# Patient Record
Sex: Male | Born: 1945
Health system: Southern US, Community
[De-identification: ages and names within clinical notes are randomized; demographics above are authoritative.]

## PROBLEM LIST (undated history)

## (undated) DIAGNOSIS — N400 Enlarged prostate without lower urinary tract symptoms: Secondary | ICD-10-CM

## (undated) DIAGNOSIS — M1990 Unspecified osteoarthritis, unspecified site: Secondary | ICD-10-CM

## (undated) DIAGNOSIS — T7840XA Allergy, unspecified, initial encounter: Secondary | ICD-10-CM

## (undated) DIAGNOSIS — E079 Disorder of thyroid, unspecified: Secondary | ICD-10-CM

## (undated) DIAGNOSIS — K2270 Barrett's esophagus without dysplasia: Secondary | ICD-10-CM

## (undated) DIAGNOSIS — N529 Male erectile dysfunction, unspecified: Secondary | ICD-10-CM

## (undated) DIAGNOSIS — H9319 Tinnitus, unspecified ear: Secondary | ICD-10-CM

## (undated) DIAGNOSIS — G709 Myoneural disorder, unspecified: Secondary | ICD-10-CM

## (undated) DIAGNOSIS — M545 Low back pain, unspecified: Secondary | ICD-10-CM

## (undated) DIAGNOSIS — K219 Gastro-esophageal reflux disease without esophagitis: Secondary | ICD-10-CM

## (undated) DIAGNOSIS — I10 Essential (primary) hypertension: Secondary | ICD-10-CM

## (undated) DIAGNOSIS — G8929 Other chronic pain: Secondary | ICD-10-CM

## (undated) DIAGNOSIS — J310 Chronic rhinitis: Secondary | ICD-10-CM

## (undated) HISTORY — DX: Low back pain, unspecified: M54.50

## (undated) HISTORY — DX: Benign prostatic hyperplasia without lower urinary tract symptoms: N40.0

## (undated) HISTORY — DX: Chronic rhinitis: J31.0

## (undated) HISTORY — DX: Allergy, unspecified, initial encounter: T78.40XA

## (undated) HISTORY — DX: Essential (primary) hypertension: I10

## (undated) HISTORY — PX: ESOPHAGECTOMY: SUR457

## (undated) HISTORY — DX: Male erectile dysfunction, unspecified: N52.9

## (undated) HISTORY — DX: Myoneural disorder, unspecified: G70.9

## (undated) HISTORY — DX: Barrett's esophagus without dysplasia: K22.70

## (undated) HISTORY — DX: Disorder of thyroid, unspecified: E07.9

## (undated) HISTORY — DX: Other chronic pain: G89.29

## (undated) HISTORY — PX: EYE SURGERY: SHX253

## (undated) HISTORY — PX: HERNIA REPAIR: SHX51

## (undated) HISTORY — PX: SPINE SURGERY: SHX786

---

## 2009-06-18 DIAGNOSIS — T7840XA Allergy, unspecified, initial encounter: Secondary | ICD-10-CM | POA: Insufficient documentation

## 2011-06-28 DIAGNOSIS — Z23 Encounter for immunization: Secondary | ICD-10-CM | POA: Diagnosis not present

## 2011-10-25 DIAGNOSIS — R21 Rash and other nonspecific skin eruption: Secondary | ICD-10-CM | POA: Diagnosis not present

## 2011-10-25 DIAGNOSIS — D485 Neoplasm of uncertain behavior of skin: Secondary | ICD-10-CM | POA: Diagnosis not present

## 2011-12-31 DIAGNOSIS — R21 Rash and other nonspecific skin eruption: Secondary | ICD-10-CM | POA: Diagnosis not present

## 2011-12-31 DIAGNOSIS — M5137 Other intervertebral disc degeneration, lumbosacral region: Secondary | ICD-10-CM | POA: Diagnosis not present

## 2011-12-31 DIAGNOSIS — M545 Low back pain, unspecified: Secondary | ICD-10-CM | POA: Diagnosis not present

## 2011-12-31 DIAGNOSIS — R7301 Impaired fasting glucose: Secondary | ICD-10-CM | POA: Diagnosis not present

## 2011-12-31 DIAGNOSIS — K219 Gastro-esophageal reflux disease without esophagitis: Secondary | ICD-10-CM | POA: Diagnosis not present

## 2011-12-31 DIAGNOSIS — H9190 Unspecified hearing loss, unspecified ear: Secondary | ICD-10-CM | POA: Diagnosis not present

## 2011-12-31 DIAGNOSIS — E785 Hyperlipidemia, unspecified: Secondary | ICD-10-CM | POA: Diagnosis not present

## 2012-01-08 DIAGNOSIS — H905 Unspecified sensorineural hearing loss: Secondary | ICD-10-CM | POA: Diagnosis not present

## 2012-01-08 DIAGNOSIS — H9319 Tinnitus, unspecified ear: Secondary | ICD-10-CM | POA: Diagnosis not present

## 2012-02-26 DIAGNOSIS — M545 Low back pain, unspecified: Secondary | ICD-10-CM | POA: Diagnosis not present

## 2012-03-03 DIAGNOSIS — M999 Biomechanical lesion, unspecified: Secondary | ICD-10-CM | POA: Diagnosis not present

## 2012-03-03 DIAGNOSIS — M62838 Other muscle spasm: Secondary | ICD-10-CM | POA: Diagnosis not present

## 2012-03-03 DIAGNOSIS — M5137 Other intervertebral disc degeneration, lumbosacral region: Secondary | ICD-10-CM | POA: Diagnosis not present

## 2012-03-10 DIAGNOSIS — M545 Low back pain, unspecified: Secondary | ICD-10-CM | POA: Diagnosis not present

## 2012-03-12 ENCOUNTER — Other Ambulatory Visit: Payer: Self-pay | Admitting: Sports Medicine

## 2012-03-12 DIAGNOSIS — IMO0002 Reserved for concepts with insufficient information to code with codable children: Secondary | ICD-10-CM | POA: Diagnosis not present

## 2012-03-12 DIAGNOSIS — M545 Low back pain, unspecified: Secondary | ICD-10-CM

## 2012-03-12 DIAGNOSIS — M79604 Pain in right leg: Secondary | ICD-10-CM

## 2012-03-14 ENCOUNTER — Ambulatory Visit
Admission: RE | Admit: 2012-03-14 | Discharge: 2012-03-14 | Disposition: A | Discharge status: Home / Self Care | Payer: Medicare Other | Source: Ambulatory Visit | Attending: Sports Medicine | Admitting: Sports Medicine

## 2012-03-14 DIAGNOSIS — M545 Low back pain, unspecified: Secondary | ICD-10-CM | POA: Diagnosis not present

## 2012-03-14 DIAGNOSIS — M79604 Pain in right leg: Secondary | ICD-10-CM

## 2012-03-14 DIAGNOSIS — IMO0002 Reserved for concepts with insufficient information to code with codable children: Secondary | ICD-10-CM | POA: Diagnosis not present

## 2012-03-19 DIAGNOSIS — M5106 Intervertebral disc disorders with myelopathy, lumbar region: Secondary | ICD-10-CM | POA: Diagnosis not present

## 2012-03-21 ENCOUNTER — Inpatient Hospital Stay (HOSPITAL_COMMUNITY): Payer: Medicare Other

## 2012-03-21 ENCOUNTER — Inpatient Hospital Stay (HOSPITAL_COMMUNITY)
Admission: AD | Admit: 2012-03-21 | Discharge: 2012-03-22 | DRG: 491 | Disposition: A | Discharge status: Home / Self Care | Payer: Medicare Other | Source: Ambulatory Visit | Attending: Neurological Surgery | Admitting: Neurological Surgery

## 2012-03-21 ENCOUNTER — Encounter (HOSPITAL_COMMUNITY): Payer: Self-pay | Admitting: Anesthesiology

## 2012-03-21 ENCOUNTER — Encounter (HOSPITAL_COMMUNITY): Payer: Self-pay

## 2012-03-21 ENCOUNTER — Other Ambulatory Visit: Payer: Self-pay | Admitting: Neurological Surgery

## 2012-03-21 DIAGNOSIS — Z01811 Encounter for preprocedural respiratory examination: Secondary | ICD-10-CM | POA: Diagnosis not present

## 2012-03-21 DIAGNOSIS — M5126 Other intervertebral disc displacement, lumbar region: Secondary | ICD-10-CM | POA: Diagnosis not present

## 2012-03-21 DIAGNOSIS — IMO0002 Reserved for concepts with insufficient information to code with codable children: Secondary | ICD-10-CM | POA: Diagnosis not present

## 2012-03-21 DIAGNOSIS — K219 Gastro-esophageal reflux disease without esophagitis: Secondary | ICD-10-CM | POA: Diagnosis not present

## 2012-03-21 HISTORY — DX: Gastro-esophageal reflux disease without esophagitis: K21.9

## 2012-03-21 HISTORY — DX: Tinnitus, unspecified ear: H93.19

## 2012-03-21 HISTORY — DX: Unspecified osteoarthritis, unspecified site: M19.90

## 2012-03-21 LAB — CBC
HCT: 45 % (ref 39.0–52.0)
HCT: 48.2 % (ref 39.0–52.0)
Hemoglobin: 16.4 g/dL (ref 13.0–17.0)
Hemoglobin: 17.4 g/dL — ABNORMAL HIGH (ref 13.0–17.0)
MCH: 32.7 pg (ref 26.0–34.0)
MCH: 32.3 pg (ref 26.0–34.0)
MCHC: 36.4 g/dL — ABNORMAL HIGH (ref 30.0–36.0)
MCHC: 36.1 g/dL — ABNORMAL HIGH (ref 30.0–36.0)
MCV: 89.8 fL (ref 78.0–100.0)
MCV: 89.6 fL (ref 78.0–100.0)
Platelets: 220 10*3/uL (ref 150–400)
Platelets: 240 10*3/uL (ref 150–400)
RBC: 5.01 MIL/uL (ref 4.22–5.81)
RBC: 5.38 MIL/uL (ref 4.22–5.81)
RDW: 12.3 % (ref 11.5–15.5)
RDW: 12.2 % (ref 11.5–15.5)
WBC: 17.7 10*3/uL — ABNORMAL HIGH (ref 4.0–10.5)
WBC: 18.2 10*3/uL — ABNORMAL HIGH (ref 4.0–10.5)

## 2012-03-21 LAB — BASIC METABOLIC PANEL
BUN: 31 mg/dL — ABNORMAL HIGH (ref 6–23)
BUN: 27 mg/dL — ABNORMAL HIGH (ref 6–23)
CO2: 30 mEq/L (ref 19–32)
CO2: 31 mEq/L (ref 19–32)
Calcium: 9.8 mg/dL (ref 8.4–10.5)
Calcium: 9.7 mg/dL (ref 8.4–10.5)
Chloride: 99 mEq/L (ref 96–112)
Chloride: 100 mEq/L (ref 96–112)
Creatinine, Ser: 0.86 mg/dL (ref 0.50–1.35)
Creatinine, Ser: 0.89 mg/dL (ref 0.50–1.35)
GFR calc Af Amer: >90 mL/min (ref >90)
GFR calc Af Amer: >90 mL/min (ref >90)
GFR calc non Af Amer: 89 mL/min — ABNORMAL LOW (ref >90)
GFR calc non Af Amer: 88 mL/min — ABNORMAL LOW (ref >90)
Glucose, Bld: 99 mg/dL (ref 70–99)
Glucose, Bld: 114 mg/dL — ABNORMAL HIGH (ref 70–99)
Potassium: 4.5 mEq/L (ref 3.5–5.1)
Potassium: 4.5 mEq/L (ref 3.5–5.1)
Sodium: 136 mEq/L (ref 135–145)
Sodium: 138 mEq/L (ref 135–145)

## 2012-03-21 LAB — SURGICAL PCR SCREEN
MRSA, PCR: NEGATIVE
Staphylococcus aureus: NEGATIVE

## 2012-03-21 LAB — PROTIME-INR
INR: 1.08 (ref 0.00–1.49)
Prothrombin Time: 13.9 seconds (ref 11.6–15.2)

## 2012-03-21 MED ORDER — DEXAMETHASONE SODIUM PHOSPHATE 10 MG/ML IJ SOLN
INTRAMUSCULAR | Status: AC
  Filled 2012-03-21: qty 1

## 2012-03-21 MED ORDER — SODIUM CHLORIDE 0.9 % IV SOLN
INTRAVENOUS | Status: DC
  Administered 2012-03-21: 22:00:00 via INTRAVENOUS

## 2012-03-21 MED ORDER — MORPHINE SULFATE 2 MG/ML IJ SOLN
2.0000 mg | INTRAMUSCULAR | Status: DC | PRN
  Administered 2012-03-21 – 2012-03-22 (×3): 2 mg via INTRAVENOUS
  Filled 2012-03-21 (×3): qty 1

## 2012-03-21 MED ORDER — OXYCODONE HCL 5 MG PO TABS
5.0000 mg | ORAL_TABLET | ORAL | Status: DC | PRN
  Administered 2012-03-21: 5 mg via ORAL
  Filled 2012-03-21: qty 1

## 2012-03-21 MED ORDER — ONDANSETRON HCL 4 MG/2ML IJ SOLN: 4.0000 mg | Freq: Four times a day (QID) | INTRAMUSCULAR | Status: DC | PRN

## 2012-03-21 MED ORDER — DEXAMETHASONE SODIUM PHOSPHATE 4 MG/ML IJ SOLN
4.0000 mg | Freq: Four times a day (QID) | INTRAMUSCULAR | Status: DC
  Administered 2012-03-21 – 2012-03-22 (×2): 4 mg via INTRAVENOUS
  Filled 2012-03-21 (×3): qty 1

## 2012-03-21 MED ORDER — ACETAMINOPHEN 10 MG/ML IV SOLN
1000.0000 mg | Freq: Four times a day (QID) | INTRAVENOUS | Status: AC
  Administered 2012-03-21 – 2012-03-22 (×4): 1000 mg via INTRAVENOUS
  Filled 2012-03-21 (×4): qty 100

## 2012-03-21 MED ORDER — MUPIROCIN 2 % EX OINT
TOPICAL_OINTMENT | Freq: Once | CUTANEOUS | Status: AC
  Administered 2012-03-21: 1 via NASAL

## 2012-03-21 MED ORDER — CEFAZOLIN SODIUM-DEXTROSE 2-3 GM-% IV SOLR
INTRAVENOUS | Status: AC
  Administered 2012-03-22: 2 g via INTRAVENOUS
  Filled 2012-03-21: qty 50

## 2012-03-21 MED ORDER — CEFAZOLIN SODIUM-DEXTROSE 2-3 GM-% IV SOLR
2.0000 g | INTRAVENOUS | Status: DC
  Filled 2012-03-21: qty 50

## 2012-03-21 MED ORDER — MUPIROCIN 2 % EX OINT
TOPICAL_OINTMENT | CUTANEOUS | Status: AC
  Filled 2012-03-21: qty 22

## 2012-03-21 NOTE — Anesthesia Preprocedure Evaluation (Addendum)
Anesthesia Evaluation  Patient identified by MRN, date of birth, ID band Patient awake    Reviewed: Allergy & Precautions, H&P , NPO status , Patient's Chart, lab work & pertinent test results  Airway Mallampati: I TM Distance: >3 FB Neck ROM: Full    Dental  (+) Teeth Intact and Dental Advisory Given   Pulmonary neg pulmonary ROS,  breath sounds clear to auscultation  Pulmonary exam normal       Cardiovascular negative cardio ROS  Rhythm:Regular Rate:Normal     Neuro/Psych  Neuromuscular disease negative neurological ROS  negative psych ROS   GI/Hepatic Neg liver ROS, GERD-  Medicated and Poorly Controlled,H/o esophagectomy for Barrett's esophagus   Endo/Other  negative endocrine ROS  Renal/GU negative Renal ROS     Musculoskeletal  (+) Arthritis -, Osteoarthritis,    Abdominal   Peds  Hematology negative hematology ROS (+)   Anesthesia Other Findings   Reproductive/Obstetrics                         Anesthesia Physical Anesthesia Plan  ASA: II  Anesthesia Plan: General   Post-op Pain Management:    Induction: Intravenous  Airway Management Planned: Oral ETT  Additional Equipment:   Intra-op Plan:   Post-operative Plan: Extubation in OR  Informed Consent: I have reviewed the patients History and Physical, chart, labs and discussed the procedure including the risks, benefits and alternatives for the proposed anesthesia with the patient or authorized representative who has indicated his/her understanding and acceptance.   Dental advisory given  Plan Discussed with: CRNA and Surgeon  Anesthesia Plan Comments: (Plan routine monitors, GETA)        Anesthesia Quick Evaluation

## 2012-03-21 NOTE — Progress Notes (Signed)
Pt. Reports last ekg was probably done 10+ yrs. Ago, in [place].  Pt. Denies ever having seen a cardiologist.

## 2012-03-21 NOTE — Progress Notes (Signed)
Call to [facility] at Dr. Yetta Barre office, requested that Dr. Caralyn Guile his orders.

## 2012-03-21 NOTE — Preoperative (Addendum)
Beta Blockers   Reason not to administer Beta Blockers:Not Applicable 

## 2012-03-21 NOTE — H&P (Signed)
Subjective: Patient is a 66 y.o. male admitted for R L4-5 extraforaminal microdiskectomy. Onset of symptoms was 2 weeks ago, rapidly worsening since that time.  The pain is rated intense, unremitting, and is located at the left sacroiliac area and radiates to L leg. The pain is described as sharp and throbbing and occurs all day. The symptoms have been progressive. Symptoms are exacerbated by exercise. MRI or CT showed HNP.   Past Medical History  Diagnosis Date  . Arthritis     back, shoulder- both   . Tinnitus     hearing loss in both ears   . GERD (gastroesophageal reflux disease)     Barrett's Esophagus, treated /w esophagectomy - 1998, continues toi have reflux & uses Zantac on a regular basis     Past Surgical History  Procedure Date  . Esophagectomy     1998- at [facility], /w epidural & sedation & pain management /w continued  epidural     Prior to Admission medications   Medication Sig Start Date End Date Taking? Authorizing Provider  acetaminophen (TYLENOL) 500 MG tablet Take 500-1,000 mg by mouth every 6 (six) hours as needed. For pain   Yes Historical Provider, MD  HYDROcodone-acetaminophen (NORCO/VICODIN) 5-325 MG per tablet Take 2 tablets by mouth daily.   Yes Historical Provider, MD  naproxen sodium (ANAPROX) 220 MG tablet Take 220-440 mg by mouth 2 (two) times daily with a meal. Starts with 2 tablets in the morning and takes 1 tablet in the evening   Yes Historical Provider, MD  OVER THE COUNTER MEDICATION Take 2 capsules by mouth daily. URINOZINC (prostate health)   Yes Historical Provider, MD  ranitidine (ZANTAC) 150 MG tablet Take 150 mg by mouth daily.   Yes Historical Provider, MD   Allergies  Allergen Reactions  . Adhesive (Tape) Rash    History  Substance Use Topics  . Smoking status: Never Smoker   . Smokeless tobacco: Not on file  . Alcohol Use: Yes     once a week- 2 beers    History reviewed. No pertinent family history.   Review of Systems  Positive  ROS: neg  All other systems have been reviewed and were otherwise negative with the exception of those mentioned in the HPI and as above.  Objective: Vital signs in last 24 hours: Temp:  [98.3 F (36.8 C)] 98.3 F (36.8 C) (10/04 1221) Pulse Rate:  [91] 91  (10/04 1221) Resp:  [20] 20  (10/04 1221) BP: (162)/(74) 162/74 mmHg (10/04 1221) SpO2:  [96 %] 96 % (10/04 1221) Weight:  [74.844 kg (165 lb)] 74.844 kg (165 lb) (10/04 1258)  General Appearance: Alert, cooperative, no distress, appears stated age Head: Normocephalic, without obvious abnormality, atraumatic Eyes: PERRL, conjunctiva/corneas clear, EOM's intact, fundi benign, both eyes      Ears: Normal TM's and external ear canals, both ears Throat: Lips, mucosa, and tongue normal; teeth and gums normal Neck: Supple, symmetrical, trachea midline, no adenopathy; thyroid: No enlargement/tenderness/nodules; no carotid bruit or JVD Back: Symmetric, no curvature, ROM normal, no CVA tenderness Lungs: Clear to auscultation bilaterally, respirations unlabored Heart: Regular rate and rhythm, S1 and S2 normal, no murmur, rub or gallop Abdomen: Soft, non-tender, bowel sounds active all four quadrants, no masses, no organomegaly Extremities: Extremities normal, atraumatic, no cyanosis or edema Pulses: 2+ and symmetric all extremities Skin: Skin color, texture, turgor normal, no rashes or lesions  NEUROLOGIC:   Mental status: Alert and oriented x4,  no aphasia, good attention  span, fund of knowledge, and memory Motor Exam - grossly normal except L quad Sensory Exam - grossly normal Reflexes: 1+ Coordination - grossly normal Gait - grossly normal Balance - grossly normal Cranial Nerves: I: smell Not tested  II: visual acuity  OS: nl    OD: nl  II: visual fields Full to confrontation  II: pupils Equal, round, reactive to light  III,VII: ptosis None  III,IV,VI: extraocular muscles  Full ROM  V: mastication Normal  V: facial light  touch sensation  Normal  V,VII: corneal reflex  Present  VII: facial muscle function - upper  Normal  VII: facial muscle function - lower Normal  VIII: hearing Not tested  IX: soft palate elevation  Normal  IX,X: gag reflex Present  XI: trapezius strength  5/5  XI: sternocleidomastoid strength 5/5  XI: neck flexion strength  5/5  XII: tongue strength  Normal    Data Review Lab Results  Component Value Date   WBC 17.7* 03/21/2012   HGB 16.4 03/21/2012   HCT 45.0 03/21/2012   MCV 89.8 03/21/2012   PLT 220 03/21/2012   Lab Results  Component Value Date   NA 136 03/21/2012   K 4.5 03/21/2012   CL 99 03/21/2012   CO2 30 03/21/2012   BUN 31* 03/21/2012   CREATININE 0.86 03/21/2012   GLUCOSE 99 03/21/2012   Lab Results  Component Value Date   INR 1.08 03/21/2012    Assessment/Plan: Patient admitted for L 4-5 extraforaminal microdisk. Patient has failed conservative therapy.  I explained the condition and procedure to the patient and answered any questions.  Patient wishes to proceed with procedure as planned. Understands risks/ benefits and typical outcomes of procedure.   Chanique Duca S 03/21/2012 3:44 PM

## 2012-03-22 ENCOUNTER — Inpatient Hospital Stay (HOSPITAL_COMMUNITY): Payer: Medicare Other | Admitting: Anesthesiology

## 2012-03-22 ENCOUNTER — Encounter (HOSPITAL_COMMUNITY)
Admission: AD | Disposition: A | Discharge status: Home / Self Care | Payer: Self-pay | Source: Ambulatory Visit | Attending: Neurological Surgery

## 2012-03-22 ENCOUNTER — Encounter (HOSPITAL_COMMUNITY): Payer: Self-pay | Admitting: Anesthesiology

## 2012-03-22 ENCOUNTER — Inpatient Hospital Stay (HOSPITAL_COMMUNITY): Payer: Medicare Other

## 2012-03-22 DIAGNOSIS — IMO0002 Reserved for concepts with insufficient information to code with codable children: Secondary | ICD-10-CM | POA: Diagnosis not present

## 2012-03-22 DIAGNOSIS — M5126 Other intervertebral disc displacement, lumbar region: Secondary | ICD-10-CM | POA: Diagnosis not present

## 2012-03-22 DIAGNOSIS — M545 Low back pain, unspecified: Secondary | ICD-10-CM | POA: Diagnosis not present

## 2012-03-22 DIAGNOSIS — K219 Gastro-esophageal reflux disease without esophagitis: Secondary | ICD-10-CM | POA: Diagnosis not present

## 2012-03-22 DIAGNOSIS — M519 Unspecified thoracic, thoracolumbar and lumbosacral intervertebral disc disorder: Secondary | ICD-10-CM | POA: Diagnosis not present

## 2012-03-22 HISTORY — PX: LUMBAR LAMINECTOMY/DECOMPRESSION MICRODISCECTOMY: SHX5026

## 2012-03-22 SURGERY — LUMBAR LAMINECTOMY/DECOMPRESSION MICRODISCECTOMY 1 LEVEL
Anesthesia: General | Site: Back | Laterality: Right | Wound class: Clean

## 2012-03-22 MED ORDER — PROMETHAZINE HCL 25 MG/ML IJ SOLN
6.2500 mg | INTRAMUSCULAR | Status: DC | PRN
  Filled 2012-03-22: qty 1

## 2012-03-22 MED ORDER — ACETAMINOPHEN 650 MG RE SUPP: 650.0000 mg | RECTAL | Status: DC | PRN

## 2012-03-22 MED ORDER — MIDAZOLAM HCL 5 MG/5ML IJ SOLN
INTRAMUSCULAR | Status: DC | PRN
  Administered 2012-03-22: 2 mg via INTRAVENOUS

## 2012-03-22 MED ORDER — BUPIVACAINE HCL (PF) 0.25 % IJ SOLN
INTRAMUSCULAR | Status: DC | PRN
  Administered 2012-03-22: 8 mL
  Administered 2012-03-22: 2 mL

## 2012-03-22 MED ORDER — OXYCODONE-ACETAMINOPHEN 5-325 MG PO TABS: 1.0000 | ORAL_TABLET | ORAL | Status: DC | PRN

## 2012-03-22 MED ORDER — FENTANYL CITRATE 0.05 MG/ML IJ SOLN
INTRAMUSCULAR | Status: DC | PRN
  Administered 2012-03-22: 100 ug via INTRAVENOUS

## 2012-03-22 MED ORDER — SODIUM CHLORIDE 0.9 % IJ SOLN: 3.0000 mL | Freq: Two times a day (BID) | INTRAMUSCULAR | Status: DC

## 2012-03-22 MED ORDER — MEPERIDINE HCL 25 MG/ML IJ SOLN: 6.2500 mg | INTRAMUSCULAR | Status: DC | PRN

## 2012-03-22 MED ORDER — 0.9 % SODIUM CHLORIDE (POUR BTL) OPTIME
TOPICAL | Status: DC | PRN
  Administered 2012-03-22: 1000 mL

## 2012-03-22 MED ORDER — THROMBIN 20000 UNITS EX SOLR
CUTANEOUS | Status: DC | PRN
  Administered 2012-03-22: 20000 [IU] via TOPICAL

## 2012-03-22 MED ORDER — HYDROMORPHONE HCL PF 1 MG/ML IJ SOLN
INTRAMUSCULAR | Status: AC
  Filled 2012-03-22: qty 1

## 2012-03-22 MED ORDER — SODIUM CHLORIDE 0.9 % IV SOLN: 250.0000 mL | INTRAVENOUS | Status: DC

## 2012-03-22 MED ORDER — LIDOCAINE HCL 1 % IJ SOLN: INTRAMUSCULAR | Status: DC | PRN

## 2012-03-22 MED ORDER — LIDOCAINE HCL (CARDIAC) 20 MG/ML IV SOLN
INTRAVENOUS | Status: DC | PRN
  Administered 2012-03-22: 30 mg via INTRAVENOUS

## 2012-03-22 MED ORDER — DEXAMETHASONE SODIUM PHOSPHATE 4 MG/ML IJ SOLN
INTRAMUSCULAR | Status: DC | PRN
  Administered 2012-03-22: 10 mg via INTRAVENOUS

## 2012-03-22 MED ORDER — DEXAMETHASONE 4 MG PO TABS
4.0000 mg | ORAL_TABLET | Freq: Four times a day (QID) | ORAL | Status: DC
  Filled 2012-03-22 (×4): qty 1

## 2012-03-22 MED ORDER — SUCCINYLCHOLINE CHLORIDE 20 MG/ML IJ SOLN
INTRAMUSCULAR | Status: DC | PRN
  Administered 2012-03-22: 100 mg via INTRAVENOUS

## 2012-03-22 MED ORDER — DEXAMETHASONE SODIUM PHOSPHATE 4 MG/ML IJ SOLN
4.0000 mg | Freq: Four times a day (QID) | INTRAMUSCULAR | Status: DC
  Administered 2012-03-22: 4 mg via INTRAVENOUS

## 2012-03-22 MED ORDER — MIDAZOLAM HCL 2 MG/2ML IJ SOLN: 0.5000 mg | Freq: Once | INTRAMUSCULAR | Status: DC | PRN

## 2012-03-22 MED ORDER — ONDANSETRON HCL 4 MG/2ML IJ SOLN
INTRAMUSCULAR | Status: DC | PRN
  Administered 2012-03-22: 4 mg via INTRAVENOUS

## 2012-03-22 MED ORDER — CYCLOBENZAPRINE HCL 10 MG PO TABS: 10.0000 mg | ORAL_TABLET | Freq: Three times a day (TID) | ORAL | Status: DC | PRN

## 2012-03-22 MED ORDER — SODIUM CHLORIDE 0.9 % IJ SOLN: 3.0000 mL | INTRAMUSCULAR | Status: DC | PRN

## 2012-03-22 MED ORDER — PHENYLEPHRINE HCL 10 MG/ML IJ SOLN
INTRAMUSCULAR | Status: DC | PRN
  Administered 2012-03-22 (×2): 120 ug via INTRAVENOUS
  Administered 2012-03-22: 80 ug via INTRAVENOUS

## 2012-03-22 MED ORDER — POTASSIUM CHLORIDE IN NACL 20-0.9 MEQ/L-% IV SOLN
INTRAVENOUS | Status: DC
  Administered 2012-03-22: 15:00:00 via INTRAVENOUS
  Filled 2012-03-22 (×2): qty 1000

## 2012-03-22 MED ORDER — PHENOL 1.4 % MT LIQD: 1.0000 | OROMUCOSAL | Status: DC | PRN

## 2012-03-22 MED ORDER — ROCURONIUM BROMIDE 100 MG/10ML IV SOLN
INTRAVENOUS | Status: DC | PRN
  Administered 2012-03-22: 50 mg via INTRAVENOUS

## 2012-03-22 MED ORDER — ONDANSETRON HCL 4 MG/2ML IJ SOLN: 4.0000 mg | INTRAMUSCULAR | Status: DC | PRN

## 2012-03-22 MED ORDER — MENTHOL 3 MG MT LOZG
1.0000 | LOZENGE | OROMUCOSAL | Status: DC | PRN
  Filled 2012-03-22: qty 9

## 2012-03-22 MED ORDER — HYDROMORPHONE HCL PF 1 MG/ML IJ SOLN
0.2500 mg | INTRAMUSCULAR | Status: DC | PRN
  Administered 2012-03-22 (×2): 0.5 mg via INTRAVENOUS

## 2012-03-22 MED ORDER — MORPHINE SULFATE 2 MG/ML IJ SOLN: 1.0000 mg | INTRAMUSCULAR | Status: DC | PRN

## 2012-03-22 MED ORDER — GLYCOPYRROLATE 0.2 MG/ML IJ SOLN
INTRAMUSCULAR | Status: DC | PRN
  Administered 2012-03-22: .8 mg via INTRAVENOUS

## 2012-03-22 MED ORDER — ACETAMINOPHEN 325 MG PO TABS: 650.0000 mg | ORAL_TABLET | ORAL | Status: DC | PRN

## 2012-03-22 MED ORDER — FENTANYL CITRATE 0.05 MG/ML IJ SOLN
INTRAMUSCULAR | Status: DC | PRN
  Administered 2012-03-22: 250 ug via INTRAVENOUS

## 2012-03-22 MED ORDER — NEOSTIGMINE METHYLSULFATE 1 MG/ML IJ SOLN
INTRAMUSCULAR | Status: DC | PRN
  Administered 2012-03-22: 4 mg via INTRAVENOUS

## 2012-03-22 MED ORDER — METHYLPREDNISOLONE ACETATE 80 MG/ML IJ SUSP
INTRAMUSCULAR | Status: DC | PRN
  Administered 2012-03-22: 80 mg

## 2012-03-22 MED ORDER — LACTATED RINGERS IV SOLN
INTRAVENOUS | Status: DC | PRN
  Administered 2012-03-22 (×2): via INTRAVENOUS

## 2012-03-22 MED ORDER — CEFAZOLIN SODIUM 1-5 GM-% IV SOLN
1.0000 g | Freq: Three times a day (TID) | INTRAVENOUS | Status: DC
  Administered 2012-03-22: 1 g via INTRAVENOUS
  Filled 2012-03-22 (×2): qty 50

## 2012-03-22 MED ORDER — HEMOSTATIC AGENTS (NO CHARGE) OPTIME
TOPICAL | Status: DC | PRN
  Administered 2012-03-22 (×2): 1 via TOPICAL

## 2012-03-22 MED ORDER — PROPOFOL 10 MG/ML IV BOLUS
INTRAVENOUS | Status: DC | PRN
  Administered 2012-03-22: 200 mg via INTRAVENOUS

## 2012-03-22 MED ORDER — SODIUM CHLORIDE 0.9 % IR SOLN
Status: DC | PRN
  Administered 2012-03-22: 09:00:00

## 2012-03-22 SURGICAL SUPPLY — 46 items
BAG DECANTER FOR FLEXI CONT (MISCELLANEOUS) ×2 IMPLANT
BENZOIN TINCTURE PRP APPL 2/3 (GAUZE/BANDAGES/DRESSINGS) IMPLANT
BUR MATCHSTICK NEURO 3.0 LAGG (BURR) ×2 IMPLANT
CANISTER SUCTION 2500CC (MISCELLANEOUS) ×2 IMPLANT
CLOTH BEACON ORANGE TIMEOUT ST (SAFETY) ×2 IMPLANT
CONT SPEC 4OZ CLIKSEAL STRL BL (MISCELLANEOUS) ×2 IMPLANT
DERMABOND ADVANCED (GAUZE/BANDAGES/DRESSINGS) ×1
DERMABOND ADVANCED .7 DNX12 (GAUZE/BANDAGES/DRESSINGS) ×1 IMPLANT
DRAPE LAPAROTOMY 100X72X124 (DRAPES) ×2 IMPLANT
DRAPE MICROSCOPE LEICA (MISCELLANEOUS) ×2 IMPLANT
DRAPE MICROSCOPE ZEISS OPMI (DRAPES) IMPLANT
DRAPE POUCH INSTRU U-SHP 10X18 (DRAPES) ×2 IMPLANT
DRAPE SURG 17X23 STRL (DRAPES) ×2 IMPLANT
DRESSING TELFA 8X3 (GAUZE/BANDAGES/DRESSINGS) IMPLANT
DRSG OPSITE 4X5.5 SM (GAUZE/BANDAGES/DRESSINGS) IMPLANT
DURAPREP 26ML APPLICATOR (WOUND CARE) ×2 IMPLANT
ELECT REM PT RETURN 9FT ADLT (ELECTROSURGICAL) ×2
ELECTRODE REM PT RTRN 9FT ADLT (ELECTROSURGICAL) ×1 IMPLANT
GAUZE SPONGE 4X4 16PLY XRAY LF (GAUZE/BANDAGES/DRESSINGS) IMPLANT
GLOVE BIO SURGEON STRL SZ7 (GLOVE) ×2 IMPLANT
GLOVE BIO SURGEON STRL SZ8 (GLOVE) ×2 IMPLANT
GLOVE ECLIPSE 8.0 STRL XLNG CF (GLOVE) ×2 IMPLANT
GLOVE INDICATOR 7.5 STRL GRN (GLOVE) ×2 IMPLANT
GLOVE INDICATOR 8.5 STRL (GLOVE) ×2 IMPLANT
GOWN BRE IMP SLV AUR LG STRL (GOWN DISPOSABLE) ×2 IMPLANT
GOWN BRE IMP SLV AUR XL STRL (GOWN DISPOSABLE) ×4 IMPLANT
GOWN STRL REIN 2XL LVL4 (GOWN DISPOSABLE) IMPLANT
HEMOSTAT POWDER KIT SURGIFOAM (HEMOSTASIS) IMPLANT
KIT BASIN OR (CUSTOM PROCEDURE TRAY) ×2 IMPLANT
KIT ROOM TURNOVER OR (KITS) ×2 IMPLANT
NEEDLE HYPO 25X1 1.5 SAFETY (NEEDLE) ×2 IMPLANT
NEEDLE SPNL 20GX3.5 QUINCKE YW (NEEDLE) ×2 IMPLANT
NS IRRIG 1000ML POUR BTL (IV SOLUTION) ×2 IMPLANT
PACK LAMINECTOMY NEURO (CUSTOM PROCEDURE TRAY) ×2 IMPLANT
PAD ARMBOARD 7.5X6 YLW CONV (MISCELLANEOUS) ×6 IMPLANT
RUBBERBAND STERILE (MISCELLANEOUS) ×4 IMPLANT
SPONGE SURGIFOAM ABS GEL SZ50 (HEMOSTASIS) ×4 IMPLANT
STRIP CLOSURE SKIN 1/2X4 (GAUZE/BANDAGES/DRESSINGS) ×2 IMPLANT
SUT VIC AB 0 CT1 18XCR BRD8 (SUTURE) ×1 IMPLANT
SUT VIC AB 0 CT1 8-18 (SUTURE) ×1
SUT VIC AB 2-0 CP2 18 (SUTURE) ×2 IMPLANT
SUT VIC AB 3-0 SH 8-18 (SUTURE) ×4 IMPLANT
SYR 20ML ECCENTRIC (SYRINGE) ×2 IMPLANT
TOWEL OR 17X24 6PK STRL BLUE (TOWEL DISPOSABLE) ×2 IMPLANT
TOWEL OR 17X26 10 PK STRL BLUE (TOWEL DISPOSABLE) ×2 IMPLANT
WATER STERILE IRR 1000ML POUR (IV SOLUTION) ×2 IMPLANT

## 2012-03-22 NOTE — Anesthesia Procedure Notes (Signed)
Procedure Name: Intubation Date/Time: 03/22/2012 8:15 AM Performed by: Sharlene Dory E Pre-anesthesia Checklist: Patient identified, Emergency Drugs available, Suction available, Patient being monitored and Timeout performed Patient Re-evaluated:Patient Re-evaluated prior to inductionOxygen Delivery Method: Circle system utilized Preoxygenation: Pre-oxygenation with 100% oxygen Intubation Type: Rapid sequence Laryngoscope Size: Mac and 3 Grade View: Grade I Tube type: Oral Tube size: 7.5 mm Number of attempts: 1 Airway Equipment and Method: Stylet Placement Confirmation: ETT inserted through vocal cords under direct vision,  positive ETCO2 and breath sounds checked- equal and bilateral Secured at: 22 cm Tube secured with: Tape Dental Injury: Teeth and Oropharynx as per pre-operative assessment

## 2012-03-22 NOTE — Transfer of Care (Signed)
Immediate Anesthesia Transfer of Care Note  Patient: Jordan [name]  Procedure(s) Performed: Procedure(s) (LRB) with comments: LUMBAR LAMINECTOMY/DECOMPRESSION MICRODISCECTOMY 1 LEVEL (Right) - right lumbar four-five extra-foraminal microdiscectomy  Patient Location: PACU  Anesthesia Type: General  Level of Consciousness: awake, alert  and oriented  Airway & Oxygen Therapy: Patient Spontanous Breathing and Patient connected to nasal cannula oxygen  Post-op Assessment: Report given to PACU RN, Post -op Vital signs reviewed and stable and Patient moving all extremities X 4  Post vital signs: Reviewed and stable  Complications: No apparent anesthesia complications

## 2012-03-22 NOTE — Progress Notes (Signed)
Patient's wife says they have a RW at home  Minor, [name]

## 2012-03-22 NOTE — Progress Notes (Signed)
Ambulated down hallway with RN and rolling walker, tolerated well, stated no pain. Denied dizziness. Patient back to chair with call-bell in reach  Minor, [name]

## 2012-03-22 NOTE — Op Note (Signed)
03/21/2012 - 03/22/2012  9:19 AM  PATIENT:  Jordan [name]  66 y.o. male  PRE-OPERATIVE DIAGNOSIS:  Extraforaminal disc herniation L4-5 on the right with right L4 radiculopathy  POST-OPERATIVE DIAGNOSIS:  Same  PROCEDURE:  Right L4-5 extraforaminal microdiscectomy utilizing microscopic dissection  SURGEON:  Marikay Alar, MD  ASSISTANTS: Dr. Wynetta Emery  ANESTHESIA:   General  EBL: Less than 25 ml  Total I/O In: 1000 [I.V.:1000] Out: -   BLOOD ADMINISTERED:none  DRAINS: None   SPECIMEN:  No Specimen  INDICATION FOR PROCEDURE: This patient presented to my office yesterday with a severe right L4 radiculopathy. MRI showed a herniated disc in the extraforaminal space at L4-5 on the right. Recommended a microdiscectomy. Patient understood the risks, benefits, and alternatives and potential outcomes and wished to proceed.  PROCEDURE DETAILS: The patient was taken to the operating room and after induction of adequate generalized endotracheal anesthesia, the patient was rolled into the prone position on the Wilson frame and all pressure points were padded. The lumbar region was cleaned and then prepped with DuraPrep and draped in the usual sterile fashion. 5 cc of local anesthesia was injected and then a dorsal midline incision was made and carried down to the lumbo sacral fascia. The fascia was opened and the paraspinous musculature was taken down in a subperiosteal fashion to expose L4-5 on the right. Intraoperative x-ray confirmed my level, and then I used a combination of the high-speed drill and Kerrison punch to perform an extraforaminal exposure at L4-5 on the right. I drilled away a small amount of superior part of the L4-5 facet and the lateral part of the pars. The underlying yellow ligament was opened and removed in a piecemeal fashion to expose the underlying exiting L4 nerve root. There were obvious pieces of free fragment in the axilla of the nerve root. He is were removed with I nerve hook  and pituitary rongeur. I then palpated with a coronary dilator along the nerve root and into the foramen to assure adequate decompression. I felt no more compression of the nerve root. I irrigated with saline solution containing bacitracin. Achieved hemostasis with bipolar cautery, lined the dura with Gelfoam, and then closed the fascia with 0 Vicryl. I closed the subcutaneous tissues with 2-0 Vicryl and the subcuticular tissues with 3-0 Vicryl. The skin was then closed with benzoin and Steri-Strips. The drapes were removed, a sterile dressing was applied. The patient was awakened from general anesthesia and transferred to the recovery room in stable condition. At the end of the procedure all sponge, needle and instrument counts were correct.   PLAN OF CARE: Admit to inpatient   PATIENT DISPOSITION:  PACU - hemodynamically stable.   Delay start of Pharmacological VTE agent (>24hrs) due to surgical blood loss or risk of bleeding:  yes

## 2012-03-22 NOTE — Anesthesia Postprocedure Evaluation (Signed)
  Anesthesia Post-op Note  Patient: Jordan [name]  Procedure(s) Performed: Procedure(s) (LRB) with comments: LUMBAR LAMINECTOMY/DECOMPRESSION MICRODISCECTOMY 1 LEVEL (Right) - right lumbar four-five extra-foraminal microdiscectomy  Patient Location: PACU  Anesthesia Type: General  Level of Consciousness: awake and patient cooperative  Airway and Oxygen Therapy: Patient Spontanous Breathing  Post-op Pain: none  Post-op Assessment: Post-op Vital signs reviewed, Patient's Cardiovascular Status Stable, Respiratory Function Stable, Patent Airway, No signs of Nausea or vomiting and Pain level controlled  Post-op Vital Signs: Reviewed and stable  Complications: No apparent anesthesia complications

## 2012-03-22 NOTE — Progress Notes (Signed)
Per nurse tech, patient ambulated again around the unit, tolerated very well. Will cont to monitor  Jordan [name], [name]

## 2012-03-22 NOTE — Progress Notes (Signed)
AVS unavailable to print, physician states he is not coming to the hospital to fill it out. Instructions given to patient to call MD Jones on Monday and set up a follow-up appt. Call MD for fever, draining wound, unrelenting pain, nausea and vomiting. No bending arching twisting of the back.    No equiptment needs identified by RN, and wife says they have a RW at home.   Minor, [name]

## 2012-03-22 NOTE — Progress Notes (Signed)
Patient has voided post surgery  Jordan [name], Jordan [name]

## 2012-03-23 NOTE — Discharge Summary (Signed)
Physician Discharge Summary  Patient ID: Jordan [name] MRN: [id number] DOB/AGE: 12-17-1945 66 y.o.  Admit date: 03/21/2012 Discharge date: 03/23/2012  Admission Diagnoses:Extraforaminal disc herniation L4-5 on the right with right L4 radiculopathy   Discharge Diagnoses: Extraforaminal disc herniation L4-5 on the right with right L4 radiculopathy  Active Problems:  * No active hospital problems. *    Discharged Condition: good  Hospital Course: pt admitted and underwent surgery below  - pt doing well, voiding, ambulating, eating  Consults: None  Significant Diagnostic Studies: none  Treatments: surgery: Right L4-5 extraforaminal microdiscectomy utilizing microscopic dissection   Discharge Exam: Blood pressure 117/69, pulse 79, temperature 97.4 F (36.3 C), temperature source Oral, resp. rate 18, height 5\' 7"  (1.702 m), weight 74.844 kg (165 lb), SpO2 100.00%. Wound:c/d/i  Disposition: home     Medication List     As of 03/23/2012  9:48 AM    ASK your doctor about these medications         acetaminophen 500 MG tablet   Commonly known as: TYLENOL   Take 500-1,000 mg by mouth every 6 (six) hours as needed. For pain      HYDROcodone-acetaminophen 5-325 MG per tablet   Commonly known as: NORCO/VICODIN   Take 2 tablets by mouth daily.      naproxen sodium 220 MG tablet   Commonly known as: ANAPROX   Take 220-440 mg by mouth 2 (two) times daily with a meal. Starts with 2 tablets in the morning and takes 1 tablet in the evening      OVER THE COUNTER MEDICATION   Take 2 capsules by mouth daily. URINOZINC (prostate health)      ranitidine 150 MG tablet   Commonly known as: ZANTAC   Take 150 mg by mouth daily.           Follow-up Information    Follow up with JONES,DAVID S, MD. Schedule an appointment as soon as possible for a visit in 2 weeks.   Contact information:   [address] [place] [address] [phone]           Signed: Clydene Fake, MD 03/23/2012, 9:48 AM

## 2012-03-24 ENCOUNTER — Encounter (HOSPITAL_COMMUNITY): Payer: Self-pay | Admitting: Neurological Surgery

## 2012-12-29 DIAGNOSIS — E663 Overweight: Secondary | ICD-10-CM | POA: Diagnosis not present

## 2012-12-29 DIAGNOSIS — M545 Low back pain, unspecified: Secondary | ICD-10-CM | POA: Diagnosis not present

## 2012-12-29 DIAGNOSIS — Z23 Encounter for immunization: Secondary | ICD-10-CM | POA: Diagnosis not present

## 2012-12-29 DIAGNOSIS — Z0000 Encounter for general adult medical examination without abnormal findings: Secondary | ICD-10-CM | POA: Diagnosis not present

## 2012-12-29 DIAGNOSIS — K2270 Barrett's esophagus without dysplasia: Secondary | ICD-10-CM | POA: Diagnosis not present

## 2012-12-29 DIAGNOSIS — E785 Hyperlipidemia, unspecified: Secondary | ICD-10-CM | POA: Diagnosis not present

## 2013-03-11 DIAGNOSIS — R7301 Impaired fasting glucose: Secondary | ICD-10-CM | POA: Diagnosis not present

## 2013-03-11 DIAGNOSIS — M545 Low back pain, unspecified: Secondary | ICD-10-CM | POA: Diagnosis not present

## 2013-03-11 DIAGNOSIS — M1990 Unspecified osteoarthritis, unspecified site: Secondary | ICD-10-CM | POA: Diagnosis not present

## 2013-03-11 DIAGNOSIS — K219 Gastro-esophageal reflux disease without esophagitis: Secondary | ICD-10-CM | POA: Diagnosis not present

## 2013-03-11 DIAGNOSIS — G609 Hereditary and idiopathic neuropathy, unspecified: Secondary | ICD-10-CM | POA: Diagnosis not present

## 2013-03-11 DIAGNOSIS — Z125 Encounter for screening for malignant neoplasm of prostate: Secondary | ICD-10-CM | POA: Diagnosis not present

## 2014-03-08 DIAGNOSIS — Z1331 Encounter for screening for depression: Secondary | ICD-10-CM | POA: Diagnosis not present

## 2014-03-08 DIAGNOSIS — E663 Overweight: Secondary | ICD-10-CM | POA: Diagnosis not present

## 2014-03-08 DIAGNOSIS — Z0000 Encounter for general adult medical examination without abnormal findings: Secondary | ICD-10-CM | POA: Diagnosis not present

## 2014-03-08 DIAGNOSIS — Z125 Encounter for screening for malignant neoplasm of prostate: Secondary | ICD-10-CM | POA: Diagnosis not present

## 2014-03-08 DIAGNOSIS — E785 Hyperlipidemia, unspecified: Secondary | ICD-10-CM | POA: Diagnosis not present

## 2014-03-08 DIAGNOSIS — R7301 Impaired fasting glucose: Secondary | ICD-10-CM | POA: Diagnosis not present

## 2014-03-08 DIAGNOSIS — G609 Hereditary and idiopathic neuropathy, unspecified: Secondary | ICD-10-CM | POA: Diagnosis not present

## 2014-03-08 DIAGNOSIS — K219 Gastro-esophageal reflux disease without esophagitis: Secondary | ICD-10-CM | POA: Diagnosis not present

## 2014-03-15 DIAGNOSIS — M545 Low back pain, unspecified: Secondary | ICD-10-CM | POA: Diagnosis not present

## 2014-03-15 DIAGNOSIS — K219 Gastro-esophageal reflux disease without esophagitis: Secondary | ICD-10-CM | POA: Diagnosis not present

## 2014-03-15 DIAGNOSIS — R7301 Impaired fasting glucose: Secondary | ICD-10-CM | POA: Diagnosis not present

## 2014-03-15 DIAGNOSIS — Z23 Encounter for immunization: Secondary | ICD-10-CM | POA: Diagnosis not present

## 2014-03-15 DIAGNOSIS — G609 Hereditary and idiopathic neuropathy, unspecified: Secondary | ICD-10-CM | POA: Diagnosis not present

## 2014-04-05 DIAGNOSIS — J069 Acute upper respiratory infection, unspecified: Secondary | ICD-10-CM | POA: Diagnosis not present

## 2014-05-31 DIAGNOSIS — K2270 Barrett's esophagus without dysplasia: Secondary | ICD-10-CM | POA: Diagnosis not present

## 2014-05-31 DIAGNOSIS — K5900 Constipation, unspecified: Secondary | ICD-10-CM | POA: Diagnosis not present

## 2014-05-31 DIAGNOSIS — Z1211 Encounter for screening for malignant neoplasm of colon: Secondary | ICD-10-CM | POA: Diagnosis not present

## 2014-07-01 DIAGNOSIS — K210 Gastro-esophageal reflux disease with esophagitis: Secondary | ICD-10-CM | POA: Diagnosis not present

## 2014-07-01 DIAGNOSIS — K209 Esophagitis, unspecified: Secondary | ICD-10-CM | POA: Diagnosis not present

## 2014-07-01 DIAGNOSIS — K2270 Barrett's esophagus without dysplasia: Secondary | ICD-10-CM | POA: Diagnosis not present

## 2014-07-07 DIAGNOSIS — G609 Hereditary and idiopathic neuropathy, unspecified: Secondary | ICD-10-CM | POA: Diagnosis not present

## 2014-09-30 DIAGNOSIS — R300 Dysuria: Secondary | ICD-10-CM | POA: Diagnosis not present

## 2014-09-30 DIAGNOSIS — N402 Nodular prostate without lower urinary tract symptoms: Secondary | ICD-10-CM | POA: Diagnosis not present

## 2014-10-27 DIAGNOSIS — N402 Nodular prostate without lower urinary tract symptoms: Secondary | ICD-10-CM | POA: Diagnosis not present

## 2014-10-27 DIAGNOSIS — K4090 Unilateral inguinal hernia, without obstruction or gangrene, not specified as recurrent: Secondary | ICD-10-CM | POA: Diagnosis not present

## 2014-11-25 DIAGNOSIS — K4090 Unilateral inguinal hernia, without obstruction or gangrene, not specified as recurrent: Secondary | ICD-10-CM | POA: Diagnosis not present

## 2015-02-01 DIAGNOSIS — K4090 Unilateral inguinal hernia, without obstruction or gangrene, not specified as recurrent: Secondary | ICD-10-CM | POA: Diagnosis not present

## 2015-02-03 ENCOUNTER — Encounter (HOSPITAL_COMMUNITY): Payer: Self-pay | Admitting: Cardiology

## 2015-02-03 ENCOUNTER — Emergency Department (HOSPITAL_COMMUNITY)
Admission: EM | Admit: 2015-02-03 | Discharge: 2015-02-03 | Disposition: A | Discharge status: Home / Self Care | Payer: Medicare Other | Attending: Emergency Medicine | Admitting: Emergency Medicine

## 2015-02-03 DIAGNOSIS — R109 Unspecified abdominal pain: Secondary | ICD-10-CM | POA: Diagnosis not present

## 2015-02-03 DIAGNOSIS — Z79899 Other long term (current) drug therapy: Secondary | ICD-10-CM | POA: Diagnosis not present

## 2015-02-03 DIAGNOSIS — R300 Dysuria: Secondary | ICD-10-CM | POA: Diagnosis present

## 2015-02-03 DIAGNOSIS — M1990 Unspecified osteoarthritis, unspecified site: Secondary | ICD-10-CM | POA: Diagnosis not present

## 2015-02-03 DIAGNOSIS — R339 Retention of urine, unspecified: Secondary | ICD-10-CM | POA: Insufficient documentation

## 2015-02-03 DIAGNOSIS — Z8669 Personal history of other diseases of the nervous system and sense organs: Secondary | ICD-10-CM | POA: Insufficient documentation

## 2015-02-03 DIAGNOSIS — K219 Gastro-esophageal reflux disease without esophagitis: Secondary | ICD-10-CM | POA: Diagnosis not present

## 2015-02-03 DIAGNOSIS — R140 Abdominal distension (gaseous): Secondary | ICD-10-CM | POA: Diagnosis not present

## 2015-02-03 LAB — URINALYSIS, ROUTINE W REFLEX MICROSCOPIC
Bilirubin Urine: NEGATIVE
Glucose, UA: NEGATIVE mg/dL
Ketones, ur: NEGATIVE mg/dL
Leukocytes, UA: NEGATIVE
Nitrite: NEGATIVE
Protein, ur: NEGATIVE mg/dL
Specific Gravity, Urine: 1.007 (ref 1.005–1.030)
Urobilinogen, UA: 0.2 mg/dL (ref 0.0–1.0)
pH: 5 (ref 5.0–8.0)

## 2015-02-03 LAB — URINE MICROSCOPIC-ADD ON

## 2015-02-03 NOTE — ED Provider Notes (Signed)
CSN: [id number]     Arrival date & time 02/03/15  1015 History   First MD Initiated Contact with Patient 02/03/15 1128     Chief Complaint  Patient presents with  . Dysuria     (Consider location/radiation/quality/duration/timing/severity/associated sxs/prior Treatment) Patient is a 69 y.o. male presenting with dysuria. The history is provided by the patient (the pt states he is having difficulty urinating.  he recently had hernia surgry and had a foley).  Dysuria This is a new problem. The current episode started 12 to 24 hours ago. The problem occurs constantly. The problem has not changed since onset.Associated symptoms include abdominal pain. Pertinent negatives include no chest pain and no headaches. Nothing aggravates the symptoms. Nothing relieves the symptoms.    Past Medical History  Diagnosis Date  . Arthritis     back, shoulder- both   . Tinnitus     hearing loss in both ears   . GERD (gastroesophageal reflux disease)     Barrett's Esophagus, treated /w esophagectomy - 1998, continues toi have reflux & uses Zantac on a regular basis    Past Surgical History  Procedure Laterality Date  . Esophagectomy      1998- at [facility], /w epidural & sedation & pain management /w continued  epidural   . Lumbar laminectomy/decompression microdiscectomy  03/22/2012    Procedure: LUMBAR LAMINECTOMY/DECOMPRESSION MICRODISCECTOMY 1 LEVEL;  Surgeon: Eustace Moore, MD;  Location: [facility];  Service: Neurosurgery;  Laterality: Right;  right lumbar four-five extra-foraminal microdiscectomy  . Hernia repair     History reviewed. No pertinent family history. Social History  Substance Use Topics  . Smoking status: Never Smoker   . Smokeless tobacco: None  . Alcohol Use: Yes     Comment: once a week- 2 beers    Review of Systems  Constitutional: Negative for appetite change and fatigue.  HENT: Negative for congestion, ear discharge and sinus pressure.   Eyes: Negative for discharge.   Respiratory: Negative for cough.   Cardiovascular: Negative for chest pain.  Gastrointestinal: Positive for abdominal pain. Negative for diarrhea.       Distended abd,  Mildly tender  Genitourinary: Positive for dysuria. Negative for frequency and hematuria.  Musculoskeletal: Negative for back pain.  Skin: Negative for rash.  Neurological: Negative for seizures and headaches.  Psychiatric/Behavioral: Negative for hallucinations.      Allergies  Adhesive  Home Medications   Prior to Admission medications   Medication Sig Start Date End Date Taking? Authorizing Provider  acetaminophen (TYLENOL) 500 MG tablet Take 500-1,000 mg by mouth every 6 (six) hours as needed. For pain    Historical Provider, MD  HYDROcodone-acetaminophen (NORCO/VICODIN) 5-325 MG per tablet Take 2 tablets by mouth daily.    Historical Provider, MD  naproxen sodium (ANAPROX) 220 MG tablet Take 220-440 mg by mouth 2 (two) times daily with a meal. Starts with 2 tablets in the morning and takes 1 tablet in the evening    Historical Provider, MD  OVER THE COUNTER MEDICATION Take 2 capsules by mouth daily. URINOZINC (prostate health)    Historical Provider, MD  ranitidine (ZANTAC) 150 MG tablet Take 150 mg by mouth daily.    Historical Provider, MD   BP 148/75 mmHg  Pulse 69  Temp(Src) 98.7 F (37.1 C) (Oral)  Resp 18  Wt 165 lb (74.844 kg)  SpO2 95% Physical Exam  Constitutional: He is oriented to person, place, and time. He appears well-developed.  HENT:  Head: Normocephalic.  Eyes: Conjunctivae and EOM are normal. No scleral icterus.  Neck: Neck supple. No thyromegaly present.  Cardiovascular: Normal rate and regular rhythm.  Exam reveals no gallop and no friction rub.   No murmur heard. Pulmonary/Chest: No stridor. He has no wheezes. He has no rales. He exhibits no tenderness.  Abdominal: He exhibits distension. There is tenderness. There is no rebound.  Musculoskeletal: Normal range of motion. He  exhibits no edema.  Lymphadenopathy:    He has no cervical adenopathy.  Neurological: He is oriented to person, place, and time. He exhibits normal muscle tone. Coordination normal.  Skin: No rash noted. No erythema.  Psychiatric: He has a normal mood and affect. His behavior is normal.    ED Course  Procedures (including critical care time) Labs Review Labs Reviewed  URINALYSIS, [place] (NOT AT [facility]) - Abnormal; Notable for the following:    Hgb urine dipstick MODERATE (*)    All other components within normal limits  URINE CULTURE  URINE MICROSCOPIC-ADD ON    Imaging Review No results found. I have personally reviewed and evaluated these images and lab results as part of my medical decision-making.   EKG Interpretation None     Foley placed.  Over 1 liter out MDM   Final diagnoses:  Urinary retention    Urinary retension,  U/a neg,  Will leave foley in,  Culture urine and follow up with urology    Milton Ferguson, MD 02/03/15 1313

## 2015-02-03 NOTE — Discharge Instructions (Signed)
Follow-up with your urologist next week °

## 2015-02-03 NOTE — ED Notes (Signed)
Pt reports that he had hernia surgery on Tuesday and has had difficulty urinating since then. Reports a hx of prostatitis. No fever at home.

## 2015-02-04 DIAGNOSIS — R339 Retention of urine, unspecified: Secondary | ICD-10-CM | POA: Diagnosis not present

## 2015-02-04 LAB — URINE CULTURE
Culture: NO GROWTH
Special Requests: NORMAL

## 2015-02-10 DIAGNOSIS — R339 Retention of urine, unspecified: Secondary | ICD-10-CM | POA: Diagnosis not present

## 2015-03-14 DIAGNOSIS — H2513 Age-related nuclear cataract, bilateral: Secondary | ICD-10-CM | POA: Diagnosis not present

## 2015-04-12 DIAGNOSIS — Z0000 Encounter for general adult medical examination without abnormal findings: Secondary | ICD-10-CM | POA: Diagnosis not present

## 2015-04-12 DIAGNOSIS — M549 Dorsalgia, unspecified: Secondary | ICD-10-CM | POA: Diagnosis not present

## 2015-04-12 DIAGNOSIS — K219 Gastro-esophageal reflux disease without esophagitis: Secondary | ICD-10-CM | POA: Diagnosis not present

## 2015-04-12 DIAGNOSIS — Z23 Encounter for immunization: Secondary | ICD-10-CM | POA: Diagnosis not present

## 2015-04-12 DIAGNOSIS — R7301 Impaired fasting glucose: Secondary | ICD-10-CM | POA: Diagnosis not present

## 2015-04-12 DIAGNOSIS — E785 Hyperlipidemia, unspecified: Secondary | ICD-10-CM | POA: Diagnosis not present

## 2015-04-12 DIAGNOSIS — Z125 Encounter for screening for malignant neoplasm of prostate: Secondary | ICD-10-CM | POA: Diagnosis not present

## 2015-04-27 DIAGNOSIS — N402 Nodular prostate without lower urinary tract symptoms: Secondary | ICD-10-CM | POA: Diagnosis not present

## 2015-05-05 DIAGNOSIS — R338 Other retention of urine: Secondary | ICD-10-CM | POA: Diagnosis not present

## 2015-05-05 DIAGNOSIS — N402 Nodular prostate without lower urinary tract symptoms: Secondary | ICD-10-CM | POA: Diagnosis not present

## 2015-05-26 DIAGNOSIS — K219 Gastro-esophageal reflux disease without esophagitis: Secondary | ICD-10-CM | POA: Diagnosis not present

## 2015-05-26 DIAGNOSIS — M47812 Spondylosis without myelopathy or radiculopathy, cervical region: Secondary | ICD-10-CM | POA: Diagnosis not present

## 2015-05-26 DIAGNOSIS — M542 Cervicalgia: Secondary | ICD-10-CM | POA: Diagnosis not present

## 2015-05-31 DIAGNOSIS — J40 Bronchitis, not specified as acute or chronic: Secondary | ICD-10-CM | POA: Diagnosis not present

## 2015-05-31 DIAGNOSIS — R05 Cough: Secondary | ICD-10-CM | POA: Diagnosis not present

## 2015-06-01 DIAGNOSIS — Z1211 Encounter for screening for malignant neoplasm of colon: Secondary | ICD-10-CM | POA: Diagnosis not present

## 2015-06-01 DIAGNOSIS — K2270 Barrett's esophagus without dysplasia: Secondary | ICD-10-CM | POA: Diagnosis not present

## 2015-08-16 DIAGNOSIS — J329 Chronic sinusitis, unspecified: Secondary | ICD-10-CM | POA: Diagnosis not present

## 2015-09-08 DIAGNOSIS — R42 Dizziness and giddiness: Secondary | ICD-10-CM | POA: Diagnosis not present

## 2015-09-08 DIAGNOSIS — J302 Other seasonal allergic rhinitis: Secondary | ICD-10-CM | POA: Diagnosis not present

## 2015-12-06 DIAGNOSIS — N402 Nodular prostate without lower urinary tract symptoms: Secondary | ICD-10-CM | POA: Diagnosis not present

## 2015-12-13 DIAGNOSIS — K208 Other esophagitis: Secondary | ICD-10-CM | POA: Diagnosis not present

## 2015-12-13 DIAGNOSIS — K3189 Other diseases of stomach and duodenum: Secondary | ICD-10-CM | POA: Diagnosis not present

## 2015-12-13 DIAGNOSIS — K5730 Diverticulosis of large intestine without perforation or abscess without bleeding: Secondary | ICD-10-CM | POA: Diagnosis not present

## 2015-12-13 DIAGNOSIS — K2270 Barrett's esophagus without dysplasia: Secondary | ICD-10-CM | POA: Diagnosis not present

## 2015-12-13 DIAGNOSIS — Z1211 Encounter for screening for malignant neoplasm of colon: Secondary | ICD-10-CM | POA: Diagnosis not present

## 2015-12-29 DIAGNOSIS — R972 Elevated prostate specific antigen [PSA]: Secondary | ICD-10-CM | POA: Diagnosis not present

## 2016-05-21 DIAGNOSIS — E785 Hyperlipidemia, unspecified: Secondary | ICD-10-CM | POA: Diagnosis not present

## 2016-05-21 DIAGNOSIS — Z0000 Encounter for general adult medical examination without abnormal findings: Secondary | ICD-10-CM | POA: Diagnosis not present

## 2016-05-21 DIAGNOSIS — R7309 Other abnormal glucose: Secondary | ICD-10-CM | POA: Diagnosis not present

## 2016-05-21 DIAGNOSIS — K219 Gastro-esophageal reflux disease without esophagitis: Secondary | ICD-10-CM | POA: Diagnosis not present

## 2016-05-21 DIAGNOSIS — Z125 Encounter for screening for malignant neoplasm of prostate: Secondary | ICD-10-CM | POA: Diagnosis not present

## 2016-05-29 DIAGNOSIS — E785 Hyperlipidemia, unspecified: Secondary | ICD-10-CM | POA: Diagnosis not present

## 2016-05-29 DIAGNOSIS — M545 Low back pain: Secondary | ICD-10-CM | POA: Diagnosis not present

## 2016-05-29 DIAGNOSIS — K219 Gastro-esophageal reflux disease without esophagitis: Secondary | ICD-10-CM | POA: Diagnosis not present

## 2016-05-29 DIAGNOSIS — K2270 Barrett's esophagus without dysplasia: Secondary | ICD-10-CM | POA: Diagnosis not present

## 2016-06-04 DIAGNOSIS — D485 Neoplasm of uncertain behavior of skin: Secondary | ICD-10-CM | POA: Diagnosis not present

## 2016-06-04 DIAGNOSIS — D225 Melanocytic nevi of trunk: Secondary | ICD-10-CM | POA: Diagnosis not present

## 2016-06-04 DIAGNOSIS — Z1283 Encounter for screening for malignant neoplasm of skin: Secondary | ICD-10-CM | POA: Diagnosis not present

## 2016-06-18 DIAGNOSIS — G629 Polyneuropathy, unspecified: Secondary | ICD-10-CM | POA: Insufficient documentation

## 2016-06-18 HISTORY — DX: Polyneuropathy, unspecified: G62.9

## 2016-06-21 DIAGNOSIS — D485 Neoplasm of uncertain behavior of skin: Secondary | ICD-10-CM | POA: Diagnosis not present

## 2016-06-21 DIAGNOSIS — L988 Other specified disorders of the skin and subcutaneous tissue: Secondary | ICD-10-CM | POA: Diagnosis not present

## 2016-07-23 DIAGNOSIS — M545 Low back pain, unspecified: Secondary | ICD-10-CM | POA: Insufficient documentation

## 2016-07-25 DIAGNOSIS — R972 Elevated prostate specific antigen [PSA]: Secondary | ICD-10-CM | POA: Diagnosis not present

## 2016-07-31 DIAGNOSIS — R3912 Poor urinary stream: Secondary | ICD-10-CM | POA: Diagnosis not present

## 2016-07-31 DIAGNOSIS — N403 Nodular prostate with lower urinary tract symptoms: Secondary | ICD-10-CM | POA: Diagnosis not present

## 2016-07-31 DIAGNOSIS — R972 Elevated prostate specific antigen [PSA]: Secondary | ICD-10-CM | POA: Diagnosis not present

## 2016-07-31 DIAGNOSIS — N5201 Erectile dysfunction due to arterial insufficiency: Secondary | ICD-10-CM | POA: Diagnosis not present

## 2016-09-28 DIAGNOSIS — S00431A Contusion of right ear, initial encounter: Secondary | ICD-10-CM | POA: Diagnosis not present

## 2016-09-28 DIAGNOSIS — S01111A Laceration without foreign body of right eyelid and periocular area, initial encounter: Secondary | ICD-10-CM | POA: Diagnosis not present

## 2016-11-07 DIAGNOSIS — H40052 Ocular hypertension, left eye: Secondary | ICD-10-CM | POA: Diagnosis not present

## 2016-11-07 DIAGNOSIS — H2513 Age-related nuclear cataract, bilateral: Secondary | ICD-10-CM | POA: Diagnosis not present

## 2016-11-07 DIAGNOSIS — H40013 Open angle with borderline findings, low risk, bilateral: Secondary | ICD-10-CM | POA: Diagnosis not present

## 2016-11-07 DIAGNOSIS — H25042 Posterior subcapsular polar age-related cataract, left eye: Secondary | ICD-10-CM | POA: Diagnosis not present

## 2016-11-07 DIAGNOSIS — H25013 Cortical age-related cataract, bilateral: Secondary | ICD-10-CM | POA: Diagnosis not present

## 2016-11-20 DIAGNOSIS — H40013 Open angle with borderline findings, low risk, bilateral: Secondary | ICD-10-CM | POA: Diagnosis not present

## 2016-11-20 DIAGNOSIS — H2512 Age-related nuclear cataract, left eye: Secondary | ICD-10-CM | POA: Diagnosis not present

## 2016-11-20 DIAGNOSIS — H2513 Age-related nuclear cataract, bilateral: Secondary | ICD-10-CM | POA: Diagnosis not present

## 2016-11-26 DIAGNOSIS — K2270 Barrett's esophagus without dysplasia: Secondary | ICD-10-CM | POA: Diagnosis not present

## 2016-12-05 DIAGNOSIS — H25812 Combined forms of age-related cataract, left eye: Secondary | ICD-10-CM | POA: Diagnosis not present

## 2016-12-05 DIAGNOSIS — H2512 Age-related nuclear cataract, left eye: Secondary | ICD-10-CM | POA: Diagnosis not present

## 2016-12-11 DIAGNOSIS — H25011 Cortical age-related cataract, right eye: Secondary | ICD-10-CM | POA: Diagnosis not present

## 2016-12-11 DIAGNOSIS — H2511 Age-related nuclear cataract, right eye: Secondary | ICD-10-CM | POA: Diagnosis not present

## 2016-12-26 DIAGNOSIS — H25811 Combined forms of age-related cataract, right eye: Secondary | ICD-10-CM | POA: Diagnosis not present

## 2016-12-26 DIAGNOSIS — H2511 Age-related nuclear cataract, right eye: Secondary | ICD-10-CM | POA: Diagnosis not present

## 2017-01-29 DIAGNOSIS — K2270 Barrett's esophagus without dysplasia: Secondary | ICD-10-CM | POA: Diagnosis not present

## 2017-01-29 DIAGNOSIS — K210 Gastro-esophageal reflux disease with esophagitis: Secondary | ICD-10-CM | POA: Diagnosis not present

## 2017-01-29 DIAGNOSIS — K208 Other esophagitis: Secondary | ICD-10-CM | POA: Diagnosis not present

## 2017-04-23 DIAGNOSIS — J209 Acute bronchitis, unspecified: Secondary | ICD-10-CM | POA: Diagnosis not present

## 2017-04-23 DIAGNOSIS — K22719 Barrett's esophagus with dysplasia, unspecified: Secondary | ICD-10-CM | POA: Diagnosis not present

## 2017-04-23 DIAGNOSIS — N401 Enlarged prostate with lower urinary tract symptoms: Secondary | ICD-10-CM | POA: Diagnosis not present

## 2017-04-23 DIAGNOSIS — M5126 Other intervertebral disc displacement, lumbar region: Secondary | ICD-10-CM | POA: Diagnosis not present

## 2017-05-06 DIAGNOSIS — H029 Unspecified disorder of eyelid: Secondary | ICD-10-CM | POA: Diagnosis not present

## 2017-05-06 DIAGNOSIS — H40013 Open angle with borderline findings, low risk, bilateral: Secondary | ICD-10-CM | POA: Diagnosis not present

## 2017-05-24 DIAGNOSIS — M5126 Other intervertebral disc displacement, lumbar region: Secondary | ICD-10-CM | POA: Diagnosis not present

## 2017-05-24 DIAGNOSIS — E782 Mixed hyperlipidemia: Secondary | ICD-10-CM | POA: Diagnosis not present

## 2017-05-24 DIAGNOSIS — N503 Cyst of epididymis: Secondary | ICD-10-CM | POA: Diagnosis not present

## 2017-05-24 DIAGNOSIS — Z0000 Encounter for general adult medical examination without abnormal findings: Secondary | ICD-10-CM | POA: Diagnosis not present

## 2017-05-24 DIAGNOSIS — K22719 Barrett's esophagus with dysplasia, unspecified: Secondary | ICD-10-CM | POA: Diagnosis not present

## 2017-06-18 HISTORY — PX: CATARACT EXTRACTION: SUR2

## 2017-10-22 DIAGNOSIS — N500 Atrophy of testis: Secondary | ICD-10-CM | POA: Diagnosis not present

## 2017-10-22 DIAGNOSIS — N4341 Spermatocele of epididymis, single: Secondary | ICD-10-CM | POA: Diagnosis not present

## 2017-10-22 DIAGNOSIS — R972 Elevated prostate specific antigen [PSA]: Secondary | ICD-10-CM | POA: Diagnosis not present

## 2017-10-22 DIAGNOSIS — N402 Nodular prostate without lower urinary tract symptoms: Secondary | ICD-10-CM | POA: Diagnosis not present

## 2017-10-22 DIAGNOSIS — N5201 Erectile dysfunction due to arterial insufficiency: Secondary | ICD-10-CM | POA: Diagnosis not present

## 2017-11-12 DIAGNOSIS — R0602 Shortness of breath: Secondary | ICD-10-CM | POA: Diagnosis not present

## 2017-11-12 DIAGNOSIS — R05 Cough: Secondary | ICD-10-CM | POA: Diagnosis not present

## 2017-11-12 DIAGNOSIS — R062 Wheezing: Secondary | ICD-10-CM | POA: Diagnosis not present

## 2017-11-12 DIAGNOSIS — K219 Gastro-esophageal reflux disease without esophagitis: Secondary | ICD-10-CM | POA: Diagnosis not present

## 2018-01-29 DIAGNOSIS — K2270 Barrett's esophagus without dysplasia: Secondary | ICD-10-CM | POA: Diagnosis not present

## 2018-02-04 DIAGNOSIS — K210 Gastro-esophageal reflux disease with esophagitis: Secondary | ICD-10-CM | POA: Diagnosis not present

## 2018-02-04 DIAGNOSIS — K2270 Barrett's esophagus without dysplasia: Secondary | ICD-10-CM | POA: Diagnosis not present

## 2018-02-04 DIAGNOSIS — K208 Other esophagitis: Secondary | ICD-10-CM | POA: Diagnosis not present

## 2018-03-18 DIAGNOSIS — D1724 Benign lipomatous neoplasm of skin and subcutaneous tissue of left leg: Secondary | ICD-10-CM | POA: Diagnosis not present

## 2018-03-21 DIAGNOSIS — L820 Inflamed seborrheic keratosis: Secondary | ICD-10-CM | POA: Diagnosis not present

## 2018-03-21 DIAGNOSIS — Z1283 Encounter for screening for malignant neoplasm of skin: Secondary | ICD-10-CM | POA: Diagnosis not present

## 2018-03-21 DIAGNOSIS — D225 Melanocytic nevi of trunk: Secondary | ICD-10-CM | POA: Diagnosis not present

## 2018-06-27 DIAGNOSIS — R350 Frequency of micturition: Secondary | ICD-10-CM | POA: Diagnosis not present

## 2018-06-27 DIAGNOSIS — R198 Other specified symptoms and signs involving the digestive system and abdomen: Secondary | ICD-10-CM | POA: Diagnosis not present

## 2018-06-27 DIAGNOSIS — J069 Acute upper respiratory infection, unspecified: Secondary | ICD-10-CM | POA: Diagnosis not present

## 2018-07-04 DIAGNOSIS — K22719 Barrett's esophagus with dysplasia, unspecified: Secondary | ICD-10-CM | POA: Diagnosis not present

## 2018-07-04 DIAGNOSIS — E782 Mixed hyperlipidemia: Secondary | ICD-10-CM | POA: Diagnosis not present

## 2018-07-04 DIAGNOSIS — N401 Enlarged prostate with lower urinary tract symptoms: Secondary | ICD-10-CM | POA: Diagnosis not present

## 2018-07-04 DIAGNOSIS — Z0000 Encounter for general adult medical examination without abnormal findings: Secondary | ICD-10-CM | POA: Diagnosis not present

## 2018-07-04 DIAGNOSIS — M5126 Other intervertebral disc displacement, lumbar region: Secondary | ICD-10-CM | POA: Diagnosis not present

## 2018-07-11 DIAGNOSIS — N401 Enlarged prostate with lower urinary tract symptoms: Secondary | ICD-10-CM | POA: Diagnosis not present

## 2018-07-11 DIAGNOSIS — K22719 Barrett's esophagus with dysplasia, unspecified: Secondary | ICD-10-CM | POA: Diagnosis not present

## 2018-07-11 DIAGNOSIS — E782 Mixed hyperlipidemia: Secondary | ICD-10-CM | POA: Diagnosis not present

## 2018-07-11 DIAGNOSIS — Z131 Encounter for screening for diabetes mellitus: Secondary | ICD-10-CM | POA: Diagnosis not present

## 2018-07-11 DIAGNOSIS — M5126 Other intervertebral disc displacement, lumbar region: Secondary | ICD-10-CM | POA: Diagnosis not present

## 2018-07-11 DIAGNOSIS — Z0000 Encounter for general adult medical examination without abnormal findings: Secondary | ICD-10-CM | POA: Diagnosis not present

## 2018-07-28 DIAGNOSIS — H33312 Horseshoe tear of retina without detachment, left eye: Secondary | ICD-10-CM | POA: Diagnosis not present

## 2018-07-28 DIAGNOSIS — H43812 Vitreous degeneration, left eye: Secondary | ICD-10-CM | POA: Diagnosis not present

## 2018-07-28 DIAGNOSIS — H5319 Other subjective visual disturbances: Secondary | ICD-10-CM | POA: Diagnosis not present

## 2018-07-28 DIAGNOSIS — H43392 Other vitreous opacities, left eye: Secondary | ICD-10-CM | POA: Diagnosis not present

## 2018-07-29 ENCOUNTER — Encounter (INDEPENDENT_AMBULATORY_CARE_PROVIDER_SITE_OTHER): Payer: Self-pay | Admitting: Ophthalmology

## 2018-07-29 ENCOUNTER — Ambulatory Visit (INDEPENDENT_AMBULATORY_CARE_PROVIDER_SITE_OTHER): Payer: Medicare Other | Admitting: Ophthalmology

## 2018-07-29 DIAGNOSIS — H35371 Puckering of macula, right eye: Secondary | ICD-10-CM

## 2018-07-29 DIAGNOSIS — H3322 Serous retinal detachment, left eye: Secondary | ICD-10-CM | POA: Diagnosis not present

## 2018-07-29 DIAGNOSIS — Z961 Presence of intraocular lens: Secondary | ICD-10-CM | POA: Diagnosis not present

## 2018-07-29 DIAGNOSIS — H35033 Hypertensive retinopathy, bilateral: Secondary | ICD-10-CM | POA: Diagnosis not present

## 2018-07-29 DIAGNOSIS — H33312 Horseshoe tear of retina without detachment, left eye: Secondary | ICD-10-CM | POA: Diagnosis not present

## 2018-07-29 DIAGNOSIS — H3581 Retinal edema: Secondary | ICD-10-CM

## 2018-07-29 DIAGNOSIS — I10 Essential (primary) hypertension: Secondary | ICD-10-CM

## 2018-07-29 MED ORDER — NEOMYCIN-POLYMYXIN-DEXAMETH 3.5-10000-0.1 OP OINT
TOPICAL_OINTMENT | Freq: Four times a day (QID) | OPHTHALMIC | Status: DC
Start: 2018-07-29 — End: 2018-07-29

## 2018-07-29 MED ORDER — NEOMYCIN-POLYMYXIN-DEXAMETH 3.5-10000-0.1 OP OINT: 1.0000 "application " | TOPICAL_OINTMENT | Freq: Four times a day (QID) | OPHTHALMIC | 2 refills | Status: DC

## 2018-07-29 NOTE — Progress Notes (Signed)
[facility] Note  07/29/2018     CHIEF COMPLAINT Patient presents for Retina Evaluation   HISTORY OF PRESENT ILLNESS: Jordan [name] is a 73 y.o. male who presents to the clinic today for:   HPI    Retina Evaluation    In left eye.  This started 1 month ago.  Duration of 1 month.  Associated Symptoms Flashes and Floaters.  Negative for Blind Spot, Photophobia, Scalp Tenderness, Fever, Pain, Glare, Jaw Claudication, Weight Loss, Distortion, Redness, Trauma, Shoulder/Hip pain and Fatigue.  Context:  distance vision, mid-range vision and near vision.  Treatments tried include no treatments.  Response to treatment was mild improvement.          Comments    Patient c/o floaters and flashes OS starting 1 month ago. Associated with coughing and congestion. Treated with prednisone. Patient feeling better. Floaters and flashes improving some, too.        Last edited by Roselee Nova D on 07/29/2018  1:20 PM. (History)    pt states he is a regular pt of Dr. [name], he states he did his cataract sx about a year ago, he states about a month ago he had severe chest congestion and did a lot of coughing, he states after he got better he was in the car and noticed flashes of lights and several new floaters in his left eye, he states this has gradually gotten better  Referring physician: Hortencia Pilar, MD [place], [place] [address]  HISTORICAL INFORMATION:   Selected notes from the [facility] Referred by Dr. [name] for concern of HST LEE: 02.10.20 [name]: OD: 20/25 OS: 20/25-] Ocular Hx-glaucoma, HTN ret, pseudo PMH-arthritis, hx of shingles    CURRENT MEDICATIONS: Current Outpatient Medications (Ophthalmic Drugs)  Medication Sig  . neomycin-polymyxin b-dexamethasone (MAXITROL) 3.5-10000-0.1 OINT Place 1 application into the left eye 4 (four) times daily.   No current facility-administered medications  for this visit.  (Ophthalmic Drugs)   Current Outpatient Medications (Other)  Medication Sig  . acetaminophen (TYLENOL) 500 MG tablet Take 500-1,000 mg by mouth every 6 (six) hours as needed. For pain  . HYDROcodone-acetaminophen (NORCO/VICODIN) 5-325 MG per tablet Take 2 tablets by mouth daily.  . naproxen sodium (ANAPROX) 220 MG tablet Take 220-440 mg by mouth 2 (two) times daily with a meal. Starts with 2 tablets in the morning and takes 1 tablet in the evening  . OVER THE COUNTER MEDICATION Take 2 capsules by mouth daily. URINOZINC (prostate health)  . saw palmetto 500 MG capsule Take 500 mg by mouth 2 (two) times daily.  . sildenafil (REVATIO) 20 MG tablet Take 20 mg by mouth as needed.  . ranitidine (ZANTAC) 150 MG tablet Take 150 mg by mouth daily.   No current facility-administered medications for this visit.  (Other)      REVIEW OF SYSTEMS: ROS    Positive for: Eyes   Negative for: Constitutional, Gastrointestinal, Neurological, Skin, Genitourinary, Musculoskeletal, HENT, Endocrine, Cardiovascular, Respiratory, Psychiatric, Allergic/Imm, Heme/Lymph   Last edited by Roselee Nova D on 07/29/2018  1:20 PM. (History)       ALLERGIES Allergies  Allergen Reactions  . Adhesive [Tape] Rash    PAST MEDICAL HISTORY Past Medical History:  Diagnosis Date  . Arthritis    back, shoulder- both   . GERD (gastroesophageal reflux disease)    Barrett's Esophagus, treated /w esophagectomy - 1998, continues toi have reflux & uses  Zantac on a regular basis   . Tinnitus    hearing loss in both ears    Past Surgical History:  Procedure Laterality Date  . CATARACT EXTRACTION Bilateral 2019   Dr. Kathlen Mody  . ESOPHAGECTOMY     1998- at [facility], /w epidural & sedation & pain management /w continued  epidural   . HERNIA REPAIR    . LUMBAR LAMINECTOMY/DECOMPRESSION MICRODISCECTOMY  03/22/2012   Procedure: LUMBAR LAMINECTOMY/DECOMPRESSION MICRODISCECTOMY 1 LEVEL;  Surgeon: Eustace Moore, MD;   Location: [facility];  Service: Neurosurgery;  Laterality: Right;  right lumbar four-five extra-foraminal microdiscectomy    FAMILY HISTORY History reviewed. No pertinent family history.  SOCIAL HISTORY Social History   Tobacco Use  . Smoking status: Never Smoker  . Smokeless tobacco: Never Used  Substance Use Topics  . Alcohol use: Yes    Comment: once a week- 2 beers  . Drug use: No         OPHTHALMIC EXAM:  Base Eye Exam    Visual Acuity (Snellen - Linear)      Right Left   Dist cc 20/25 +1 20/25 +2   Dist ph cc 20/25 20/20 -1   Correction:  Glasses       Tonometry (Tonopen, 1:27 PM)      Right Left   Pressure 18 19       Pupils      Dark Light Shape React APD   Right 3 2 Round Slow None   Left 3 2 Round Slow None       Visual Fields (Counting fingers)      Left Right    Full Full       Extraocular Movement      Right Left    Full, Ortho Full, Ortho       Neuro/Psych    Oriented x3:  Yes   Mood/Affect:  Normal       Dilation    Both eyes:  1.0% Mydriacyl, 2.5% Phenylephrine @ 1:27 PM        Slit Lamp and Fundus Exam    Slit Lamp Exam      Right Left   Lids/Lashes Dermatochalasis - upper lid Dermatochalasis - upper lid   Conjunctiva/Sclera White and quiet White and quiet   Cornea Arcus, Well healed cataract wounds, 1+ Punctate epithelial erosions Arcus, Well healed cataract wounds, 1+ Punctate epithelial erosions   Anterior Chamber deep and clear deep and clear   Iris Round and dilated Round and dilated   Lens PC IOL in good position, trace Posterior capsular opacification PC IOL in good position, trace Posterior capsular opacification   Vitreous mild Vitreous syneresis, Posterior vitreous detachment mild Vitreous syneresis, no tobacco dusting, Posterior vitreous detachment       Fundus Exam      Right Left   Disc mild tilt Pink and Sharp, mild temporal Peripapillary atrophy   C/D Ratio 0.3 0.4   Macula Good foveal reflex, Epiretinal  membrane with striae nasal macula, no heme or edema Good foveal reflex, mildRetinal pigment epithelial mottling, No heme or edema   Vessels Vascular attenuation Vascular attenuation   Periphery Attached, no heme  Attached, HST at 0130 almost to ora with cuff of SRF surrounding        Refraction    Wearing Rx      Sphere Cylinder Axis Add   Right -1.00 +2.25 078 +2.50   Left -1.75 +1.00 115 +2.50   Age:  1 yr  Type:  PAL       Manifest Refraction      Sphere Cylinder Axis Dist VA   Right -0.50 +2.00 080 20/25   Left -1.50 +1.00 115 20/25+2          IMAGING AND PROCEDURES  Imaging and Procedures for @TODAY @  OCT, Retina - OU - Both Eyes       Right Eye Quality was good. Central Foveal Thickness: 271. Progression has no prior data. Findings include normal foveal contour, no SRF, no IRF, epiretinal membrane, macular pucker (Nasal ERM with pucker).   Left Eye Quality was good. Central Foveal Thickness: 266. Progression has no prior data. Findings include normal foveal contour, no SRF, no IRF.   Notes *Images captured and stored on drive  Diagnosis / Impression:  NFP, No IRF/SRF OD: nasal ERM with pucker   Clinical management:  See below  Abbreviations: NFP - Normal foveal profile. CME - cystoid macular edema. PED - pigment epithelial detachment. IRF - intraretinal fluid. SRF - subretinal fluid. EZ - ellipsoid zone. ERM - epiretinal membrane. ORA - outer retinal atrophy. ORT - outer retinal tubulation. SRHM - subretinal hyper-reflective material        Repair Retinal Breaks, Cryotherapy - OS - Left Eye       CRYOPEXY PROCEDURE NOTE  Preoperative Diagnosis:       Retinal Tear with focal retinal detachment, LEFT EYE Postoperative Diagnosis:     Same  Surgeon:              Bernarda Caffey, M.D./Ph.D. Assistant:   [name], L.P.N. Anesthesia Type:            Subconjunctival lidocaine injection   The patient's LEFT eye was marked and a timeout was performed  to verify the correct patient, the correct site, and correct procedure. One of drop of Proparacaine was given in the operative eye. A local subconjunctival block was performed utilizing 2% Lidocaine, using a 30 gauge needle from 11-4 clock hours. A wire eyelid speculum was inserted into the operative eye. Using indirect ophthalmoloscopy, the retinal tears/breaks were identified. Cryotherapy was appropriately utilized in the areas of retinal tear at the 130 clock hour.  A small amount of Tobradex ointment was instilled to the operative eye and the eye was patched.  The patient was given a prescription for Maxitrol ointment to be used QID in the operative eye until the next visit.                 ASSESSMENT/PLAN:    ICD-10-CM   1. Retinal tear of left eye H33.312 Repair Retinal Breaks, Cryotherapy - OS - Left Eye  2. Left retinal detachment H33.22 Repair Retinal Breaks, Cryotherapy - OS - Left Eye  3. Epiretinal membrane (ERM) of right eye H35.371   4. Essential hypertension I10   5. Hypertensive retinopathy of both eyes H35.033   6. Retinal edema H35.81 OCT, Retina - OU - Both Eyes  7. Pseudophakia of both eyes Z96.1     1,2. Horseshoe Tear with surrounding cuff of SRF / focal RD, OS  - located at 0130 with surrounding cuff of SRF  - discussed findings, prognosis and treatment options  - recommend retinal cryopexy today, 02.11.2020  - RBA of procedure discussed, questions answered  - informed consent obtained and signed  - see procedure note  - start maxitrol QID OS x7 days  - f/u 1 week  3. Epiretinal membrane, OD  - The natural history, anatomy, potential  for loss of vision, and treatment options including vitrectomy techniques and the complications of endophthalmitis, retinal detachment, vitreous hemorrhage, cataract progression and permanent vision loss discussed with the patient.  - nasal ERM w/ early pucker -- mild  - asymptomatic, no metamorphopsia  - no indication for  surgery at this time  - monitor for now  4,5. Hypertensive retinopathy OU - discussed importance of tight BP control - monitor  6. No retinal edema on exam or OCT  7. Pseudophakia OU  - s/p CE/IOL OU (2019) by expert surgeon, Dr. [name]  - beautiful surgeries, doing well  - monitor   Ophthalmic Meds Ordered this visit:  Meds ordered this encounter  Medications  . DISCONTD: neomycin-polymyxin b-dexamethasone (MAXITROL) ophthalmic ointment  . neomycin-polymyxin b-dexamethasone (MAXITROL) 3.5-10000-0.1 OINT    Sig: Place 1 application into the left eye 4 (four) times daily.    Dispense:  3.5 g    Refill:  2       Return in about 1 week (around 08/05/2018) for POV - RT OS s/p cryo 2.11.2020.  There are no Patient Instructions on file for this visit.   Explained the diagnoses, plan, and follow up with the patient and they expressed understanding.  Patient expressed understanding of the importance of proper follow up care.   This document serves as a record of services personally performed by Gardiner Sleeper, MD, PhD. It was created on their behalf by Ernest Mallick, OA, an ophthalmic assistant. The creation of this record is the provider's dictation and/or activities during the visit.    Electronically signed by: Ernest Mallick, OA  02.11.2020 6:18 PM    Gardiner Sleeper, M.D., Ph.D. Diseases & Surgery of the Retina and Vitreous [facility]   I have reviewed the above documentation for accuracy and completeness, and I agree with the above. Gardiner Sleeper, M.D., Ph.D. 07/29/18 6:18 PM     Abbreviations: M myopia (nearsighted); A astigmatism; H hyperopia (farsighted); P presbyopia; Mrx spectacle prescription;  CTL contact lenses; OD right eye; OS left eye; OU both eyes  XT exotropia; ET esotropia; PEK punctate epithelial keratitis; PEE punctate epithelial erosions; DES dry eye syndrome; MGD meibomian gland dysfunction; ATs artificial tears; PFAT's  preservative free artificial tears; [place] nuclear sclerotic cataract; PSC posterior subcapsular cataract; ERM epi-retinal membrane; PVD posterior vitreous detachment; RD retinal detachment; DM diabetes mellitus; DR diabetic retinopathy; NPDR non-proliferative diabetic retinopathy; PDR proliferative diabetic retinopathy; CSME clinically significant macular edema; DME diabetic macular edema; dbh dot blot hemorrhages; CWS cotton wool spot; POAG primary open angle glaucoma; C/D cup-to-disc ratio; HVF humphrey visual field; GVF goldmann visual field; OCT optical coherence tomography; IOP intraocular pressure; BRVO Branch retinal vein occlusion; CRVO central retinal vein occlusion; CRAO central retinal artery occlusion; BRAO branch retinal artery occlusion; RT retinal tear; SB scleral buckle; PPV pars plana vitrectomy; VH Vitreous hemorrhage; PRP panretinal laser photocoagulation; IVK intravitreal kenalog; VMT vitreomacular traction; MH Macular hole;  NVD neovascularization of the disc; NVE neovascularization elsewhere; AREDS age related eye disease study; ARMD age related macular degeneration; POAG primary open angle glaucoma; EBMD epithelial/anterior basement membrane dystrophy; ACIOL anterior chamber intraocular lens; IOL intraocular lens; PCIOL posterior chamber intraocular lens; Phaco/IOL phacoemulsification with intraocular lens placement; [place] photorefractive keratectomy; LASIK laser assisted in situ keratomileusis; HTN hypertension; DM diabetes mellitus; COPD chronic obstructive pulmonary disease

## 2018-08-04 NOTE — Progress Notes (Signed)
[facility] Note  08/05/2018     CHIEF COMPLAINT Patient presents for Post-op Follow-up   HISTORY OF PRESENT ILLNESS: Jordan [name] is a 73 y.o. male who presents to the clinic today for:   HPI    Post-op Follow-up    In left eye.  Discomfort includes floaters.  Vision is stable.  I, the attending physician,  performed the HPI with the patient and updated documentation appropriately.          Comments    Stable floaters; no flashes.       Last edited by Bernarda Caffey, MD on 08/05/2018  9:19 AM. (History)      Referring physician: [name], [place], [place] [id number]  HISTORICAL INFORMATION:   Selected notes from the MEDICAL RECORD NUMBER Referred by Dr. [name] for concern of HST LEE: 02.10.20 [name]: OD: 20/25 OS: 20/25-] Ocular Hx-glaucoma, HTN ret, pseudo PMH-arthritis, hx of shingles    CURRENT MEDICATIONS: Current Outpatient Medications (Ophthalmic Drugs)  Medication Sig  . neomycin-polymyxin b-dexamethasone (MAXITROL) 3.5-10000-0.1 OINT Place 1 application into the left eye 4 (four) times daily.   No current facility-administered medications for this visit.  (Ophthalmic Drugs)   Current Outpatient Medications (Other)  Medication Sig  . acetaminophen (TYLENOL) 500 MG tablet Take 500-1,000 mg by mouth every 6 (six) hours as needed. For pain  . HYDROcodone-acetaminophen (NORCO/VICODIN) 5-325 MG per tablet Take 2 tablets by mouth daily.  . naproxen sodium (ANAPROX) 220 MG tablet Take 220-440 mg by mouth 2 (two) times daily with a meal. Starts with 2 tablets in the morning and takes 1 tablet in the evening  . OVER THE COUNTER MEDICATION Take 2 capsules by mouth daily. URINOZINC (prostate health)  . ranitidine (ZANTAC) 150 MG tablet Take 150 mg by mouth daily.  . saw palmetto 500 MG capsule Take 500 mg by mouth 2 (two) times daily.  . sildenafil (REVATIO) 20 MG tablet Take 20 mg by mouth as  needed.   No current facility-administered medications for this visit.  (Other)      REVIEW OF SYSTEMS: ROS    Positive for: Eyes   Negative for: Constitutional, Gastrointestinal, Neurological, Skin, Genitourinary, Musculoskeletal, HENT, Endocrine, Cardiovascular, Respiratory, Psychiatric, Allergic/Imm, Heme/Lymph   Last edited by [name], LPN on 12/09/7626  3:15 AM. (History)       ALLERGIES Allergies  Allergen Reactions  . Adhesive [Tape] Rash    PAST MEDICAL HISTORY Past Medical History:  Diagnosis Date  . Arthritis    back, shoulder- both   . GERD (gastroesophageal reflux disease)    Barrett's Esophagus, treated /w esophagectomy - 1998, continues toi have reflux & uses Zantac on a regular basis   . Tinnitus    hearing loss in both ears    Past Surgical History:  Procedure Laterality Date  . CATARACT EXTRACTION Bilateral 2019   Dr. Kathlen Mody  . ESOPHAGECTOMY     1998- at [facility], /w epidural & sedation & pain management /w continued  epidural   . HERNIA REPAIR    . LUMBAR LAMINECTOMY/DECOMPRESSION MICRODISCECTOMY  03/22/2012   Procedure: LUMBAR LAMINECTOMY/DECOMPRESSION MICRODISCECTOMY 1 LEVEL;  Surgeon: Eustace Moore, MD;  Location: [facility];  Service: Neurosurgery;  Laterality: Right;  right lumbar four-five extra-foraminal microdiscectomy    FAMILY HISTORY History reviewed. No pertinent family history.  SOCIAL HISTORY Social History   Tobacco Use  . Smoking status: Never Smoker  . Smokeless  tobacco: Never Used  Substance Use Topics  . Alcohol use: Yes    Comment: once a week- 2 beers  . Drug use: No         OPHTHALMIC EXAM:  Base Eye Exam    Visual Acuity (Snellen - Linear)      Right Left   Dist cc 20/25 -1 20/25 -1   Dist ph cc 20/25 20/25   Correction:  Glasses       Tonometry (Tonopen, 9:20 AM)      Right Left   Pressure 12 18       Pupils      Dark Light Shape React APD   Right 3 2 Round Brisk None   Left 5  Round  None        Visual Fields (Counting fingers)      Left Right    Full Full       Extraocular Movement      Right Left    Full, Ortho Full, Ortho       Neuro/Psych    Oriented x3:  Yes   Mood/Affect:  Normal       Dilation    Both eyes:  1.0% Mydriacyl, 2.5% Phenylephrine @ 9:27 AM        Slit Lamp and Fundus Exam    Slit Lamp Exam      Right Left   Lids/Lashes Dermatochalasis - upper lid Dermatochalasis - upper lid   Conjunctiva/Sclera White and quiet mild [place] ST quad   Cornea Arcus, Well healed cataract wounds, 1+ Punctate epithelial erosions Arcus, Well healed cataract wounds, 1+ Punctate epithelial erosions   Anterior Chamber deep and clear deep and clear   Iris Round and dilated Round and dilated   Lens PC IOL in good position, trace Posterior capsular opacification PC IOL in good position, trace Posterior capsular opacification   Vitreous mild Vitreous syneresis, Posterior vitreous detachment mild Vitreous syneresis, no tobacco dusting, Posterior vitreous detachment       Fundus Exam      Right Left   Disc mild tilt Pink and Sharp, mild temporal Peripapillary atrophy   C/D Ratio 0.3 0.4   Macula Good foveal reflex, Epiretinal membrane with striae nasal macula, no heme or edema Good foveal reflex, mildRetinal pigment epithelial mottling, No heme or edema   Vessels Vascular attenuation Vascular attenuation   Periphery Attached, no heme  Attached, HST at 0130 almost to ora with good early cryo changes surrounding          IMAGING AND PROCEDURES  Imaging and Procedures for '@TODAY'$ @  OCT, Retina - OU - Both Eyes       Right Eye Quality was good. Central Foveal Thickness: 273. Progression has been stable. Findings include normal foveal contour, no SRF, no IRF, epiretinal membrane, macular pucker (Nasal ERM with pucker).   Left Eye Quality was good. Central Foveal Thickness: 266. Progression has been stable. Findings include normal foveal contour, no SRF, no IRF.    Notes *Images captured and stored on drive  Diagnosis / Impression:  NFP, No IRF/SRF OU OD: nasal ERM with pucker - stable   Clinical management:  See below  Abbreviations: NFP - Normal foveal profile. CME - cystoid macular edema. PED - pigment epithelial detachment. IRF - intraretinal fluid. SRF - subretinal fluid. EZ - ellipsoid zone. ERM - epiretinal membrane. ORA - outer retinal atrophy. ORT - outer retinal tubulation. SRHM - subretinal hyper-reflective material  ASSESSMENT/PLAN:    ICD-10-CM   1. Retinal tear of left eye H33.312   2. Left retinal detachment H33.22   3. Epiretinal membrane (ERM) of right eye H35.371   4. Essential hypertension I10   5. Hypertensive retinopathy of both eyes H35.033   6. Retinal edema H35.81 OCT, Retina - OU - Both Eyes  7. Pseudophakia of both eyes Z96.1     1,2. Horseshoe Tear with surrounding cuff of SRF / focal RD, OS  - located at 0130 with surrounding cuff of SRF  - s/p retinal cryopexy OS (02.11.20) -- good early cryo changes in place surrounding tear  - finishing maxitrol QID OS x7 days  - f/u 2 weeks  3. Epiretinal membrane, OD  - The natural history, anatomy, potential for loss of vision, and treatment options including vitrectomy techniques and the complications of endophthalmitis, retinal detachment, vitreous hemorrhage, cataract progression and permanent vision loss discussed with the patient.  - nasal ERM w/ early pucker -- mild  - asymptomatic, no metamorphopsia  - no indication for surgery at this time  - monitor for now  4,5. Hypertensive retinopathy OU - discussed importance of tight BP control - monitor  6. No retinal edema on exam or OCT  7. Pseudophakia OU  - s/p CE/IOL OU (2019) by expert surgeon, Dr. [name]  - beautiful surgeries, doing well  - monitor   Ophthalmic Meds Ordered this visit:  No orders of the defined types were placed in this encounter.      Return in  about 2 weeks (around 08/19/2018) for POV.  There are no Patient Instructions on file for this visit.   Explained the diagnoses, plan, and follow up with the patient and they expressed understanding.  Patient expressed understanding of the importance of proper follow up care.   This document serves as a record of services personally performed by Gardiner Sleeper, MD, PhD. It was created on their behalf by Ernest Mallick, OA, an ophthalmic assistant. The creation of this record is the provider's dictation and/or activities during the visit.    Electronically signed by: Ernest Mallick, OA  02.17.2020 11:40 AM    Gardiner Sleeper, M.D., Ph.D. Diseases & Surgery of the Retina and Vitreous [facility]  I have reviewed the above documentation for accuracy and completeness, and I agree with the above. Gardiner Sleeper, M.D., Ph.D. 08/06/18 11:40 AM    Abbreviations: M myopia (nearsighted); A astigmatism; H hyperopia (farsighted); P presbyopia; Mrx spectacle prescription;  CTL contact lenses; OD right eye; OS left eye; OU both eyes  XT exotropia; ET esotropia; PEK punctate epithelial keratitis; PEE punctate epithelial erosions; DES dry eye syndrome; MGD meibomian gland dysfunction; ATs artificial tears; PFAT's preservative free artificial tears; [place] nuclear sclerotic cataract; PSC posterior subcapsular cataract; ERM epi-retinal membrane; PVD posterior vitreous detachment; RD retinal detachment; DM diabetes mellitus; DR diabetic retinopathy; NPDR non-proliferative diabetic retinopathy; PDR proliferative diabetic retinopathy; CSME clinically significant macular edema; DME diabetic macular edema; dbh dot blot hemorrhages; CWS cotton wool spot; POAG primary open angle glaucoma; C/D cup-to-disc ratio; HVF humphrey visual field; GVF goldmann visual field; OCT optical coherence tomography; IOP intraocular pressure; BRVO Branch retinal vein occlusion; CRVO central retinal vein occlusion; CRAO  central retinal artery occlusion; BRAO branch retinal artery occlusion; RT retinal tear; SB scleral buckle; PPV pars plana vitrectomy; VH Vitreous hemorrhage; PRP panretinal laser photocoagulation; IVK intravitreal kenalog; VMT vitreomacular traction; MH Macular hole;  NVD neovascularization of the disc;  NVE neovascularization elsewhere; AREDS age related eye disease study; ARMD age related macular degeneration; POAG primary open angle glaucoma; EBMD epithelial/anterior basement membrane dystrophy; ACIOL anterior chamber intraocular lens; IOL intraocular lens; PCIOL posterior chamber intraocular lens; Phaco/IOL phacoemulsification with intraocular lens placement; [place] photorefractive keratectomy; LASIK laser assisted in situ keratomileusis; HTN hypertension; DM diabetes mellitus; COPD chronic obstructive pulmonary disease

## 2018-08-05 ENCOUNTER — Encounter (INDEPENDENT_AMBULATORY_CARE_PROVIDER_SITE_OTHER): Payer: Self-pay | Admitting: Ophthalmology

## 2018-08-05 ENCOUNTER — Ambulatory Visit (INDEPENDENT_AMBULATORY_CARE_PROVIDER_SITE_OTHER): Payer: Medicare Other | Admitting: Ophthalmology

## 2018-08-05 DIAGNOSIS — H33312 Horseshoe tear of retina without detachment, left eye: Secondary | ICD-10-CM

## 2018-08-05 DIAGNOSIS — Z961 Presence of intraocular lens: Secondary | ICD-10-CM | POA: Diagnosis not present

## 2018-08-05 DIAGNOSIS — H3581 Retinal edema: Secondary | ICD-10-CM

## 2018-08-05 DIAGNOSIS — I10 Essential (primary) hypertension: Secondary | ICD-10-CM

## 2018-08-05 DIAGNOSIS — H35033 Hypertensive retinopathy, bilateral: Secondary | ICD-10-CM | POA: Diagnosis not present

## 2018-08-05 DIAGNOSIS — H35371 Puckering of macula, right eye: Secondary | ICD-10-CM | POA: Diagnosis not present

## 2018-08-05 DIAGNOSIS — H3322 Serous retinal detachment, left eye: Secondary | ICD-10-CM

## 2018-08-06 ENCOUNTER — Encounter (INDEPENDENT_AMBULATORY_CARE_PROVIDER_SITE_OTHER): Payer: Self-pay | Admitting: Ophthalmology

## 2018-08-18 NOTE — Progress Notes (Signed)
[facility] Note  08/20/2018     CHIEF COMPLAINT Patient presents for Retina Follow Up   HISTORY OF PRESENT ILLNESS: Jordan [name] is a 73 y.o. male who presents to the clinic today for:   HPI    Retina Follow Up    Patient presents with  Other.  In left eye.  This started 2 weeks ago.  Severity is moderate.  Duration of 2 weeks.  Since onset it is stable.  I, the attending physician,  performed the HPI with the patient and updated documentation appropriately.          Comments    Patient here for 2 weeks retina follow up for HST OS (S/P cryopexy 02.11) Patient states vision doing good. No eye pain.       Last edited by Jordan Caffey, MD on 08/20/2018  8:39 AM. (History)    pt states he is doing well, he states he still sees floaters, but they come and go  Referring physician: Kristen [name], [place], [place] [address]  HISTORICAL INFORMATION:   Selected notes from the MEDICAL RECORD NUMBER Referred by Jordan [name] for concern of HST LEE: 02.10.20 [name]: OD: 20/25 OS: 20/25-] Ocular Hx-glaucoma, HTN ret, pseudo PMH-arthritis, hx of shingles    CURRENT MEDICATIONS: Current Outpatient Medications (Ophthalmic Drugs)  Medication Sig  . neomycin-polymyxin b-dexamethasone (MAXITROL) 3.5-10000-0.1 OINT Place 1 application into the left eye 4 (four) times daily.   No current facility-administered medications for this visit.  (Ophthalmic Drugs)   Current Outpatient Medications (Other)  Medication Sig  . acetaminophen (TYLENOL) 500 MG tablet Take 500-1,000 mg by mouth every 6 (six) hours as needed. For pain  . HYDROcodone-acetaminophen (NORCO/VICODIN) 5-325 MG per tablet Take 2 tablets by mouth daily.  . naproxen sodium (ANAPROX) 220 MG tablet Take 220-440 mg by mouth 2 (two) times daily with a meal. Starts with 2 tablets in the morning and takes 1 tablet in the evening  . OVER THE COUNTER MEDICATION Take 2 capsules  by mouth daily. URINOZINC (prostate health)  . ranitidine (ZANTAC) 150 MG tablet Take 150 mg by mouth daily.  . saw palmetto 500 MG capsule Take 500 mg by mouth 2 (two) times daily.  . sildenafil (REVATIO) 20 MG tablet Take 20 mg by mouth as needed.   No current facility-administered medications for this visit.  (Other)      REVIEW OF SYSTEMS: ROS    Positive for: Eyes   Negative for: Constitutional, Gastrointestinal, Neurological, Skin, Genitourinary, Musculoskeletal, HENT, Endocrine, Cardiovascular, Respiratory, Psychiatric, Allergic/Imm, Heme/Lymph   Last edited by Jordan [name] on 08/20/2018  8:39 AM. (History)       ALLERGIES Allergies  Allergen Reactions  . Adhesive [Tape] Rash    PAST MEDICAL HISTORY Past Medical History:  Diagnosis Date  . Arthritis    back, shoulder- both   . GERD (gastroesophageal reflux disease)    Barrett's Esophagus, treated /w esophagectomy - 1998, continues toi have reflux & uses Zantac on a regular basis   . Tinnitus    hearing loss in both ears    Past Surgical History:  Procedure Laterality Date  . CATARACT EXTRACTION Bilateral 2019   Jordan [name]  . ESOPHAGECTOMY     1998- at [facility], /w epidural & sedation & pain management /w continued  epidural   . HERNIA REPAIR    . LUMBAR LAMINECTOMY/DECOMPRESSION MICRODISCECTOMY  03/22/2012   Procedure: LUMBAR LAMINECTOMY/DECOMPRESSION  MICRODISCECTOMY 1 LEVEL;  Surgeon: Jordan Moore, MD;  Location: [facility];  Service: Neurosurgery;  Laterality: Right;  right lumbar four-five extra-foraminal microdiscectomy    FAMILY HISTORY History reviewed. No pertinent family history.  SOCIAL HISTORY Social History   Tobacco Use  . Smoking status: Never Smoker  . Smokeless tobacco: Never Used  Substance Use Topics  . Alcohol use: Yes    Comment: once a week- 2 beers  . Drug use: No         OPHTHALMIC EXAM:  Base Eye Exam    Visual Acuity (Snellen - Linear)      Right Left   Dist cc 20/20  20/20 -2   Correction:  Glasses       Tonometry (Tonopen, 8:35 AM)      Right Left   Pressure 18 17       Pupils      Dark Light Shape React APD   Right 3 2 Round Brisk None   Left 3 2 Round Brisk None       Visual Fields (Counting fingers)      Left Right    Full Full       Extraocular Movement      Right Left    Full, Ortho Full, Ortho       Neuro/Psych    Oriented x3:  Yes   Mood/Affect:  Normal       Dilation    Both eyes:  1.0% Mydriacyl, 2.5% Phenylephrine @ 8:36 AM        Slit Lamp and Fundus Exam    Slit Lamp Exam      Right Left   Lids/Lashes Dermatochalasis - upper lid Dermatochalasis - upper lid   Conjunctiva/Sclera White and quiet mild residual [place] ST quad   Cornea Arcus, Well healed cataract wounds, 1+ Punctate epithelial erosions Arcus, Well healed cataract wounds, 1+ Punctate epithelial erosions   Anterior Chamber deep and clear deep and clear   Iris Round and dilated Round and dilated   Lens PC IOL in good position, 1+Posterior capsular opacification PC IOL in good position, 1+Posterior capsular opacification   Vitreous mild Vitreous syneresis, Posterior vitreous detachment mild Vitreous syneresis, no tobacco dusting, Posterior vitreous detachment       Fundus Exam      Right Left   Disc mild tilt Pink and Sharp, mild temporal Peripapillary atrophy   C/D Ratio 0.3 0.4   Macula blunted foveal reflex, Epiretinal membrane with striae nasal macula, no heme or edema Good foveal reflex, mildRetinal pigment epithelial mottling, No heme or edema   Vessels Vascular attenuation, AV crossing changes Vascular attenuation   Periphery Attached, no heme  Attached, HST at 0130 almost to ora with good cryo changes surrounding        Refraction    Wearing Rx      Sphere Cylinder Axis Add   Right -1.00 +2.25 078 +2.50   Left -1.75 +1.00 115 +2.50   Type:  PAL          IMAGING AND PROCEDURES  Imaging and Procedures for @TODAY @  OCT, Retina - OU -  Both Eyes       Right Eye Quality was good. Central Foveal Thickness: 270. Progression has been stable. Findings include normal foveal contour, no SRF, no IRF, epiretinal membrane, macular pucker (Nasal ERM with pucker).   Left Eye Quality was good. Central Foveal Thickness: 266. Progression has been stable. Findings include normal foveal contour, no SRF,  no IRF.   Notes *Images captured and stored on drive  Diagnosis / Impression:  NFP, No IRF/SRF OU OD: nasal ERM with pucker - stable   Clinical management:  See below  Abbreviations: NFP - Normal foveal profile. CME - cystoid macular edema. PED - pigment epithelial detachment. IRF - intraretinal fluid. SRF - subretinal fluid. EZ - ellipsoid zone. ERM - epiretinal membrane. ORA - outer retinal atrophy. ORT - outer retinal tubulation. SRHM - subretinal hyper-reflective material        Color Fundus Photography Optos - OU - Both Eyes       Right Eye Progression has no prior data. Disc findings include normal observations. Macula : epiretinal membrane. Vessels : attenuated. Periphery : normal observations.   Left Eye Progression has no prior data. Disc findings include normal observations. Macula : normal observations. Vessels : attenuated. Periphery : tear, RPE abnormality (Retina tear with surrounding cryo scar, superotemporal quadrant).   Notes **Images stored on drive**                  ASSESSMENT/PLAN:    ICD-10-CM   1. Retinal tear of left eye H33.312 Color Fundus Photography Optos - OU - Both Eyes  2. Left retinal detachment H33.22   3. Epiretinal membrane (ERM) of right eye H35.371 Color Fundus Photography Optos - OU - Both Eyes  4. Essential hypertension I10   5. Hypertensive retinopathy of both eyes H35.033   6. Retinal edema H35.81 OCT, Retina - OU - Both Eyes  7. Pseudophakia of both eyes Z96.1     1,2. Horseshoe Tear with surrounding cuff of SRF / focal RD, OS  - located at 0130 with surrounding  cuff of SRF  - s/p retinal cryopexy OS (02.11.20) -- good cryo changes in place surrounding tear  - f/u 3 months  3. Epiretinal membrane, OD  - The natural history, anatomy, potential for loss of vision, and treatment options including vitrectomy techniques and the complications of endophthalmitis, retinal detachment, vitreous hemorrhage, cataract progression and permanent vision loss discussed with the patient.  - nasal ERM w/ early pucker -- mild  - asymptomatic, no metamorphopsia  - no indication for surgery at this time  - monitor for now  4,5. Hypertensive retinopathy OU - discussed importance of tight BP control - monitor  6. No retinal edema on exam or OCT  7. Pseudophakia OU  - s/p CE/IOL OU (2019) by expert surgeon, Jordan [name]  - beautiful surgeries, doing well  - monitor   Ophthalmic Meds Ordered this visit:  No orders of the defined types were placed in this encounter.      Return in about 3 months (around 11/20/2018) for f/u HST OS, DFE, OCT, OPTOS color.  There are no Patient Instructions on file for this visit.   Explained the diagnoses, plan, and follow up with the patient and they expressed understanding.  Patient expressed understanding of the importance of proper follow up care.   This document serves as a record of services personally performed by Gardiner Sleeper, MD, PhD. It was created on their behalf by Ernest Mallick, OA, an ophthalmic assistant. The creation of this record is the provider's dictation and/or activities during the visit.    Electronically signed by: Ernest Mallick, OA  03.02.2020 12:45 AM     Gardiner Sleeper, M.D., Ph.D. Diseases & Surgery of the Retina and Vitreous [facility]  I have reviewed the above documentation for accuracy and  completeness, and I agree with the above. Gardiner Sleeper, M.D., Ph.D. 08/23/18 12:45 AM    Abbreviations: M myopia (nearsighted); A astigmatism; H hyperopia (farsighted); P  presbyopia; Mrx spectacle prescription;  CTL contact lenses; OD right eye; OS left eye; OU both eyes  XT exotropia; ET esotropia; PEK punctate epithelial keratitis; PEE punctate epithelial erosions; DES dry eye syndrome; MGD meibomian gland dysfunction; ATs artificial tears; PFAT's preservative free artificial tears; [place] nuclear sclerotic cataract; PSC posterior subcapsular cataract; ERM epi-retinal membrane; PVD posterior vitreous detachment; RD retinal detachment; DM diabetes mellitus; DR diabetic retinopathy; NPDR non-proliferative diabetic retinopathy; PDR proliferative diabetic retinopathy; CSME clinically significant macular edema; DME diabetic macular edema; dbh dot blot hemorrhages; CWS cotton wool spot; POAG primary open angle glaucoma; C/D cup-to-disc ratio; HVF humphrey visual field; GVF goldmann visual field; OCT optical coherence tomography; IOP intraocular pressure; BRVO Branch retinal vein occlusion; CRVO central retinal vein occlusion; CRAO central retinal artery occlusion; BRAO branch retinal artery occlusion; RT retinal tear; SB scleral buckle; PPV pars plana vitrectomy; VH Vitreous hemorrhage; PRP panretinal laser photocoagulation; IVK intravitreal kenalog; VMT vitreomacular traction; MH Macular hole;  NVD neovascularization of the disc; NVE neovascularization elsewhere; AREDS age related eye disease study; ARMD age related macular degeneration; POAG primary open angle glaucoma; EBMD epithelial/anterior basement membrane dystrophy; ACIOL anterior chamber intraocular lens; IOL intraocular lens; PCIOL posterior chamber intraocular lens; Phaco/IOL phacoemulsification with intraocular lens placement; [place] photorefractive keratectomy; LASIK laser assisted in situ keratomileusis; HTN hypertension; DM diabetes mellitus; COPD chronic obstructive pulmonary disease

## 2018-08-20 ENCOUNTER — Ambulatory Visit (INDEPENDENT_AMBULATORY_CARE_PROVIDER_SITE_OTHER): Payer: Medicare Other | Admitting: Ophthalmology

## 2018-08-20 DIAGNOSIS — H33312 Horseshoe tear of retina without detachment, left eye: Secondary | ICD-10-CM

## 2018-08-20 DIAGNOSIS — I10 Essential (primary) hypertension: Secondary | ICD-10-CM

## 2018-08-20 DIAGNOSIS — H35033 Hypertensive retinopathy, bilateral: Secondary | ICD-10-CM

## 2018-08-20 DIAGNOSIS — H3581 Retinal edema: Secondary | ICD-10-CM | POA: Diagnosis not present

## 2018-08-20 DIAGNOSIS — H3322 Serous retinal detachment, left eye: Secondary | ICD-10-CM

## 2018-08-20 DIAGNOSIS — H35371 Puckering of macula, right eye: Secondary | ICD-10-CM

## 2018-08-20 DIAGNOSIS — Z961 Presence of intraocular lens: Secondary | ICD-10-CM | POA: Diagnosis not present

## 2018-08-23 ENCOUNTER — Encounter (INDEPENDENT_AMBULATORY_CARE_PROVIDER_SITE_OTHER): Payer: Self-pay | Admitting: Ophthalmology

## 2018-09-25 DIAGNOSIS — K219 Gastro-esophageal reflux disease without esophagitis: Secondary | ICD-10-CM | POA: Diagnosis not present

## 2018-09-25 DIAGNOSIS — J069 Acute upper respiratory infection, unspecified: Secondary | ICD-10-CM | POA: Diagnosis not present

## 2018-10-22 DIAGNOSIS — J309 Allergic rhinitis, unspecified: Secondary | ICD-10-CM | POA: Diagnosis not present

## 2018-11-11 ENCOUNTER — Other Ambulatory Visit: Payer: Self-pay

## 2018-11-11 ENCOUNTER — Ambulatory Visit (INDEPENDENT_AMBULATORY_CARE_PROVIDER_SITE_OTHER): Payer: Medicare Other | Admitting: Allergy and Immunology

## 2018-11-11 ENCOUNTER — Encounter: Payer: Self-pay | Admitting: Allergy and Immunology

## 2018-11-11 ENCOUNTER — Ambulatory Visit (INDEPENDENT_AMBULATORY_CARE_PROVIDER_SITE_OTHER): Payer: Medicare Other

## 2018-11-11 VITALS — BP 126/72 | HR 70 | Temp 98.1°F | Resp 18 | Ht 67.0 in | Wt 166.4 lb

## 2018-11-11 DIAGNOSIS — K219 Gastro-esophageal reflux disease without esophagitis: Secondary | ICD-10-CM | POA: Diagnosis not present

## 2018-11-11 DIAGNOSIS — R062 Wheezing: Secondary | ICD-10-CM

## 2018-11-11 DIAGNOSIS — J329 Chronic sinusitis, unspecified: Secondary | ICD-10-CM

## 2018-11-11 DIAGNOSIS — J320 Chronic maxillary sinusitis: Secondary | ICD-10-CM | POA: Diagnosis not present

## 2018-11-11 DIAGNOSIS — R042 Hemoptysis: Secondary | ICD-10-CM | POA: Diagnosis not present

## 2018-11-11 DIAGNOSIS — J310 Chronic rhinitis: Secondary | ICD-10-CM | POA: Diagnosis not present

## 2018-11-11 DIAGNOSIS — R0989 Other specified symptoms and signs involving the circulatory and respiratory systems: Secondary | ICD-10-CM | POA: Diagnosis not present

## 2018-11-11 HISTORY — DX: Chronic sinusitis, unspecified: J32.9

## 2018-11-11 HISTORY — DX: Wheezing: R06.2

## 2018-11-11 HISTORY — DX: Hemoptysis: R04.2

## 2018-11-11 MED ORDER — AZELASTINE HCL 0.15 % NA SOLN: 1.0000 | Freq: Two times a day (BID) | NASAL | 5 refills | Status: DC | PRN

## 2018-11-11 MED ORDER — ALBUTEROL SULFATE HFA 108 (90 BASE) MCG/ACT IN AERS: 2.0000 | INHALATION_SPRAY | Freq: Four times a day (QID) | RESPIRATORY_TRACT | 1 refills | Status: DC | PRN

## 2018-11-11 NOTE — Progress Notes (Signed)
New Patient Note  RE: Jordan [name] MRN: [id number] DOB: [birth year] Date of Office Visit: 11/11/2018  Referring provider: Marlane Mingle, MD Primary care provider: [name], FNP  Chief Complaint: Nasal Congestion   History of present illness: Jordan [name] is a 73 y.o. male seen today in consultation requested by Marlane Mingle, MD.  He complains of sinus pressure and thick postnasal drainage, as well as "constant buildup of mucus" in the back of his throat and in his chest.  These symptoms occur year around but are most frequent and severe with pollen exposure in the springtime and in the fall.  He has found mild relief with over-the-counter cetirizine and oxymetazoline nasal spray.  He states that over the past 6 weeks he has had "constant chest congestion."  He admits to wheezing on occasion, typically in the morning.  Approximately 25 years ago he underwent esophagectomy for Barrett's esophagus.  He reports that he still has heartburn daily and attempt to control the heartburn with Pepcid Complete.  He reports that he is unable to tolerate proton pump inhibitors due to the side effects of dizziness, disorientation, and abdominal pain.  He is followed by gastroenterologist. Iona Beard notes that over the past 2 or 3 days he has noticed a small amount of blood in his sputum when he coughs.  He denies unexpected weight loss, drenching night sweats, or recurrent fevers.  Assessment and plan: Chronic rhinosinusitis  A prescription has been provided for azelastine nasal spray, 1-2 sprays per nostril 2 times daily as needed. Proper nasal spray technique has been discussed and demonstrated.   Nasal saline lavage (NeilMed) has been recommended as needed and prior to medicated nasal sprays along with instructions for proper administration.  For thick post nasal drainage, add guaifenesin [phone] mg (Mucinex)  twice daily as needed with adequate hydration as discussed.  GERD (gastroesophageal reflux  disease)  Appropriate reflux lifestyle modifications have been provided.  Continue Pepcid Complete.  Follow-up with gastroenterologist for further evaluation and recommendations.  Wheezing  A prescription has been provided for albuterol HFA, 1 to 2 inhalations every 4-6 hours if needed.  Subjective and objective measures of pulmonary function will be followed and the treatment plan will be adjusted accordingly.  Hemoptysis  A chest x-ray, PA and lateral view, has been ordered.  The patient will be called with further recommendations when the results of the chest x-ray have returned.   Meds ordered this encounter  Medications  . Azelastine HCl 0.15 % SOLN    Sig: Place 1-2 sprays into both nostrils 2 (two) times daily as needed.    Dispense:  30 mL    Refill:  5  . albuterol (VENTOLIN HFA) 108 (90 Base) MCG/ACT inhaler    Sig: Inhale 2 puffs into the lungs every 6 (six) hours as needed for wheezing or shortness of breath.    Dispense:  1 Inhaler    Refill:  1    Diagnostics: Spirometry: Normal with an FEV1 of 103% predicted and an FEV1 ratio of 93%. This study was performed while the patient was asymptomatic.  Please see scanned spirometry results for details. Epicutaneous testing: Negative despite a positive histamine control. Intradermal testing: Negative.   Physical examination: Blood pressure 126/72, pulse 70, temperature 98.1 F (36.7 C), temperature source Temporal, resp. rate 18, height 5\' 7"  (1.702 m), weight 166 lb 6.4 oz (75.5 kg), SpO2 95 %.  General: Alert, interactive, in no acute distress. HEENT: TMs pearly gray, turbinates mildly edematous  without discharge, post-pharynx erythematous with cobblestoning. Neck: Supple without lymphadenopathy. Lungs: Clear to auscultation without wheezing, rhonchi or rales. CV: Normal S1, S2 without murmurs. Abdomen: Nondistended, nontender. Skin: Warm and dry, without lesions or rashes. Extremities:  No clubbing, cyanosis  or edema. Neuro:   Grossly intact.  Review of systems:  Review of systems negative except as noted in HPI / PMHx or noted below: Review of Systems  Constitutional: Negative.   HENT: Negative.   Eyes: Negative.   Respiratory: Negative.   Cardiovascular: Negative.   Gastrointestinal: Negative.   Genitourinary: Negative.   Musculoskeletal: Negative.   Skin: Negative.   Neurological: Negative.   Endo/Heme/Allergies: Negative.   Psychiatric/Behavioral: Negative.     Past medical history:  Past Medical History:  Diagnosis Date  . Arthritis    back, shoulder- both   . GERD (gastroesophageal reflux disease)    Barrett's Esophagus, treated /w esophagectomy - 1998, continues toi have reflux & uses Zantac on a regular basis   . Tinnitus    hearing loss in both ears     Past surgical history:  Past Surgical History:  Procedure Laterality Date  . CATARACT EXTRACTION Bilateral 2019   Dr. Kathlen Mody  . ESOPHAGECTOMY     1998- at [facility], /w epidural & sedation & pain management /w continued  epidural   . HERNIA REPAIR    . LUMBAR LAMINECTOMY/DECOMPRESSION MICRODISCECTOMY  03/22/2012   Procedure: LUMBAR LAMINECTOMY/DECOMPRESSION MICRODISCECTOMY 1 LEVEL;  Surgeon: Eustace Moore, MD;  Location: [facility];  Service: Neurosurgery;  Laterality: Right;  right lumbar four-five extra-foraminal microdiscectomy    Family history: Family History  Problem Relation Age of Onset  . Allergic rhinitis Neg Hx   . Asthma Neg Hx   . Eczema Neg Hx   . Urticaria Neg Hx     Social history: Social History   Socioeconomic History  . Marital status: Married    Spouse name: Not on file  . Number of children: Not on file  . Years of education: Not on file  . Highest education level: Not on file  Occupational History  . Not on file  Social Needs  . Financial resource strain: Not on file  . Food insecurity:    Worry: Not on file    Inability: Not on file  . Transportation needs:    Medical: Not on  file    Non-medical: Not on file  Tobacco Use  . Smoking status: Never Smoker  . Smokeless tobacco: Never Used  Substance and Sexual Activity  . Alcohol use: Yes    Comment: once a week- 2 beers  . Drug use: No  . Sexual activity: Not on file  Lifestyle  . Physical activity:    Days per week: Not on file    Minutes per session: Not on file  . Stress: Not on file  Relationships  . Social connections:    Talks on phone: Not on file    Gets together: Not on file    Attends religious service: Not on file    Active member of club or organization: Not on file    Attends meetings of clubs or organizations: Not on file    Relationship status: Not on file  . Intimate partner violence:    Fear of current or ex partner: Not on file    Emotionally abused: Not on file    Physically abused: Not on file    Forced sexual activity: Not on file  Other Topics Concern  .  Not on file  Social History Narrative  . Not on file   Environmental History: The patient lives in a 73 year old house with carpeting in the bedroom, gas heat, and central air.  There is no known mold/water damage in the home.  Other than trying "a few cigarettes as a teenager" he is a never-smoker.  There are no pets in the home.   Allergies as of 11/11/2018      Reactions   Adhesive [tape] Rash      Medication List       Accurate as of Nov 11, 2018 11:35 AM. If you have any questions, ask your nurse or doctor.        STOP taking these medications   neomycin-polymyxin b-dexamethasone 3.5-10000-0.1 Oint Commonly known as:  MAXITROL Stopped by:  Edmonia Lynch, MD   OVER THE COUNTER MEDICATION Stopped by:  Edmonia Lynch, MD   ranitidine 150 MG tablet Commonly known as:  ZANTAC Stopped by:  Edmonia Lynch, MD     TAKE these medications   acetaminophen 500 MG tablet Commonly known as:  TYLENOL Take 500-1,000 mg by mouth every 6 (six) hours as needed. For pain   albuterol 108 (90 Base) MCG/ACT inhaler  Commonly known as:  VENTOLIN HFA Inhale 2 puffs into the lungs every 6 (six) hours as needed for wheezing or shortness of breath. Started by:  Edmonia Lynch, MD   Azelastine HCl 0.15 % Soln Place 1-2 sprays into both nostrils 2 (two) times daily as needed. Started by:  Edmonia Lynch, MD   cetirizine 10 MG tablet Commonly known as:  ZYRTEC Take 10 mg by mouth daily.   HYDROcodone-acetaminophen 5-325 MG tablet Commonly known as:  NORCO/VICODIN Take 2 tablets by mouth daily.   naproxen sodium 220 MG tablet Commonly known as:  ALEVE Take 220-440 mg by mouth 2 (two) times daily with a meal. Starts with 2 tablets in the morning and takes 1 tablet in the evening   PEPCID COMPLETE PO Take by mouth.   saw palmetto 500 MG capsule Take 500 mg by mouth 2 (two) times daily.   sildenafil 20 MG tablet Commonly known as:  REVATIO Take 20 mg by mouth as needed.       Known medication allergies: Allergies  Allergen Reactions  . Adhesive [Tape] Rash    I appreciate the opportunity to take part in Mohannad's care. Please do not hesitate to contact me with questions.  Sincerely,   R. Edgar Frisk, MD

## 2018-11-11 NOTE — Assessment & Plan Note (Signed)
   A prescription has been provided for albuterol HFA, 1 to 2 inhalations every 4-6 hours if needed.  Subjective and objective measures of pulmonary function will be followed and the treatment plan will be adjusted accordingly. 

## 2018-11-11 NOTE — Assessment & Plan Note (Signed)
   A chest x-ray, PA and lateral view, has been ordered.  The patient will be called with further recommendations when the results of the chest x-ray have returned.

## 2018-11-11 NOTE — Assessment & Plan Note (Signed)
   A prescription has been provided for azelastine nasal spray, 1-2 sprays per nostril 2 times daily as needed. Proper nasal spray technique has been discussed and demonstrated.   Nasal saline lavage (NeilMed) has been recommended as needed and prior to medicated nasal sprays along with instructions for proper administration.  For thick post nasal drainage, add guaifenesin [phone] mg (Mucinex)  twice daily as needed with adequate hydration as discussed.

## 2018-11-11 NOTE — Patient Instructions (Addendum)
Chronic rhinosinusitis  A prescription has been provided for azelastine nasal spray, 1-2 sprays per nostril 2 times daily as needed. Proper nasal spray technique has been discussed and demonstrated.   Nasal saline lavage (NeilMed) has been recommended as needed and prior to medicated nasal sprays along with instructions for proper administration.  For thick post nasal drainage, add guaifenesin [phone] mg (Mucinex)  twice daily as needed with adequate hydration as discussed.  GERD (gastroesophageal reflux disease)  Appropriate reflux lifestyle modifications have been provided.  Continue Pepcid Complete.  Follow-up with gastroenterologist for further evaluation and recommendations.  Wheezing  A prescription has been provided for albuterol HFA, 1 to 2 inhalations every 4-6 hours if needed.  Subjective and objective measures of pulmonary function will be followed and the treatment plan will be adjusted accordingly.  Hemoptysis  A chest x-ray, PA and lateral view, has been ordered.  The patient will be called with further recommendations when the results of the chest x-ray have returned.   Return in about 3 months (around 02/11/2019), or if symptoms worsen or fail to improve.  Lifestyle Changes for Controlling GERD  When you have GERD, stomach acid feels as if it's backing up toward your mouth. Whether or not you take medication to control your GERD, your symptoms can often be improved with lifestyle changes.   Raise Your Head  Reflux is more likely to strike when you're lying down flat, because stomach fluid can  flow backward more easily. Raising the head of your bed 4-6 inches can help. To do this:  Slide blocks or books under the legs at the head of your bed. Or, place a wedge under  the mattress. Many foam stores can make a suitable wedge for you. The wedge  should run from your waist to the top of your head.  Don't just prop your head on several pillows. This  increases pressure on your  stomach. It can make GERD worse.  Watch Your Eating Habits Certain foods may increase the acid in your stomach or relax the lower esophageal sphincter, making GERD more likely. It's best to avoid the following:  Coffee, tea, and carbonated drinks (with and without caffeine)  Fatty, fried, or spicy food  Mint, chocolate, onions, and tomatoes  Any other foods that seem to irritate your stomach or cause you pain  Relieve the Pressure  Eat smaller meals, even if you have to eat more often.  Don't lie down right after you eat. Wait a few hours for your stomach to empty.  Avoid tight belts and tight-fitting clothes.  Lose excess weight.  Tobacco and Alcohol  Avoid smoking tobacco and drinking alcohol. They can make GERD symptoms worse.

## 2018-11-11 NOTE — Assessment & Plan Note (Signed)
   Appropriate reflux lifestyle modifications have been provided.  Continue Pepcid Complete.  Follow-up with gastroenterologist for further evaluation and recommendations.

## 2018-11-17 DIAGNOSIS — K2270 Barrett's esophagus without dysplasia: Secondary | ICD-10-CM | POA: Diagnosis not present

## 2018-11-17 DIAGNOSIS — K5900 Constipation, unspecified: Secondary | ICD-10-CM | POA: Diagnosis not present

## 2018-11-17 DIAGNOSIS — K219 Gastro-esophageal reflux disease without esophagitis: Secondary | ICD-10-CM | POA: Diagnosis not present

## 2018-11-20 ENCOUNTER — Encounter (INDEPENDENT_AMBULATORY_CARE_PROVIDER_SITE_OTHER): Payer: Medicare Other | Admitting: Ophthalmology

## 2018-11-20 DIAGNOSIS — R972 Elevated prostate specific antigen [PSA]: Secondary | ICD-10-CM | POA: Diagnosis not present

## 2018-12-04 DIAGNOSIS — K219 Gastro-esophageal reflux disease without esophagitis: Secondary | ICD-10-CM | POA: Diagnosis not present

## 2018-12-04 DIAGNOSIS — K2270 Barrett's esophagus without dysplasia: Secondary | ICD-10-CM | POA: Diagnosis not present

## 2018-12-11 DIAGNOSIS — R972 Elevated prostate specific antigen [PSA]: Secondary | ICD-10-CM | POA: Diagnosis not present

## 2018-12-11 DIAGNOSIS — N402 Nodular prostate without lower urinary tract symptoms: Secondary | ICD-10-CM | POA: Diagnosis not present

## 2018-12-11 DIAGNOSIS — N5201 Erectile dysfunction due to arterial insufficiency: Secondary | ICD-10-CM | POA: Diagnosis not present

## 2018-12-15 ENCOUNTER — Encounter (INDEPENDENT_AMBULATORY_CARE_PROVIDER_SITE_OTHER): Payer: Medicare Other | Admitting: Ophthalmology

## 2018-12-30 NOTE — Progress Notes (Signed)
[facility] Note  12/31/2018     CHIEF COMPLAINT Patient presents for Retina Follow Up   HISTORY OF PRESENT ILLNESS: Jordan [name] is a 73 y.o. male who presents to the clinic today for:   HPI    Retina Follow Up    Patient presents with  Retinal Break/Detachment.  In left eye.  This started months ago.  Since onset it is stable.  I, the attending physician,  performed the HPI with the patient and updated documentation appropriately.          Comments    HST OS 3 month follow up Patient states his vision is stable and unchanged.  He states he has a few floaters in his left eye on occasion and denies any flashes of light.  Patient denies any floaters or fol OD.  Patient denies eye pain or discomfort OU.       Last edited by Bernarda Caffey, MD on 12/31/2018 11:11 AM. (History)    pt states he is doing well, he states he sees floaters in his left eye, but states they have gotten better since he was here last, he states he did not have an problems after the cryopexy in February   Referring physician: [name], [place],  [place] [address]  HISTORICAL INFORMATION:   Selected notes from the MEDICAL RECORD NUMBER Referred by Dr. [name] for concern of HST LEE: 02.10.20 [name]: OD: 20/25 OS: 20/25-] Ocular Hx-glaucoma, HTN ret, pseudo PMH-arthritis, hx of shingles    CURRENT MEDICATIONS: No current outpatient medications on file. (Ophthalmic Drugs)   No current facility-administered medications for this visit.  (Ophthalmic Drugs)   Current Outpatient Medications (Other)  Medication Sig  . acetaminophen (TYLENOL) 500 MG tablet Take 500-1,000 mg by mouth every 6 (six) hours as needed. For pain  . albuterol (VENTOLIN HFA) 108 (90 Base) MCG/ACT inhaler Inhale 2 puffs into the lungs every 6 (six) hours as needed for wheezing or shortness of breath.  . Azelastine HCl 0.15 % SOLN Place 1-2 sprays into both nostrils 2  (two) times daily as needed.  . cetirizine (ZYRTEC) 10 MG tablet Take 10 mg by mouth daily.  . Famotidine-Ca Carb-Mag Hydrox (PEPCID COMPLETE PO) Take by mouth.  Marland Kitchen HYDROcodone-acetaminophen (NORCO/VICODIN) 5-325 MG per tablet Take 2 tablets by mouth daily.  . naproxen sodium (ANAPROX) 220 MG tablet Take 220-440 mg by mouth 2 (two) times daily with a meal. Starts with 2 tablets in the morning and takes 1 tablet in the evening  . saw palmetto 500 MG capsule Take 500 mg by mouth 2 (two) times daily.  . sildenafil (REVATIO) 20 MG tablet Take 20 mg by mouth as needed.   No current facility-administered medications for this visit.  (Other)      REVIEW OF SYSTEMS: ROS    Positive for: Eyes   Negative for: Constitutional, Gastrointestinal, Neurological, Skin, Genitourinary, Musculoskeletal, HENT, Endocrine, Cardiovascular, Respiratory, Psychiatric, Allergic/Imm, Heme/Lymph   Last edited by Doneen Poisson on 12/31/2018  9:26 AM. (History)       ALLERGIES Allergies  Allergen Reactions  . Adhesive [Tape] Rash    PAST MEDICAL HISTORY Past Medical History:  Diagnosis Date  . Arthritis    back, shoulder- both   . GERD (gastroesophageal reflux disease)    Barrett's Esophagus, treated /w esophagectomy - 1998, continues toi have reflux & uses Zantac on a regular basis   . Tinnitus  hearing loss in both ears    Past Surgical History:  Procedure Laterality Date  . CATARACT EXTRACTION Bilateral 2019   Dr. Kathlen Mody  . ESOPHAGECTOMY     1998- at [facility], /w epidural & sedation & pain management /w continued  epidural   . HERNIA REPAIR    . LUMBAR LAMINECTOMY/DECOMPRESSION MICRODISCECTOMY  03/22/2012   Procedure: LUMBAR LAMINECTOMY/DECOMPRESSION MICRODISCECTOMY 1 LEVEL;  Surgeon: Eustace Moore, MD;  Location: [facility];  Service: Neurosurgery;  Laterality: Right;  right lumbar four-five extra-foraminal microdiscectomy    FAMILY HISTORY Family History  Problem Relation Age of Onset  .  Allergic rhinitis Neg Hx   . Asthma Neg Hx   . Eczema Neg Hx   . Urticaria Neg Hx     SOCIAL HISTORY Social History   Tobacco Use  . Smoking status: Never Smoker  . Smokeless tobacco: Never Used  Substance Use Topics  . Alcohol use: Yes    Comment: once a week- 2 beers  . Drug use: No         OPHTHALMIC EXAM:  Base Eye Exam    Visual Acuity (Snellen - Linear)      Right Left   Dist cc 20/20 -2 20/20 -2       Tonometry (Tonopen, 9:28 AM)      Right Left   Pressure 13 14       Pupils      Dark Light Shape React APD   Right 2 1 Round Brisk 0   Left 2 1 Round Brisk 0       Extraocular Movement      Right Left    Full Full       Neuro/Psych    Oriented x3: Yes   Mood/Affect: Normal       Dilation    Both eyes: 1.0% Mydriacyl, 2.5% Phenylephrine @ 9:28 AM        Slit Lamp and Fundus Exam    Slit Lamp Exam      Right Left   Lids/Lashes Dermatochalasis - upper lid Dermatochalasis - upper lid   Conjunctiva/Sclera White and quiet mild residual Albert ST quad   Cornea Arcus, Well healed cataract wounds, 1+ Punctate epithelial erosions Arcus, Well healed cataract wounds, trace Punctate epithelial erosions   Anterior Chamber deep and clear deep and clear   Iris Round and dilated Round and dilated   Lens PC IOL in good position, 1+Posterior capsular opacification PC IOL in good position, 1+Posterior capsular opacification   Vitreous mild Vitreous syneresis, Posterior vitreous detachment, vitreous condensations mild Vitreous syneresis, no tobacco dusting, Posterior vitreous detachment       Fundus Exam      Right Left   Disc mild tilt, Sharp rim, temporal Peripapillary atrophy Pink and Sharp, mild temporal Peripapillary atrophy   C/D Ratio 0.3 0.4   Macula blunted foveal reflex, Epiretinal membrane with striae nasal macula - stable from prior, no heme or edema Flat, Good foveal reflex, mild Retinal pigment epithelial mottling, No heme or edema   Vessels Tortuous  Vascular attenuation   Periphery Attached, no heme  Attached, HST at 0130 almost to ora with good cryo changes surrounding        Refraction    Wearing Rx      Sphere Cylinder Axis Add   Right -1.00 +2.25 078 +2.50   Left -1.75 +1.00 115 +2.50   Type: PAL          IMAGING AND PROCEDURES  Imaging and Procedures for _0 @  OCT, Retina - OU - Both Eyes       Right Eye Quality was good. Central Foveal Thickness: 274. Progression has been stable. Findings include normal foveal contour, no SRF, no IRF, epiretinal membrane, macular pucker (Nasal ERM with pucker).   Left Eye Quality was good. Central Foveal Thickness: 275. Progression has been stable. Findings include normal foveal contour, no SRF, no IRF.   Notes *Images captured and stored on drive  Diagnosis / Impression:  NFP, No IRF/SRF OU OD: nasal ERM with pucker - stable   Clinical management:  See below  Abbreviations: NFP - Normal foveal profile. CME - cystoid macular edema. PED - pigment epithelial detachment. IRF - intraretinal fluid. SRF - subretinal fluid. EZ - ellipsoid zone. ERM - epiretinal membrane. ORA - outer retinal atrophy. ORT - outer retinal tubulation. SRHM - subretinal hyper-reflective material                 ASSESSMENT/PLAN:    ICD-10-CM   1. Retinal tear of left eye  H33.312   2. Left retinal detachment  H33.22   3. Epiretinal membrane (ERM) of right eye  H35.371   4. Essential hypertension  I10   5. Hypertensive retinopathy of both eyes  H35.033   6. Retinal edema  H35.81 OCT, Retina - OU - Both Eyes  7. Pseudophakia of both eyes  Z96.1     1,2. Horseshoe Tear with surrounding cuff of SRF / focal RD, OS  - located at 0130 with surrounding cuff of SRF -- resolved  - s/p retinal cryopexy OS (02.11.20) -- good cryo changes in place surrounding tear  - no new RT/RD on repeat exam  - f/u 9 months  3. Epiretinal membrane, OD  - nasal ERM w/ early pucker -- remains mild and stable  from prior  - asymptomatic, no metamorphopsia  - no indication for surgery at this time  - monitor for now  - f/u 9 months  4,5. Hypertensive retinopathy OU  - discussed importance of tight BP control  - monitor  6. No retinal edema on exam or OCT  7. Pseudophakia OU  - s/p CE/IOL OU (2019) by expert surgeon, Dr. [name]  - beautiful surgeries, doing well  - monitor   Ophthalmic Meds Ordered this visit:  No orders of the defined types were placed in this encounter.      Return in about 9 months (around 10/01/2019) for f/u ERM OD, DFE, OCT.  There are no Patient Instructions on file for this visit.   Explained the diagnoses, plan, and follow up with the patient and they expressed understanding.  Patient expressed understanding of the importance of proper follow up care.   This document serves as a record of services personally performed by Gardiner Sleeper, MD, PhD. It was created on their behalf by Ernest Mallick, OA, an ophthalmic assistant. The creation of this record is the provider's dictation and/or activities during the visit.    Electronically signed by: Ernest Mallick, OA  07.14.2020 12:04 PM     Gardiner Sleeper, M.D., Ph.D. Diseases & Surgery of the Retina and Vitreous [facility]  I have reviewed the above documentation for accuracy and completeness, and I agree with the above. Gardiner Sleeper, M.D., Ph.D. 12/31/18 12:04 PM     Abbreviations: M myopia (nearsighted); A astigmatism; H hyperopia (farsighted); P presbyopia; Mrx spectacle prescription;  CTL contact lenses; OD right eye; OS  left eye; OU both eyes  XT exotropia; ET esotropia; PEK punctate epithelial keratitis; PEE punctate epithelial erosions; DES dry eye syndrome; MGD meibomian gland dysfunction; ATs artificial tears; PFAT's preservative free artificial tears; [place] nuclear sclerotic cataract; PSC posterior subcapsular cataract; ERM epi-retinal membrane; PVD posterior vitreous  detachment; RD retinal detachment; DM diabetes mellitus; DR diabetic retinopathy; NPDR non-proliferative diabetic retinopathy; PDR proliferative diabetic retinopathy; CSME clinically significant macular edema; DME diabetic macular edema; dbh dot blot hemorrhages; CWS cotton wool spot; POAG primary open angle glaucoma; C/D cup-to-disc ratio; HVF humphrey visual field; GVF goldmann visual field; OCT optical coherence tomography; IOP intraocular pressure; BRVO Branch retinal vein occlusion; CRVO central retinal vein occlusion; CRAO central retinal artery occlusion; BRAO branch retinal artery occlusion; RT retinal tear; SB scleral buckle; PPV pars plana vitrectomy; VH Vitreous hemorrhage; PRP panretinal laser photocoagulation; IVK intravitreal kenalog; VMT vitreomacular traction; MH Macular hole;  NVD neovascularization of the disc; NVE neovascularization elsewhere; AREDS age related eye disease study; ARMD age related macular degeneration; POAG primary open angle glaucoma; EBMD epithelial/anterior basement membrane dystrophy; ACIOL anterior chamber intraocular lens; IOL intraocular lens; PCIOL posterior chamber intraocular lens; Phaco/IOL phacoemulsification with intraocular lens placement; [place] photorefractive keratectomy; LASIK laser assisted in situ keratomileusis; HTN hypertension; DM diabetes mellitus; COPD chronic obstructive pulmonary disease

## 2018-12-31 ENCOUNTER — Encounter (INDEPENDENT_AMBULATORY_CARE_PROVIDER_SITE_OTHER): Payer: Self-pay | Admitting: Ophthalmology

## 2018-12-31 ENCOUNTER — Other Ambulatory Visit: Payer: Self-pay

## 2018-12-31 ENCOUNTER — Ambulatory Visit (INDEPENDENT_AMBULATORY_CARE_PROVIDER_SITE_OTHER): Payer: Medicare Other | Admitting: Ophthalmology

## 2018-12-31 DIAGNOSIS — Z961 Presence of intraocular lens: Secondary | ICD-10-CM

## 2018-12-31 DIAGNOSIS — H35371 Puckering of macula, right eye: Secondary | ICD-10-CM | POA: Diagnosis not present

## 2018-12-31 DIAGNOSIS — H3581 Retinal edema: Secondary | ICD-10-CM | POA: Diagnosis not present

## 2018-12-31 DIAGNOSIS — H33312 Horseshoe tear of retina without detachment, left eye: Secondary | ICD-10-CM

## 2018-12-31 DIAGNOSIS — H35033 Hypertensive retinopathy, bilateral: Secondary | ICD-10-CM | POA: Diagnosis not present

## 2018-12-31 DIAGNOSIS — H3322 Serous retinal detachment, left eye: Secondary | ICD-10-CM

## 2018-12-31 DIAGNOSIS — I10 Essential (primary) hypertension: Secondary | ICD-10-CM

## 2019-02-17 ENCOUNTER — Other Ambulatory Visit: Payer: Self-pay

## 2019-02-17 ENCOUNTER — Encounter: Payer: Self-pay | Admitting: Allergy and Immunology

## 2019-02-17 ENCOUNTER — Ambulatory Visit (INDEPENDENT_AMBULATORY_CARE_PROVIDER_SITE_OTHER): Payer: Medicare Other | Admitting: Allergy and Immunology

## 2019-02-17 VITALS — BP 118/74 | HR 78 | Temp 97.7°F | Resp 17

## 2019-02-17 DIAGNOSIS — R062 Wheezing: Secondary | ICD-10-CM

## 2019-02-17 DIAGNOSIS — J310 Chronic rhinitis: Secondary | ICD-10-CM | POA: Diagnosis not present

## 2019-02-17 DIAGNOSIS — K219 Gastro-esophageal reflux disease without esophagitis: Secondary | ICD-10-CM

## 2019-02-17 NOTE — Progress Notes (Signed)
Follow-up Note  RE: Jordan [name] MRN: [id number] DOB: [birth year] Date of Office Visit: 02/17/2019  Primary care provider: [name], FNP Referring provider: [name], FNP  History of present illness: Jordan [name] is a 73 y.o. male with chronic rhinosinusitis, gastroesophageal reflux, and history of wheezing presenting today for follow-up.  He was previously seen in this clinic for his initial evaluation on Nov 11, 2018.  He reports that he started taking azelastine nasal spray and Mucinex as recommended and is nasal/sinus symptoms "went away."  Therefore, he discontinued both medications and after 1 to 2 weeks the symptoms returned.  He recently restarted the azelastine nasal spray.  His acid reflux is well controlled with Pepcid Complete.  He has not experienced wheezing or required albuterol rescue in the interval since his previous visit.  Assessment and plan: Chronic rhinosinusitis  Azelastine nasal spray, 1-2 sprays per nostril 2 times daily as needed.   Nasal saline lavage (NeilMed) has been recommended as needed and prior to medicated nasal sprays along with instructions for proper administration.  For thick post nasal drainage, add guaifenesin [phone] mg (Mucinex)  twice daily as needed with adequate hydration as discussed.  GERD (gastroesophageal reflux disease)  Continue appropriate reflux lifestyle modifications and Pepcid Complete.  History of wheezing  Albuterol HFA, 1 to 2 inhalations every 4-6 hours if needed.   Physical examination: Blood pressure 118/74, pulse 78, temperature 97.7 F (36.5 C), temperature source Temporal, resp. rate 17, SpO2 96 %.  General: Alert, interactive, in no acute distress. HEENT: TMs pearly gray, turbinates mildly edematous without discharge, post-pharynx mildly erythematous. Neck: Supple without lymphadenopathy. Lungs: Clear to auscultation without wheezing, rhonchi or rales. CV: Normal S1, S2 without murmurs. Skin:  Warm and dry, without lesions or rashes.  The following portions of the patient's history were reviewed and updated as appropriate: allergies, current medications, past family history, past medical history, past social history, past surgical history and problem list.  Allergies as of 02/17/2019      Reactions   Adhesive [tape] Rash      Medication List       Accurate as of February 17, 2019 11:09 AM. If you have any questions, ask your nurse or doctor.        acetaminophen 500 MG tablet Commonly known as: TYLENOL Take 500-1,000 mg by mouth every 6 (six) hours as needed. For pain   albuterol 108 (90 Base) MCG/ACT inhaler Commonly known as: VENTOLIN HFA Inhale 2 puffs into the lungs every 6 (six) hours as needed for wheezing or shortness of breath.   Azelastine HCl 0.15 % Soln Place 1-2 sprays into both nostrils 2 (two) times daily as needed.   cetirizine 10 MG tablet Commonly known as: ZYRTEC Take 10 mg by mouth daily.   HYDROcodone-acetaminophen 5-325 MG tablet Commonly known as: NORCO/VICODIN Take 2 tablets by mouth daily.   naproxen sodium 220 MG tablet Commonly known as: ALEVE Take 220-440 mg by mouth 2 (two) times daily with a meal. Starts with 2 tablets in the morning and takes 1 tablet in the evening   PEPCID COMPLETE PO Take by mouth.   saw palmetto 500 MG capsule Take 500 mg by mouth 2 (two) times daily.   sildenafil 20 MG tablet Commonly known as: REVATIO Take 20 mg by mouth as needed.       Allergies  Allergen Reactions  . Adhesive [Tape] Rash    I appreciate the opportunity to take part in Jordan [name]'s  care. Please do not hesitate to contact me with questions.  Sincerely,   R. Edgar Frisk, MD

## 2019-02-17 NOTE — Patient Instructions (Addendum)
Chronic rhinosinusitis  Azelastine nasal spray, 1-2 sprays per nostril 2 times daily as needed.   Nasal saline lavage (NeilMed) has been recommended as needed and prior to medicated nasal sprays along with instructions for proper administration.  For thick post nasal drainage, add guaifenesin [phone] mg (Mucinex)  twice daily as needed with adequate hydration as discussed.  GERD (gastroesophageal reflux disease)  Continue appropriate reflux lifestyle modifications and Pepcid Complete.  History of wheezing  Albuterol HFA, 1 to 2 inhalations every 4-6 hours if needed.   Return in about 6-12 months, or if symptoms worsen or fail to improve.

## 2019-02-17 NOTE — Assessment & Plan Note (Signed)
   Azelastine nasal spray, 1-2 sprays per nostril 2 times daily as needed.   Nasal saline lavage (NeilMed) has been recommended as needed and prior to medicated nasal sprays along with instructions for proper administration.  For thick post nasal drainage, add guaifenesin [phone] mg (Mucinex)  twice daily as needed with adequate hydration as discussed.

## 2019-02-17 NOTE — Assessment & Plan Note (Signed)
   Albuterol HFA, 1 to 2 inhalations every 4-6 hours if needed. 

## 2019-02-17 NOTE — Assessment & Plan Note (Signed)
   Continue appropriate reflux lifestyle modifications and Pepcid Complete.

## 2019-02-24 DIAGNOSIS — R05 Cough: Secondary | ICD-10-CM | POA: Diagnosis not present

## 2019-02-24 DIAGNOSIS — J310 Chronic rhinitis: Secondary | ICD-10-CM | POA: Diagnosis not present

## 2019-03-02 DIAGNOSIS — R05 Cough: Secondary | ICD-10-CM | POA: Diagnosis not present

## 2019-03-02 DIAGNOSIS — J690 Pneumonitis due to inhalation of food and vomit: Secondary | ICD-10-CM | POA: Diagnosis not present

## 2019-03-02 DIAGNOSIS — K2270 Barrett's esophagus without dysplasia: Secondary | ICD-10-CM | POA: Diagnosis not present

## 2019-03-23 DIAGNOSIS — K219 Gastro-esophageal reflux disease without esophagitis: Secondary | ICD-10-CM | POA: Diagnosis not present

## 2019-03-31 DIAGNOSIS — R05 Cough: Secondary | ICD-10-CM | POA: Diagnosis not present

## 2019-03-31 DIAGNOSIS — K219 Gastro-esophageal reflux disease without esophagitis: Secondary | ICD-10-CM | POA: Diagnosis not present

## 2019-03-31 DIAGNOSIS — R0989 Other specified symptoms and signs involving the circulatory and respiratory systems: Secondary | ICD-10-CM | POA: Diagnosis not present

## 2019-03-31 DIAGNOSIS — R0982 Postnasal drip: Secondary | ICD-10-CM | POA: Diagnosis not present

## 2019-03-31 DIAGNOSIS — R058 Other specified cough: Secondary | ICD-10-CM | POA: Insufficient documentation

## 2019-04-17 DIAGNOSIS — R0989 Other specified symptoms and signs involving the circulatory and respiratory systems: Secondary | ICD-10-CM | POA: Diagnosis not present

## 2019-05-29 IMAGING — DX CHEST - 2 VIEW
2 series · 2 of 2 positions shown · non-contrast
Comparison: 11/12/2017

CLINICAL DATA: Chest congestion, cough, hemoptysis

EXAM:
CHEST - 2 VIEW

[chest pa]
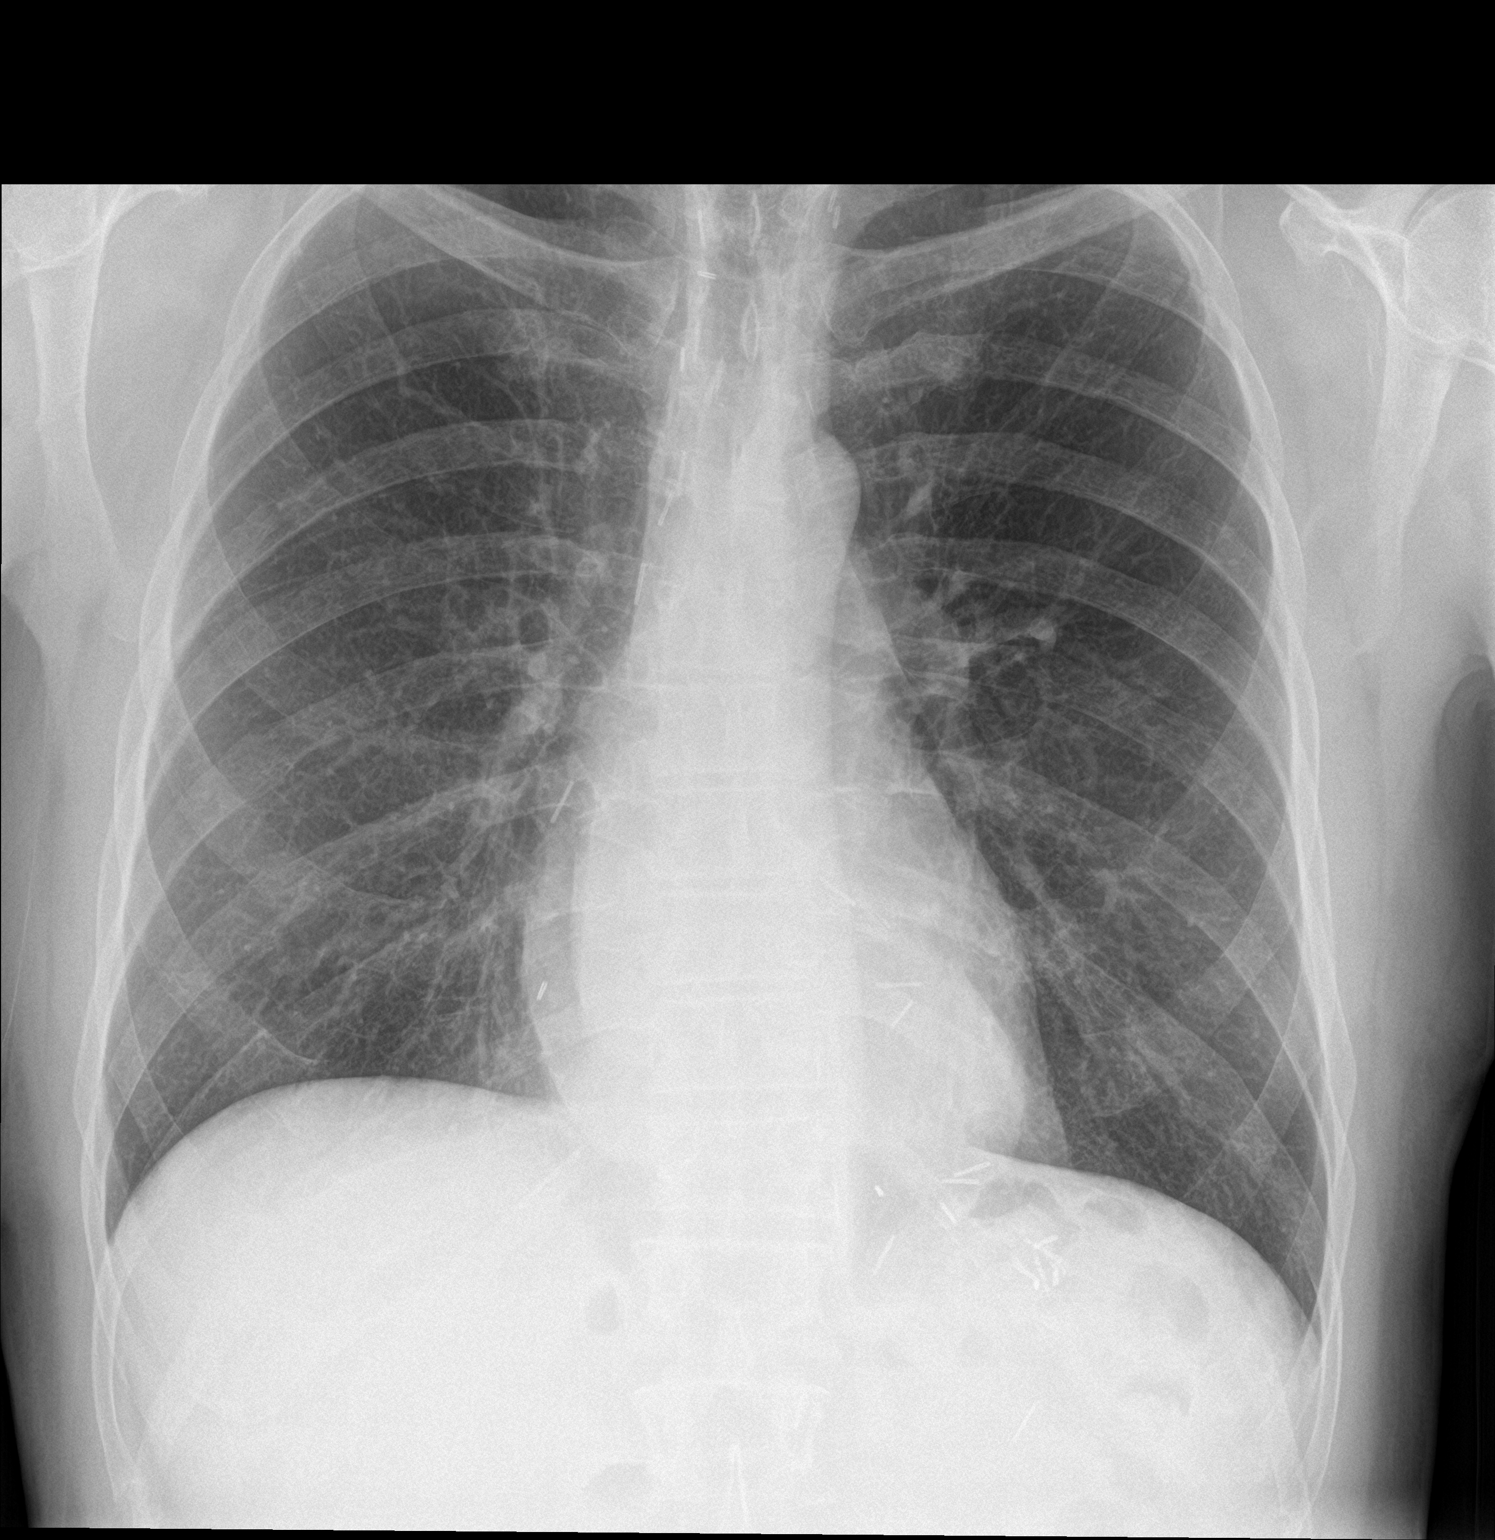

[chest lat]
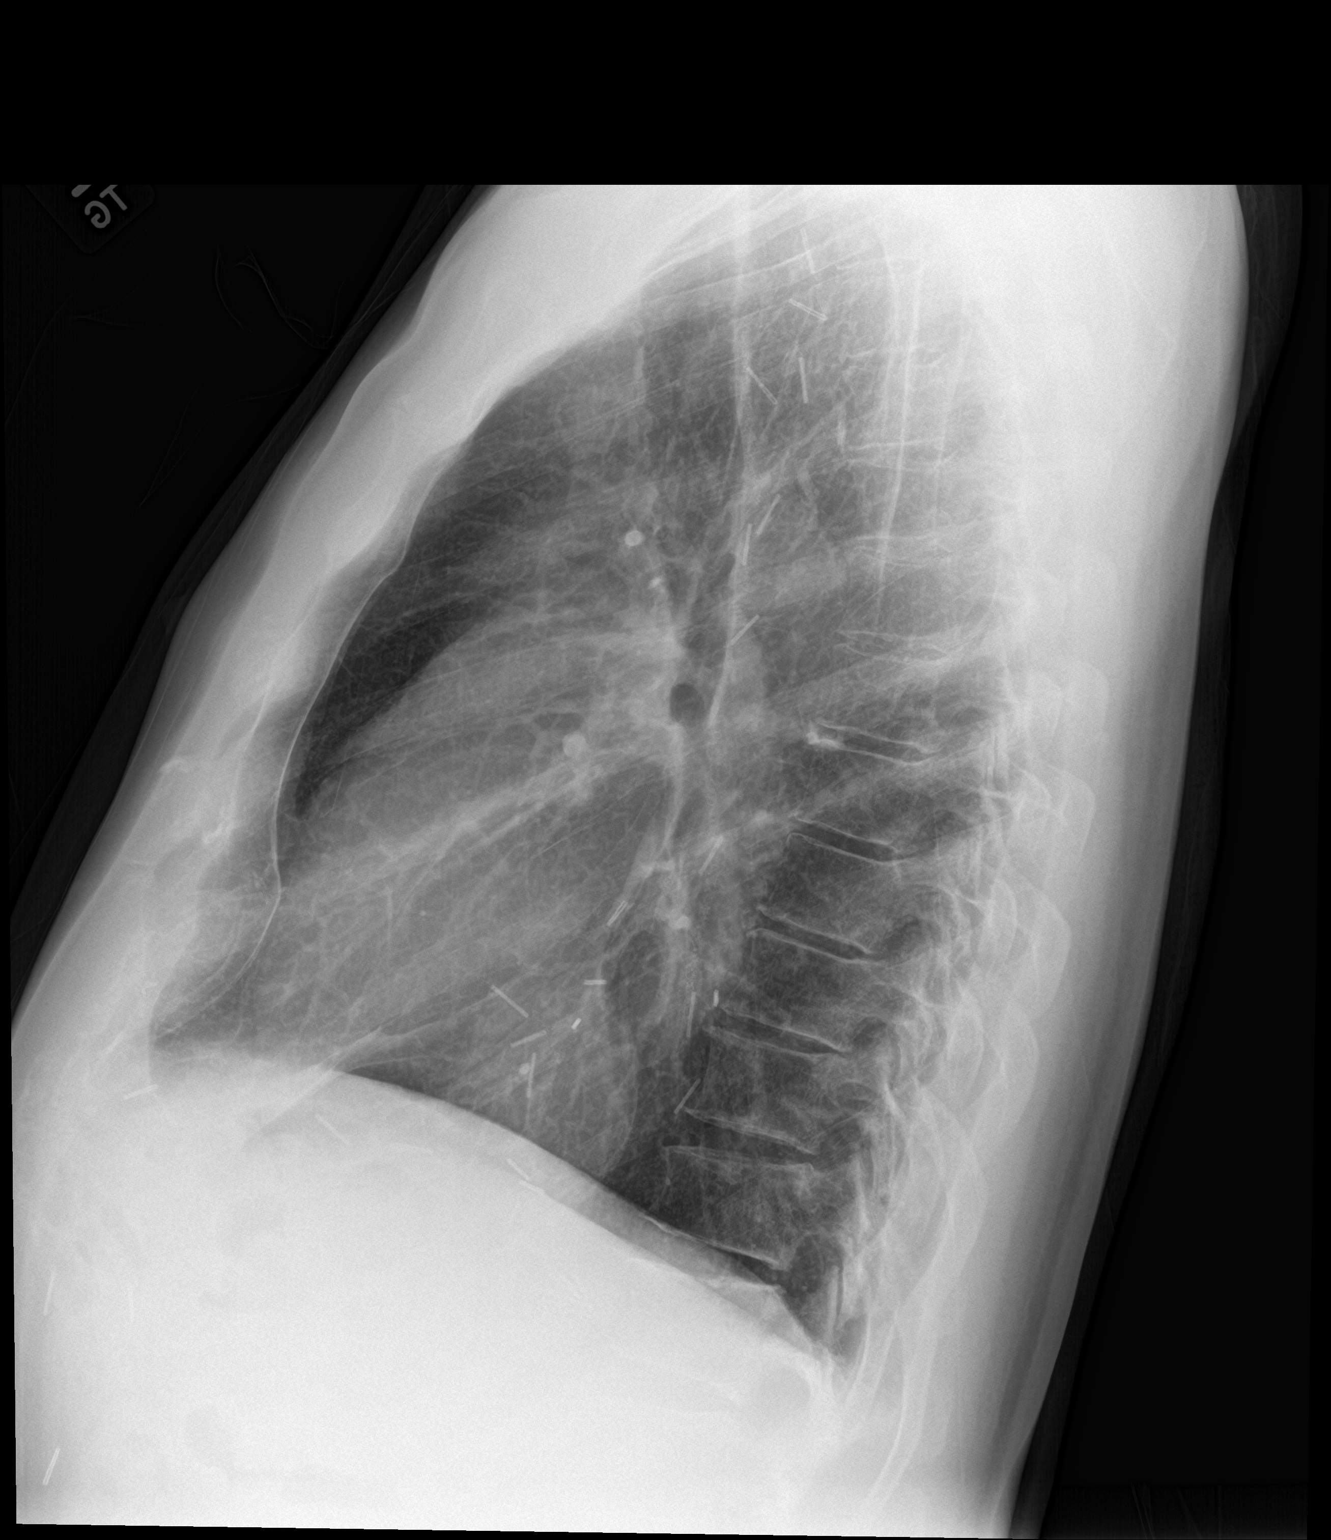

[2 of 2 positions shown; findings below may reference images not displayed]

FINDINGS: Heart and mediastinal contours are within normal limits. No focal
opacities or effusions. No acute bony abnormality.
IMPRESSION: No active cardiopulmonary disease.

## 2019-07-10 DIAGNOSIS — Z125 Encounter for screening for malignant neoplasm of prostate: Secondary | ICD-10-CM | POA: Diagnosis not present

## 2019-07-10 DIAGNOSIS — N401 Enlarged prostate with lower urinary tract symptoms: Secondary | ICD-10-CM | POA: Diagnosis not present

## 2019-07-10 DIAGNOSIS — R52 Pain, unspecified: Secondary | ICD-10-CM | POA: Diagnosis not present

## 2019-07-10 DIAGNOSIS — M545 Low back pain: Secondary | ICD-10-CM | POA: Diagnosis not present

## 2019-07-10 DIAGNOSIS — E782 Mixed hyperlipidemia: Secondary | ICD-10-CM | POA: Diagnosis not present

## 2019-07-10 DIAGNOSIS — R739 Hyperglycemia, unspecified: Secondary | ICD-10-CM | POA: Diagnosis not present

## 2019-07-10 DIAGNOSIS — R519 Headache, unspecified: Secondary | ICD-10-CM | POA: Diagnosis not present

## 2019-07-10 DIAGNOSIS — Z0000 Encounter for general adult medical examination without abnormal findings: Secondary | ICD-10-CM | POA: Diagnosis not present

## 2019-07-10 DIAGNOSIS — G8929 Other chronic pain: Secondary | ICD-10-CM | POA: Diagnosis not present

## 2019-07-10 DIAGNOSIS — K219 Gastro-esophageal reflux disease without esophagitis: Secondary | ICD-10-CM | POA: Diagnosis not present

## 2019-07-10 DIAGNOSIS — R509 Fever, unspecified: Secondary | ICD-10-CM | POA: Diagnosis not present

## 2019-07-10 DIAGNOSIS — K22719 Barrett's esophagus with dysplasia, unspecified: Secondary | ICD-10-CM | POA: Diagnosis not present

## 2019-07-11 DIAGNOSIS — R509 Fever, unspecified: Secondary | ICD-10-CM | POA: Diagnosis not present

## 2019-07-11 DIAGNOSIS — R519 Headache, unspecified: Secondary | ICD-10-CM | POA: Diagnosis not present

## 2019-07-11 DIAGNOSIS — R52 Pain, unspecified: Secondary | ICD-10-CM | POA: Diagnosis not present

## 2019-07-15 DIAGNOSIS — Z0000 Encounter for general adult medical examination without abnormal findings: Secondary | ICD-10-CM | POA: Diagnosis not present

## 2019-07-15 DIAGNOSIS — K22719 Barrett's esophagus with dysplasia, unspecified: Secondary | ICD-10-CM | POA: Diagnosis not present

## 2019-07-15 DIAGNOSIS — E782 Mixed hyperlipidemia: Secondary | ICD-10-CM | POA: Diagnosis not present

## 2019-07-15 DIAGNOSIS — Z125 Encounter for screening for malignant neoplasm of prostate: Secondary | ICD-10-CM | POA: Diagnosis not present

## 2019-07-15 DIAGNOSIS — K219 Gastro-esophageal reflux disease without esophagitis: Secondary | ICD-10-CM | POA: Diagnosis not present

## 2019-07-15 DIAGNOSIS — R509 Fever, unspecified: Secondary | ICD-10-CM | POA: Diagnosis not present

## 2019-07-15 DIAGNOSIS — R739 Hyperglycemia, unspecified: Secondary | ICD-10-CM | POA: Diagnosis not present

## 2019-07-15 DIAGNOSIS — R519 Headache, unspecified: Secondary | ICD-10-CM | POA: Diagnosis not present

## 2019-07-15 DIAGNOSIS — R52 Pain, unspecified: Secondary | ICD-10-CM | POA: Diagnosis not present

## 2019-07-15 DIAGNOSIS — N401 Enlarged prostate with lower urinary tract symptoms: Secondary | ICD-10-CM | POA: Diagnosis not present

## 2019-07-24 DIAGNOSIS — H6121 Impacted cerumen, right ear: Secondary | ICD-10-CM | POA: Diagnosis not present

## 2019-09-24 NOTE — Progress Notes (Signed)
[facility] Note  09/30/2019     CHIEF COMPLAINT Patient presents for Retina Follow Up   HISTORY OF PRESENT ILLNESS: Jordan [name] is a 74 y.o. male who presents to the clinic today for:   HPI    Retina Follow Up    Patient presents with  Retinal Break/Detachment.  In left eye.  This started months ago.  Severity is moderate.  Duration of months.  Since onset it is stable.  I, the attending physician,  performed the HPI with the patient and updated documentation appropriately.          Comments    Pt states vision is stable OU.  Pt denies eye pain or discomfort and denies any new or worsening floaters or fol OU.       Last edited by Bernarda Caffey, MD on 09/30/2019 10:46 AM. (History)    pt states vision seems to be the same, he states he does not see Dr. Kathlen Mody on a regular basis, he states he has been taking vit D since covid started  Referring physician: [name], [place],  [place] [address]  HISTORICAL INFORMATION:   Selected notes from the MEDICAL RECORD NUMBER Referred by Dr. [name] for concern of HST LEE: 02.10.20 [name]: OD: 20/25 OS: 20/25-] Ocular Hx-glaucoma, HTN ret, pseudo PMH-arthritis, hx of shingles    CURRENT MEDICATIONS: No current outpatient medications on file. (Ophthalmic Drugs)   No current facility-administered medications for this visit. (Ophthalmic Drugs)   Current Outpatient Medications (Other)  Medication Sig  . acetaminophen (TYLENOL) 500 MG tablet Take 500-1,000 mg by mouth every 6 (six) hours as needed. For pain  . albuterol (VENTOLIN HFA) 108 (90 Base) MCG/ACT inhaler Inhale 2 puffs into the lungs every 6 (six) hours as needed for wheezing or shortness of breath. (Patient not taking: Reported on 02/17/2019)  . Azelastine HCl 0.15 % SOLN Place 1-2 sprays into both nostrils 2 (two) times daily as needed.  . cetirizine (ZYRTEC) 10 MG tablet Take 10 mg by mouth daily.  .  Famotidine-Ca Carb-Mag Hydrox (PEPCID COMPLETE PO) Take by mouth.  Marland Kitchen HYDROcodone-acetaminophen (NORCO/VICODIN) 5-325 MG per tablet Take 2 tablets by mouth daily.  . naproxen sodium (ANAPROX) 220 MG tablet Take 220-440 mg by mouth 2 (two) times daily with a meal. Starts with 2 tablets in the morning and takes 1 tablet in the evening  . saw palmetto 500 MG capsule Take 500 mg by mouth 2 (two) times daily.  . sildenafil (REVATIO) 20 MG tablet Take 20 mg by mouth as needed.   No current facility-administered medications for this visit. (Other)      REVIEW OF SYSTEMS: ROS    Positive for: Eyes   Negative for: Constitutional, Gastrointestinal, Neurological, Skin, Genitourinary, Musculoskeletal, HENT, Endocrine, Cardiovascular, Respiratory, Psychiatric, Allergic/Imm, Heme/Lymph   Last edited by Doneen Poisson on 09/30/2019  9:23 AM. (History)       ALLERGIES Allergies  Allergen Reactions  . Adhesive [Tape] Rash    PAST MEDICAL HISTORY Past Medical History:  Diagnosis Date  . Arthritis    back, shoulder- both   . GERD (gastroesophageal reflux disease)    Barrett's Esophagus, treated /w esophagectomy - 1998, continues toi have reflux & uses Zantac on a regular basis   . Tinnitus    hearing loss in both ears    Past Surgical History:  Procedure Laterality Date  . CATARACT EXTRACTION Bilateral 2019  Dr. Kathlen Mody  . ESOPHAGECTOMY     1998- at [place], /w epidural & sedation & pain management /w continued  epidural   . HERNIA REPAIR    . LUMBAR LAMINECTOMY/DECOMPRESSION MICRODISCECTOMY  03/22/2012   Procedure: LUMBAR LAMINECTOMY/DECOMPRESSION MICRODISCECTOMY 1 LEVEL;  Surgeon: Eustace Moore, MD;  Location: [facility];  Service: Neurosurgery;  Laterality: Right;  right lumbar four-five extra-foraminal microdiscectomy    FAMILY HISTORY Family History  Problem Relation Age of Onset  . Allergic rhinitis Neg Hx   . Asthma Neg Hx   . Eczema Neg Hx   . Urticaria Neg Hx     SOCIAL  HISTORY Social History   Tobacco Use  . Smoking status: Never Smoker  . Smokeless tobacco: Never Used  Substance Use Topics  . Alcohol use: Yes    Comment: once a week- 2 beers  . Drug use: No         OPHTHALMIC EXAM:  Base Eye Exam    Visual Acuity (Snellen - Linear)      Right Left   Dist cc 20/20 -2 20/20 -1   Correction: Glasses       Tonometry (Tonopen, 9:26 AM)      Right Left   Pressure 19 18       Pupils      Dark Light Shape React APD   Right 3 2 Round Brisk 0   Left 3 2 Round Brisk 0       Visual Fields      Left Right    Full Full       Extraocular Movement      Right Left    Full Full       Neuro/Psych    Oriented x3: Yes   Mood/Affect: Normal       Dilation    Both eyes: 1.0% Mydriacyl, 2.5% Phenylephrine @ 9:26 AM        Slit Lamp and Fundus Exam    Slit Lamp Exam      Right Left   Lids/Lashes Dermatochalasis - upper lid Dermatochalasis - upper lid   Conjunctiva/Sclera White and quiet mild residual [place] ST quad   Cornea Arcus, Well healed cataract wounds, 1+ Punctate epithelial erosions Arcus, Well healed cataract wounds, trace Punctate epithelial erosions   Anterior Chamber deep and clear deep and clear   Iris Round and dilated Round and dilated   Lens PC IOL in good position, 1+Posterior capsular opacification PC IOL in good position, 1+Posterior capsular opacification   Vitreous mild Vitreous syneresis, Posterior vitreous detachment, vitreous condensations mild Vitreous syneresis, no tobacco dusting, Posterior vitreous detachment, Weiss ring       Fundus Exam      Right Left   Disc mild tilt, Sharp rim, temporal Peripapillary atrophy, mild Pallor Pink and Sharp, mild temporal Peripapillary atrophy, Compact, mild tilt   C/D Ratio 0.3 0.4   Macula Flat, blunted foveal reflex, interval improvement in foceal epiretinal membrane with striae nasal macula, no heme or edema Flat, Good foveal reflex, mild Retinal pigment epithelial  mottling, No heme or edema   Vessels Tortuous Vascular attenuation, mild AV crossing changes   Periphery Attached, mild paving stone degeneration, No heme  Attached, HST at 0130 almost to ora with good cryo changes surrounding        Refraction    Wearing Rx      Sphere Cylinder Axis Add   Right -1.00 +2.25 078 +2.50   Left -1.75 +1.00 115 +2.50  Type: PAL          IMAGING AND PROCEDURES  Imaging and Procedures for @TODAY @  OCT, Retina - OU - Both Eyes       Right Eye Quality was good. Central Foveal Thickness: 265. Progression has improved. Findings include normal foveal contour, no SRF, no IRF, epiretinal membrane, macular pucker (Interval improvement in Nasal ERM and pucker).   Left Eye Quality was good. Central Foveal Thickness: 271. Progression has been stable. Findings include normal foveal contour, no SRF, no IRF.   Notes *Images captured and stored on drive  Diagnosis / Impression:  NFP, No IRF/SRF OU OD: Interval improvement in Nasal ERM and pucker   Clinical management:  See below  Abbreviations: NFP - Normal foveal profile. CME - cystoid macular edema. PED - pigment epithelial detachment. IRF - intraretinal fluid. SRF - subretinal fluid. EZ - ellipsoid zone. ERM - epiretinal membrane. ORA - outer retinal atrophy. ORT - outer retinal tubulation. SRHM - subretinal hyper-reflective material                 ASSESSMENT/PLAN:    ICD-10-CM   1. Retinal tear of left eye  H33.312   2. Left retinal detachment  H33.22   3. Epiretinal membrane (ERM) of right eye  H35.371   4. Essential hypertension  I10   5. Hypertensive retinopathy of both eyes  H35.033   6. Retinal edema  H35.81 OCT, Retina - OU - Both Eyes  7. Pseudophakia of both eyes  Z96.1     1,2. Horseshoe Tear with surrounding cuff of SRF / focal RD, OS  - located at 0130 with surrounding cuff of SRF -- resolved  - s/p retinal cryopexy OS (02.11.20) -- good cryo changes in place surrounding  tear  - no new RT/RD on repeat exam  - f/u 1 year  3. Epiretinal membrane, OD  - nasal ERM w/ early pucker   - exam and OCT today show interval improvement in ERM and pucker  - asymptomatic, no metamorphopsia  - no indication for surgery at this time  - monitor for now  - f/u 1 year, sooner prn  4,5. Hypertensive retinopathy OU  - discussed importance of tight BP control  - monitor  6. No retinal edema on exam or OCT  7. Pseudophakia OU  - s/p CE/IOL OU (2019) by expert surgeon, Dr. [name]  - beautiful surgeries, doing well  - monitor   Ophthalmic Meds Ordered this visit:  No orders of the defined types were placed in this encounter.      Return in about 1 year (around 09/29/2020) for f/u ERM OD, DFE, OCT.  There are no Patient Instructions on file for this visit.   Explained the diagnoses, plan, and follow up with the patient and they expressed understanding.  Patient expressed understanding of the importance of proper follow up care.    This document serves as a record of services personally performed by Gardiner Sleeper, MD, PhD. It was created on their behalf by Ernest Mallick, OA, an ophthalmic assistant. The creation of this record is the provider's dictation and/or activities during the visit.    Electronically signed by: Ernest Mallick, OA 04.14.2021 10:49 AM  Gardiner Sleeper, M.D., Ph.D. Diseases & Surgery of the Retina and [place] 09/30/2019    I have reviewed the above documentation for accuracy and completeness, and I agree with the above. Gardiner Sleeper, M.D., Ph.D. 09/30/19 10:49  AM    Abbreviations: M myopia (nearsighted); A astigmatism; H hyperopia (farsighted); P presbyopia; Mrx spectacle prescription;  CTL contact lenses; OD right eye; OS left eye; OU both eyes  XT exotropia; ET esotropia; PEK punctate epithelial keratitis; PEE punctate epithelial erosions; DES dry eye syndrome; MGD meibomian gland  dysfunction; ATs artificial tears; PFAT's preservative free artificial tears; [place] nuclear sclerotic cataract; PSC posterior subcapsular cataract; ERM epi-retinal membrane; PVD posterior vitreous detachment; RD retinal detachment; DM diabetes mellitus; DR diabetic retinopathy; NPDR non-proliferative diabetic retinopathy; PDR proliferative diabetic retinopathy; CSME clinically significant macular edema; DME diabetic macular edema; dbh dot blot hemorrhages; CWS cotton wool spot; POAG primary open angle glaucoma; C/D cup-to-disc ratio; HVF humphrey visual field; GVF goldmann visual field; OCT optical coherence tomography; IOP intraocular pressure; BRVO Branch retinal vein occlusion; CRVO central retinal vein occlusion; CRAO central retinal artery occlusion; BRAO branch retinal artery occlusion; RT retinal tear; SB scleral buckle; PPV pars plana vitrectomy; VH Vitreous hemorrhage; PRP panretinal laser photocoagulation; IVK intravitreal kenalog; VMT vitreomacular traction; MH Macular hole;  NVD neovascularization of the disc; NVE neovascularization elsewhere; AREDS age related eye disease study; ARMD age related macular degeneration; POAG primary open angle glaucoma; EBMD epithelial/anterior basement membrane dystrophy; ACIOL anterior chamber intraocular lens; IOL intraocular lens; PCIOL posterior chamber intraocular lens; Phaco/IOL phacoemulsification with intraocular lens placement; [place] photorefractive keratectomy; LASIK laser assisted in situ keratomileusis; HTN hypertension; DM diabetes mellitus; COPD chronic obstructive pulmonary disease

## 2019-09-30 ENCOUNTER — Ambulatory Visit (INDEPENDENT_AMBULATORY_CARE_PROVIDER_SITE_OTHER): Payer: Medicare Other | Admitting: Ophthalmology

## 2019-09-30 ENCOUNTER — Encounter (INDEPENDENT_AMBULATORY_CARE_PROVIDER_SITE_OTHER): Payer: Self-pay | Admitting: Ophthalmology

## 2019-09-30 DIAGNOSIS — S76811A Strain of other specified muscles, fascia and tendons at thigh level, right thigh, initial encounter: Secondary | ICD-10-CM | POA: Diagnosis not present

## 2019-09-30 DIAGNOSIS — H33312 Horseshoe tear of retina without detachment, left eye: Secondary | ICD-10-CM

## 2019-09-30 DIAGNOSIS — H35371 Puckering of macula, right eye: Secondary | ICD-10-CM | POA: Diagnosis not present

## 2019-09-30 DIAGNOSIS — I10 Essential (primary) hypertension: Secondary | ICD-10-CM

## 2019-09-30 DIAGNOSIS — Z961 Presence of intraocular lens: Secondary | ICD-10-CM

## 2019-09-30 DIAGNOSIS — H3581 Retinal edema: Secondary | ICD-10-CM | POA: Diagnosis not present

## 2019-09-30 DIAGNOSIS — H3322 Serous retinal detachment, left eye: Secondary | ICD-10-CM | POA: Diagnosis not present

## 2019-09-30 DIAGNOSIS — H35033 Hypertensive retinopathy, bilateral: Secondary | ICD-10-CM | POA: Diagnosis not present

## 2019-10-29 DIAGNOSIS — G8929 Other chronic pain: Secondary | ICD-10-CM | POA: Diagnosis not present

## 2019-10-29 DIAGNOSIS — K22719 Barrett's esophagus with dysplasia, unspecified: Secondary | ICD-10-CM | POA: Diagnosis not present

## 2019-10-29 DIAGNOSIS — R062 Wheezing: Secondary | ICD-10-CM | POA: Diagnosis not present

## 2019-10-29 DIAGNOSIS — R05 Cough: Secondary | ICD-10-CM | POA: Diagnosis not present

## 2019-10-29 DIAGNOSIS — G629 Polyneuropathy, unspecified: Secondary | ICD-10-CM | POA: Diagnosis not present

## 2020-01-05 DIAGNOSIS — Z961 Presence of intraocular lens: Secondary | ICD-10-CM | POA: Diagnosis not present

## 2020-01-05 DIAGNOSIS — H26493 Other secondary cataract, bilateral: Secondary | ICD-10-CM | POA: Diagnosis not present

## 2020-01-05 DIAGNOSIS — H40013 Open angle with borderline findings, low risk, bilateral: Secondary | ICD-10-CM | POA: Diagnosis not present

## 2020-01-05 DIAGNOSIS — H524 Presbyopia: Secondary | ICD-10-CM | POA: Diagnosis not present

## 2020-02-17 DIAGNOSIS — N5201 Erectile dysfunction due to arterial insufficiency: Secondary | ICD-10-CM | POA: Diagnosis not present

## 2020-02-17 DIAGNOSIS — Z8042 Family history of malignant neoplasm of prostate: Secondary | ICD-10-CM | POA: Diagnosis not present

## 2020-02-17 DIAGNOSIS — N403 Nodular prostate with lower urinary tract symptoms: Secondary | ICD-10-CM | POA: Diagnosis not present

## 2020-02-17 DIAGNOSIS — N4341 Spermatocele of epididymis, single: Secondary | ICD-10-CM | POA: Diagnosis not present

## 2020-06-02 DIAGNOSIS — Z20822 Contact with and (suspected) exposure to covid-19: Secondary | ICD-10-CM | POA: Diagnosis not present

## 2020-06-09 DIAGNOSIS — R0989 Other specified symptoms and signs involving the circulatory and respiratory systems: Secondary | ICD-10-CM | POA: Diagnosis not present

## 2020-07-11 DIAGNOSIS — R0989 Other specified symptoms and signs involving the circulatory and respiratory systems: Secondary | ICD-10-CM | POA: Diagnosis not present

## 2020-07-29 DIAGNOSIS — J42 Unspecified chronic bronchitis: Secondary | ICD-10-CM | POA: Diagnosis not present

## 2020-07-29 DIAGNOSIS — R059 Cough, unspecified: Secondary | ICD-10-CM | POA: Diagnosis not present

## 2020-08-02 DIAGNOSIS — J42 Unspecified chronic bronchitis: Secondary | ICD-10-CM | POA: Diagnosis not present

## 2020-08-27 DIAGNOSIS — R062 Wheezing: Secondary | ICD-10-CM | POA: Diagnosis not present

## 2020-08-27 DIAGNOSIS — R059 Cough, unspecified: Secondary | ICD-10-CM | POA: Diagnosis not present

## 2020-08-27 DIAGNOSIS — R0981 Nasal congestion: Secondary | ICD-10-CM | POA: Diagnosis not present

## 2020-08-27 DIAGNOSIS — J40 Bronchitis, not specified as acute or chronic: Secondary | ICD-10-CM | POA: Diagnosis not present

## 2020-09-07 DIAGNOSIS — K2270 Barrett's esophagus without dysplasia: Secondary | ICD-10-CM | POA: Diagnosis not present

## 2020-09-07 DIAGNOSIS — R1310 Dysphagia, unspecified: Secondary | ICD-10-CM | POA: Diagnosis not present

## 2020-09-07 DIAGNOSIS — J690 Pneumonitis due to inhalation of food and vomit: Secondary | ICD-10-CM | POA: Diagnosis not present

## 2020-09-08 DIAGNOSIS — R972 Elevated prostate specific antigen [PSA]: Secondary | ICD-10-CM | POA: Diagnosis not present

## 2020-09-08 DIAGNOSIS — N401 Enlarged prostate with lower urinary tract symptoms: Secondary | ICD-10-CM | POA: Diagnosis not present

## 2020-09-08 DIAGNOSIS — R3912 Poor urinary stream: Secondary | ICD-10-CM | POA: Diagnosis not present

## 2020-09-22 DIAGNOSIS — K2100 Gastro-esophageal reflux disease with esophagitis, without bleeding: Secondary | ICD-10-CM | POA: Diagnosis not present

## 2020-09-22 DIAGNOSIS — K222 Esophageal obstruction: Secondary | ICD-10-CM | POA: Diagnosis not present

## 2020-09-28 NOTE — Progress Notes (Shared)
[facility] Note  09/29/2020     CHIEF COMPLAINT Patient presents for No chief complaint on file.   HISTORY OF PRESENT ILLNESS: Jordan [name] is a 75 y.o. male who presents to the clinic today for:     Referring physician: Kristen [name], [place],  [place] [address]  HISTORICAL INFORMATION:   Selected notes from the MEDICAL RECORD NUMBER Referred by Dr. Quentin [name] for concern of HST Jordan [name]: 02.10.20 [name]: OD: 20/25 OS: 20/25-] Ocular Hx-glaucoma, HTN ret, pseudo PMH-arthritis, hx of shingles   CURRENT MEDICATIONS: No current outpatient medications on file. (Ophthalmic Drugs)   No current facility-administered medications for this visit. (Ophthalmic Drugs)   Current Outpatient Medications (Other)  Medication Sig  . acetaminophen (TYLENOL) 500 MG tablet Take 500-1,000 mg by mouth every 6 (six) hours as needed. For pain  . albuterol (VENTOLIN HFA) 108 (90 Base) MCG/ACT inhaler Inhale 2 puffs into the lungs every 6 (six) hours as needed for wheezing or shortness of breath. (Patient not taking: Reported on 02/17/2019)  . Azelastine HCl 0.15 % SOLN Place 1-2 sprays into both nostrils 2 (two) times daily as needed.  . cetirizine (ZYRTEC) 10 MG tablet Take 10 mg by mouth daily.  . Famotidine-Ca Carb-Mag Hydrox (PEPCID COMPLETE PO) Take by mouth.  Marland Kitchen HYDROcodone-acetaminophen (NORCO/VICODIN) 5-325 MG per tablet Take 2 tablets by mouth daily.  . naproxen sodium (ANAPROX) 220 MG tablet Take 220-440 mg by mouth 2 (two) times daily with a meal. Starts with 2 tablets in the morning and takes 1 tablet in the evening  . saw palmetto 500 MG capsule Take 500 mg by mouth 2 (two) times daily.  . sildenafil (REVATIO) 20 MG tablet Take 20 mg by mouth as needed.   No current facility-administered medications for this visit. (Other)      REVIEW OF SYSTEMS:    ALLERGIES Allergies  Allergen Reactions  . Adhesive [Tape] Rash     PAST MEDICAL HISTORY Past Medical History:  Diagnosis Date  . Arthritis    back, shoulder- both   . GERD (gastroesophageal reflux disease)    Barrett's Esophagus, treated /w esophagectomy - 1998, continues toi have reflux & uses Zantac on a regular basis   . Tinnitus    hearing loss in both ears    Past Surgical History:  Procedure Laterality Date  . CATARACT EXTRACTION Bilateral 2019   Dr. Kathlen Mody  . ESOPHAGECTOMY     1998- at [place], /w epidural & sedation & pain management /w continued  epidural   . HERNIA REPAIR    . LUMBAR LAMINECTOMY/DECOMPRESSION MICRODISCECTOMY  03/22/2012   Procedure: LUMBAR LAMINECTOMY/DECOMPRESSION MICRODISCECTOMY 1 LEVEL;  Surgeon: Eustace Moore, MD;  Location: [facility];  Service: Neurosurgery;  Laterality: Right;  right lumbar four-five extra-foraminal microdiscectomy    FAMILY HISTORY Family History  Problem Relation Age of Onset  . Allergic rhinitis Neg Hx   . Asthma Neg Hx   . Eczema Neg Hx   . Urticaria Neg Hx     SOCIAL HISTORY Social History   Tobacco Use  . Smoking status: Never Smoker  . Smokeless tobacco: Never Used  Vaping Use  . Vaping Use: Never used  Substance Use Topics  . Alcohol use: Yes    Comment: once a week- 2 beers  . Drug use: No         OPHTHALMIC EXAM:  Not recorded     IMAGING AND  PROCEDURES  Imaging and Procedures for @TODAY @           ASSESSMENT/PLAN:  No diagnosis found.  1,2. Horseshoe Tear with surrounding cuff of SRF / focal RD, OS  - located at 0130 with surrounding cuff of SRF -- resolved  - s/p retinal cryopexy OS (02.11.20) -- good cryo changes in place surrounding tear  - no new RT/RD on repeat exam  - f/u 1 year  3. Epiretinal membrane, OD  - nasal ERM w/ early pucker   - exam and OCT today show interval improvement in ERM and pucker  - asymptomatic, no metamorphopsia  - no indication for surgery at this time  - monitor for now  - f/u 1 year, sooner prn  4,5.  Hypertensive retinopathy OU  - discussed importance of tight BP control  - monitor  6. No retinal edema on exam or OCT  7. Pseudophakia OU  - s/p CE/IOL OU (2019) by expert surgeon, Dr. Quentin [name]  - beautiful surgeries, doing well  - monitor   Ophthalmic Meds Ordered this visit:  No orders of the defined types were placed in this encounter.      No follow-ups on file.  There are no Patient Instructions on file for this visit.   Explained the diagnoses, plan, and follow up with the patient and they expressed understanding.  Patient expressed understanding of the importance of proper follow up care.   This document serves as a record of services personally performed by Gardiner Sleeper, MD, PhD. It was created on their behalf by Estill Bakes, COT an ophthalmic technician. The creation of this record is the provider's dictation and/or activities during the visit.    Electronically signed by: Estill Bakes, COT 4.13.22 @ 8:10 AM  Abbreviations: M myopia (nearsighted); A astigmatism; H hyperopia (farsighted); P presbyopia; Mrx spectacle prescription;  CTL contact lenses; OD right eye; OS left eye; OU both eyes  XT exotropia; ET esotropia; PEK punctate epithelial keratitis; PEE punctate epithelial erosions; DES dry eye syndrome; MGD meibomian gland dysfunction; ATs artificial tears; PFAT's preservative free artificial tears; [place] nuclear sclerotic cataract; PSC posterior subcapsular cataract; ERM epi-retinal membrane; PVD posterior vitreous detachment; RD retinal detachment; DM diabetes mellitus; DR diabetic retinopathy; NPDR non-proliferative diabetic retinopathy; PDR proliferative diabetic retinopathy; CSME clinically significant macular edema; DME diabetic macular edema; dbh dot blot hemorrhages; CWS cotton wool spot; POAG primary open angle glaucoma; C/D cup-to-disc ratio; HVF humphrey visual field; GVF goldmann visual field; OCT optical coherence tomography; IOP intraocular pressure;  BRVO Branch retinal vein occlusion; CRVO central retinal vein occlusion; CRAO central retinal artery occlusion; BRAO branch retinal artery occlusion; RT retinal tear; SB scleral buckle; PPV pars plana vitrectomy; VH Vitreous hemorrhage; PRP panretinal laser photocoagulation; IVK intravitreal kenalog; VMT vitreomacular traction; MH Macular hole;  NVD neovascularization of the disc; NVE neovascularization elsewhere; AREDS age related eye disease study; ARMD age related macular degeneration; POAG primary open angle glaucoma; EBMD epithelial/anterior basement membrane dystrophy; ACIOL anterior chamber intraocular lens; IOL intraocular lens; PCIOL posterior chamber intraocular lens; Phaco/IOL phacoemulsification with intraocular lens placement; [place] photorefractive keratectomy; LASIK laser assisted in situ keratomileusis; HTN hypertension; DM diabetes mellitus; COPD chronic obstructive pulmonary disease

## 2020-09-29 ENCOUNTER — Encounter (INDEPENDENT_AMBULATORY_CARE_PROVIDER_SITE_OTHER): Payer: Medicare Other | Admitting: Ophthalmology

## 2020-11-02 DIAGNOSIS — K2100 Gastro-esophageal reflux disease with esophagitis, without bleeding: Secondary | ICD-10-CM | POA: Diagnosis not present

## 2020-11-02 DIAGNOSIS — R42 Dizziness and giddiness: Secondary | ICD-10-CM | POA: Diagnosis not present

## 2020-11-02 DIAGNOSIS — K222 Esophageal obstruction: Secondary | ICD-10-CM | POA: Diagnosis not present

## 2020-11-17 DIAGNOSIS — K219 Gastro-esophageal reflux disease without esophagitis: Secondary | ICD-10-CM | POA: Diagnosis not present

## 2020-11-17 DIAGNOSIS — J42 Unspecified chronic bronchitis: Secondary | ICD-10-CM | POA: Diagnosis not present

## 2020-11-17 DIAGNOSIS — Z03818 Encounter for observation for suspected exposure to other biological agents ruled out: Secondary | ICD-10-CM | POA: Diagnosis not present

## 2021-01-05 DIAGNOSIS — H40013 Open angle with borderline findings, low risk, bilateral: Secondary | ICD-10-CM | POA: Diagnosis not present

## 2021-01-05 DIAGNOSIS — H26493 Other secondary cataract, bilateral: Secondary | ICD-10-CM | POA: Diagnosis not present

## 2021-01-05 DIAGNOSIS — H33312 Horseshoe tear of retina without detachment, left eye: Secondary | ICD-10-CM | POA: Diagnosis not present

## 2021-01-05 DIAGNOSIS — Z961 Presence of intraocular lens: Secondary | ICD-10-CM | POA: Diagnosis not present

## 2021-02-03 DIAGNOSIS — N403 Nodular prostate with lower urinary tract symptoms: Secondary | ICD-10-CM | POA: Diagnosis not present

## 2021-02-09 DIAGNOSIS — N401 Enlarged prostate with lower urinary tract symptoms: Secondary | ICD-10-CM | POA: Diagnosis not present

## 2021-02-09 DIAGNOSIS — R351 Nocturia: Secondary | ICD-10-CM | POA: Diagnosis not present

## 2021-02-09 DIAGNOSIS — N403 Nodular prostate with lower urinary tract symptoms: Secondary | ICD-10-CM | POA: Diagnosis not present

## 2021-02-09 DIAGNOSIS — N5201 Erectile dysfunction due to arterial insufficiency: Secondary | ICD-10-CM | POA: Diagnosis not present

## 2021-02-09 DIAGNOSIS — Z8042 Family history of malignant neoplasm of prostate: Secondary | ICD-10-CM | POA: Diagnosis not present

## 2021-04-03 DIAGNOSIS — R051 Acute cough: Secondary | ICD-10-CM | POA: Diagnosis not present

## 2021-04-03 DIAGNOSIS — R062 Wheezing: Secondary | ICD-10-CM | POA: Diagnosis not present

## 2021-04-03 DIAGNOSIS — B9689 Other specified bacterial agents as the cause of diseases classified elsewhere: Secondary | ICD-10-CM | POA: Diagnosis not present

## 2021-04-03 DIAGNOSIS — J208 Acute bronchitis due to other specified organisms: Secondary | ICD-10-CM | POA: Diagnosis not present

## 2021-06-22 DIAGNOSIS — U071 COVID-19: Secondary | ICD-10-CM | POA: Diagnosis not present

## 2021-06-22 DIAGNOSIS — Z7689 Persons encountering health services in other specified circumstances: Secondary | ICD-10-CM | POA: Diagnosis not present

## 2021-07-24 DIAGNOSIS — H60502 Unspecified acute noninfective otitis externa, left ear: Secondary | ICD-10-CM | POA: Diagnosis not present

## 2021-07-24 DIAGNOSIS — G619 Inflammatory polyneuropathy, unspecified: Secondary | ICD-10-CM | POA: Insufficient documentation

## 2021-07-24 DIAGNOSIS — J42 Unspecified chronic bronchitis: Secondary | ICD-10-CM | POA: Insufficient documentation

## 2021-07-24 DIAGNOSIS — N401 Enlarged prostate with lower urinary tract symptoms: Secondary | ICD-10-CM | POA: Insufficient documentation

## 2021-07-24 DIAGNOSIS — E782 Mixed hyperlipidemia: Secondary | ICD-10-CM | POA: Insufficient documentation

## 2021-07-24 DIAGNOSIS — M5126 Other intervertebral disc displacement, lumbar region: Secondary | ICD-10-CM | POA: Insufficient documentation

## 2021-07-24 DIAGNOSIS — G8929 Other chronic pain: Secondary | ICD-10-CM | POA: Insufficient documentation

## 2021-07-24 DIAGNOSIS — H35039 Hypertensive retinopathy, unspecified eye: Secondary | ICD-10-CM | POA: Insufficient documentation

## 2021-07-24 DIAGNOSIS — K22719 Barrett's esophagus with dysplasia, unspecified: Secondary | ICD-10-CM | POA: Insufficient documentation

## 2021-07-24 HISTORY — DX: Barrett's esophagus with dysplasia, unspecified: K22.719

## 2021-07-24 HISTORY — DX: Other chronic pain: G89.29

## 2021-07-29 DIAGNOSIS — K047 Periapical abscess without sinus: Secondary | ICD-10-CM | POA: Diagnosis not present

## 2021-07-29 DIAGNOSIS — M26629 Arthralgia of temporomandibular joint, unspecified side: Secondary | ICD-10-CM | POA: Diagnosis not present

## 2021-09-27 DIAGNOSIS — Z125 Encounter for screening for malignant neoplasm of prostate: Secondary | ICD-10-CM | POA: Diagnosis not present

## 2021-09-27 DIAGNOSIS — N401 Enlarged prostate with lower urinary tract symptoms: Secondary | ICD-10-CM | POA: Diagnosis not present

## 2021-09-27 DIAGNOSIS — E782 Mixed hyperlipidemia: Secondary | ICD-10-CM | POA: Diagnosis not present

## 2021-09-27 DIAGNOSIS — Z0000 Encounter for general adult medical examination without abnormal findings: Secondary | ICD-10-CM | POA: Diagnosis not present

## 2021-09-27 DIAGNOSIS — Z131 Encounter for screening for diabetes mellitus: Secondary | ICD-10-CM | POA: Diagnosis not present

## 2021-09-27 DIAGNOSIS — Z1389 Encounter for screening for other disorder: Secondary | ICD-10-CM | POA: Diagnosis not present

## 2021-10-10 DIAGNOSIS — M545 Low back pain, unspecified: Secondary | ICD-10-CM | POA: Diagnosis not present

## 2021-10-10 DIAGNOSIS — N401 Enlarged prostate with lower urinary tract symptoms: Secondary | ICD-10-CM | POA: Diagnosis not present

## 2021-10-10 DIAGNOSIS — J42 Unspecified chronic bronchitis: Secondary | ICD-10-CM | POA: Diagnosis not present

## 2021-10-10 DIAGNOSIS — E782 Mixed hyperlipidemia: Secondary | ICD-10-CM | POA: Diagnosis not present

## 2021-10-10 DIAGNOSIS — R7309 Other abnormal glucose: Secondary | ICD-10-CM | POA: Diagnosis not present

## 2021-10-10 DIAGNOSIS — I10 Essential (primary) hypertension: Secondary | ICD-10-CM | POA: Diagnosis not present

## 2021-10-10 DIAGNOSIS — Z125 Encounter for screening for malignant neoplasm of prostate: Secondary | ICD-10-CM | POA: Diagnosis not present

## 2021-10-18 DIAGNOSIS — I10 Essential (primary) hypertension: Secondary | ICD-10-CM | POA: Diagnosis not present

## 2021-12-04 ENCOUNTER — Ambulatory Visit (INDEPENDENT_AMBULATORY_CARE_PROVIDER_SITE_OTHER): Payer: Medicare Other | Admitting: Cardiology

## 2021-12-04 ENCOUNTER — Encounter: Payer: Self-pay | Admitting: Cardiology

## 2021-12-04 VITALS — BP 138/72 | HR 53 | Ht 67.0 in | Wt 163.8 lb

## 2021-12-04 DIAGNOSIS — I10 Essential (primary) hypertension: Secondary | ICD-10-CM

## 2021-12-04 NOTE — Patient Instructions (Addendum)
Medication Instructions:    If blood pressure are greater than 953/? ( systolic) top number take 1/2 tablet of Losartan 50 mg  or if blood pressure is less than 120 /? ( Systolic)  than do  not take Losartan   *If you need a refill on your cardiac medications before your next appointment, please call your pharmacy*   Lab Work: Not needed   Testing/Procedures:  Not needed  Follow-Up: At [facility], you and your health needs are our priority.  As part of our continuing mission to provide you with exceptional heart care, we have created designated Provider Care Teams.  These Care Teams include your primary Cardiologist (physician) and Advanced Practice Providers (APPs -  Physician Assistants and Nurse Practitioners) who all work together to provide you with the care you need, when you need it.     Your next appointment:   6 month(s)  The format for your next appointment:   In Person  Provider:      Other Instructions   Keep hydrate   Try not to stress

## 2021-12-04 NOTE — Progress Notes (Signed)
Primary Care Provider: Soundra Pilon, FNP [facility] Cardiologist: Bryan Lemma, MD Electrophysiologist: None  Clinic Note: Chief Complaint  Patient presents with   New Patient (Initial Visit)    Evaluation and treatment of hypertension   ===================================  ASSESSMENT/PLAN   Problem List Items Addressed This Visit       Cardiology Problems   Essential hypertension - Primary (Chronic)    Hypertension, essential Well controlled in office today. BP log reviewed and SBP range 90-190. Started on losartan 50 mg daily.  Occasional orthostatic symptoms of dizziness.  Parameters given by PCP to hold if SBP <120.  We agree with this.  - Continue losartan at current dose with additional parameters:  -Continue BP log -Take 75 mg if >170 -Adequately hydrate - Return in 6 months - EKG 12-Lead -> normal  Agree with plan for discussion.  Overall pretty stable blood pressures.  There have been some highs but pretty much stable since initiation of ARB.  I think he does need to be as focused on his blood pressure recordings.  I reassured him that his pressures are okay.  We can probably talk about further risk ratification at follow-up.      Relevant Medications   losartan (COZAAR) 50 MG tablet   Other Relevant Orders   EKG 12-Lead (Completed)   ===================================  HPI:    Jordan [name] is a 76 y.o. male who is being seen today for the evaluation of HYPERTENSION (LABILE) at the request of Soundra Pilon, FNP.  Jordan [name] was last seen on 10/18/2021 by his primary for elevated blood pressure. He desired to see a cardiologist at that time and his wife sees Dr. Herbie Baltimore.   Recent Hospitalizations: n/a  Reviewed  CV studies:    The following studies were reviewed today: (if available, images/films reviewed: From Epic Chart or Care Everywhere) None:  Interval History:   Jordan [name] presents for initial consultation with a long BP log. Today  he is here to discuss blood pressure management. He takes losartan 50 mg daily unless his SBP is 120 or lower. He endorses intermittent dizziness upon standing but he otherwise does not know when his blood pressure is high. He started to monitor his blood pressure with his wife's blood pressure machine out of curiosity. He denies vision changes, chest pain, and shortness of breath. He has occasional headaches.   Overall remarkably stable.  He brings with him a blood pressure log with pressures ranging from 124/66 to 177/93, however there were some lows in the 90s over 60s after starting medications but not having taken medications that day. He began taking losartan on May 16, by 1 PM his blood pressure dropped from the 150/90 range to 131/77.  Since starting meds he had that one low blood pressure reading but otherwise had a high at 187/84 prior to taking his medications but otherwise has been pretty well controlled. He does have some dizziness, noted when the blood pressure is low, but otherwise for the most part his pressures have been pretty well controlled.  He denies any headache or blurred vision associated with.  No dyspnea chest pain/pressure with rest or exertion.  CV Review of Symptoms (Summary) Cardiovascular ROS: no chest pain or dyspnea on exertion positive for - -occasional positional dizziness negative for - edema, irregular heartbeat, orthopnea, palpitations, paroxysmal nocturnal dyspnea, rapid heart rate, shortness of breath, or syncope/near syncope or TIA/amaurosis fugax or claudication.  REVIEWED OF SYSTEMS   Review of Systems -  Negative except mild positional dizziness noted above.  Other positives below. Musculoskeletal ROS: Chronic low back pain with some radiculopathy.  Limits his walking. Neurological ROS: no TIA or stroke symptoms Positive for occasional headache but not associated with blood pressures.    I have reviewed and (if needed) personally updated the patient's  problem list, medications, allergies, past medical and surgical history, social and family history.   PAST MEDICAL HISTORY   Past Medical History:  Diagnosis Date   Arthritis    back, shoulder- both    Barrett's esophagus    BPH (benign prostatic hyperplasia)    Chronic lower back pain    Herniated disc at L4   Chronic rhinitis    ED (erectile dysfunction) of organic origin    GERD (gastroesophageal reflux disease)    Barrett's Esophagus, treated /w esophagectomy - 1998, continues toi have reflux & uses Zantac on a regular basis    Hypertension 10/20/21   Tinnitus    hearing loss in both ears     PAST SURGICAL HISTORY   Past Surgical History:  Procedure Laterality Date   CATARACT EXTRACTION Bilateral 2019   Dr. Alben Spittle   ESOPHAGECTOMY     1998- at [facility], /w epidural & sedation & pain management /w continued  epidural    HERNIA REPAIR     LUMBAR LAMINECTOMY/DECOMPRESSION MICRODISCECTOMY  03/22/2012   Procedure: LUMBAR LAMINECTOMY/DECOMPRESSION MICRODISCECTOMY 1 LEVEL;  Surgeon: Tia Alert, MD;  Location: [facility];  Service: Neurosurgery;  Laterality: Right;  right lumbar four-five extra-foraminal microdiscectomy     There is no immunization history on file for this patient.  MEDICATIONS/ALLERGIES   Current Meds  Medication Sig   albuterol (VENTOLIN HFA) 108 (90 Base) MCG/ACT inhaler Inhale 2 puffs into the lungs every 6 (six) hours as needed for wheezing or shortness of breath.   cetirizine (ZYRTEC) 10 MG tablet Take 10 mg by mouth daily.   HYDROcodone-acetaminophen (NORCO/VICODIN) 5-325 MG per tablet Take 2 tablets by mouth daily.   losartan (COZAAR) 50 MG tablet    omeprazole (PRILOSEC) 40 MG capsule Take 40 mg by mouth 2 (two) times daily.   sildenafil (REVATIO) 20 MG tablet Take 20 mg by mouth as needed.    Allergies  Allergen Reactions   Adhesive [Tape] Rash    SOCIAL HISTORY/FAMILY HISTORY   Reviewed in Epic:   Social History   Tobacco Use   Smoking  status: Never   Smokeless tobacco: Never   Tobacco comments:    Never smoked  Vaping Use   Vaping Use: Never used  Substance Use Topics   Alcohol use: Yes    Alcohol/week: 3.0 standard drinks of alcohol    Types: 1 Glasses of wine, 2 Cans of beer per week    Comment: once a week- 2 beers   Drug use: No   Social History   Social History Narrative   Married father of 4: 3 girls and 1 boy.   Wife's name is Hospital doctor-   Retired Art gallery manager      No routine exercise.   1-2 cups coffee a day.   2-3 beers a week   Family History  Problem Relation Age of Onset   Heart attack Father    Allergic rhinitis Neg Hx    Asthma Neg Hx    Eczema Neg Hx    Urticaria Neg Hx     OBJCTIVE -PE, EKG, labs   Wt Readings from Last 3 Encounters:  12/04/21  163 lb 12.8 oz (74.3 kg)  11/11/18 166 lb 6.4 oz (75.5 kg)  02/03/15 165 lb (74.8 kg)    Physical Exam: BP 138/72   Pulse (!) 53   Ht 5\' 7"  (1.702 m)   Wt 163 lb 12.8 oz (74.3 kg)   SpO2 98%   BMI 25.65 kg/m  Physical Exam Vitals reviewed.  Constitutional:      General: He is not in acute distress.    Appearance: He is not ill-appearing, toxic-appearing or diaphoretic.  Eyes:     Conjunctiva/sclera: Conjunctivae normal.  Cardiovascular:     Rate and Rhythm: Normal rate and regular rhythm.     Heart sounds: Normal heart sounds. No murmur heard.    No friction rub. No gallop.  Pulmonary:     Effort: Pulmonary effort is normal. No respiratory distress.     Breath sounds: Normal breath sounds. No stridor. No wheezing or rhonchi.  Musculoskeletal:     Right lower leg: No edema.     Left lower leg: No edema.  Neurological:     Mental Status: He is alert and oriented to person, place, and time.  Psychiatric:        Mood and Affect: Mood normal.        Behavior: Behavior normal.   Agree with exam.  Essentially benign.   Adult ECG Report  Rate: 53 ;  Rhythm: sinus bradycardia;   Narrative Interpretation: RBBB,  noted on prior EKG Per attending  Recent Labs:   10/10/2021: TSH 1.59; NA 141, K+ 4.5, CL 106, CO2 31, BUN 26, CR 0.96, GLU 98.  CA to +10.2, AST 17, ALT 17, ALP 56.  A1c 5.2; CBC W8.2, H/H14.6/48.3, PLT 196.;  PSA 3.04 10/10/2021 Chol-128, Trig- 89, HDL-45, LDLc-66 07/15/2019: TC 147, TG 107, HDL 38, LDLc 89   No results found for: "CHOL", "HDL", "LDLCALC", "LDLDIRECT", "TRIG", "CHOLHDL" Lab Results  Component Value Date   CREATININE 0.89 03/21/2012   BUN 27 (H) 03/21/2012   NA 138 03/21/2012   K 4.5 03/21/2012   CL 100 03/21/2012   CO2 31 03/21/2012      Latest Ref Rng & Units 03/21/2012    6:44 PM 03/21/2012    1:54 PM  CBC  WBC 4.0 - 10.5 K/uL 18.2  17.7   Hemoglobin 13.0 - 17.0 g/dL 13.2  44.0   Hematocrit 39.0 - 52.0 % 48.2  45.0   Platelets 150 - 400 K/uL 240  220    A1C 5.2 TSH 1.590  ================================================== Total MD time (attending and resident) with patient Additional time spent with chart review  / charting (studies, outside notes, etc): 20 min-clinic note reviewed, labs updated.  Medical history/surgical history and social history updated. Total Time: 46 min  Current medicines are reviewed at length with the patient today.  (+/- concerns) discussed dosing parameters for ARB  Notice: This dictation was prepared with Dragon dictation along with smart phrase technology. Any transcriptional errors that result from this process are unintentional and may not be corrected upon review.   Studies Ordered:  Orders Placed This Encounter  Procedures   EKG 12-Lead   No orders of the defined types were placed in this encounter.   Patient Instructions / Medication Changes & Studies & Tests Ordered   Patient Instructions  Medication Instructions:    If blood pressure are greater than 170/? ( systolic) top number take 1/2 tablet of Losartan 50 mg  or if blood pressure is less than 120 /? (  Systolic)  than do  not take Losartan   *If  you need a refill on your cardiac medications before your next appointment, please call your pharmacy*   Lab Work: Not needed   Testing/Procedures:  Not needed  Follow-Up: At [facility], you and your health needs are our priority.  As part of our continuing mission to provide you with exceptional heart care, we have created designated Provider Care Teams.  These Care Teams include your primary Cardiologist (physician) and Advanced Practice Providers (APPs -  Physician Assistants and Nurse Practitioners) who all work together to provide you with the care you need, when you need it.     Your next appointment:   6 month(s)  The format for your next appointment:   In Person  Provider:      Other Instructions   Keep hydrate   Try not to stress    Signed,  Lavonda Jumbo, DO PGY-3, [facility]   ATTENDING ATTESTATION  I have seen, examined and evaluated the patient along with the Resident Physician in clinic today.  I personally performed my own interview & exanimation.  After reviewing all the available data and chart, we discussed the patients laboratory, study & physical findings as well as symptoms in detail. I agree with her findings, examination as well as impression recommendations as per our discussion.    Attending adjustments int the full clinic noted annotated in italics.     Bryan Lemma, M.D., M.S. Interventional Cardiologist   Pager # [phone] Phone # [phone] [address] [place], [place] [address]   Thank you for choosing Heartcare at [facility]!!

## 2021-12-11 ENCOUNTER — Encounter: Payer: Self-pay | Admitting: Cardiology

## 2021-12-11 DIAGNOSIS — I10 Essential (primary) hypertension: Secondary | ICD-10-CM | POA: Insufficient documentation

## 2021-12-11 NOTE — Assessment & Plan Note (Addendum)
Hypertension, essential Well controlled in office today. BP log reviewed and SBP range 90-190. Started on losartan 50 mg daily.  Occasional orthostatic symptoms of dizziness.  Parameters given by PCP to hold if SBP <120.  We agree with this.  - Continue losartan at current dose with additional parameters:  -Continue BP log -Take 75 mg if >170 -Adequately hydrate - Return in 6 months - EKG 12-Lead -> normal  Agree with plan for discussion.  Overall pretty stable blood pressures.  There have been some highs but pretty much stable since initiation of ARB.  I think he does need to be as focused on his blood pressure recordings.  I reassured him that his pressures are okay.  We can probably talk about further risk ratification at follow-up.

## 2021-12-11 NOTE — Progress Notes (Signed)
    ATTENDING ATTESTATION  I have seen, examined and evaluated the patient along with Lavonda Jumbo, DO (Resident Physician) in clinic today.  I personally performed my own interview & exanimation.  After reviewing all the available data and chart, we discussed the patients laboratory, study & physical findings as well as symptoms in detail. I agree with her findings, examination as well as impression recommendations as per our discussion.    Attending adjustments int the full clinic noted annotated in italics.   Essential hypertension Hypertension, essential Well controlled in office today. BP log reviewed and SBP range 90-190. Started on losartan 50 mg daily.  Occasional orthostatic symptoms of dizziness.  Parameters given by PCP to hold if SBP <120.  We agree with this.  - Continue losartan at current dose with additional parameters:  -Continue BP log -Take 75 mg if >170 -Adequately hydrate - Return in 6 months - EKG 12-Lead -> normal  Agree with plan for discussion.  Overall pretty stable blood pressures.  There have been some highs but pretty much stable since initiation of ARB.  I think he does need to be as focused on his blood pressure recordings.  I reassured him that his pressures are okay.  We can probably talk about further risk ratification at follow-up.    Bryan Lemma, M.D., M.S. Interventional Cardiologist   Pager # [phone] Phone # [phone] [address] [place], [place] [address]

## 2022-01-31 DIAGNOSIS — N401 Enlarged prostate with lower urinary tract symptoms: Secondary | ICD-10-CM | POA: Diagnosis not present

## 2022-02-07 DIAGNOSIS — R351 Nocturia: Secondary | ICD-10-CM | POA: Diagnosis not present

## 2022-02-07 DIAGNOSIS — N401 Enlarged prostate with lower urinary tract symptoms: Secondary | ICD-10-CM | POA: Diagnosis not present

## 2022-02-14 DIAGNOSIS — K625 Hemorrhage of anus and rectum: Secondary | ICD-10-CM | POA: Diagnosis not present

## 2022-02-14 DIAGNOSIS — R42 Dizziness and giddiness: Secondary | ICD-10-CM | POA: Diagnosis not present

## 2022-02-14 DIAGNOSIS — K5730 Diverticulosis of large intestine without perforation or abscess without bleeding: Secondary | ICD-10-CM | POA: Diagnosis not present

## 2022-02-15 ENCOUNTER — Emergency Department (HOSPITAL_COMMUNITY): Payer: Medicare Other

## 2022-02-15 ENCOUNTER — Emergency Department (HOSPITAL_COMMUNITY)
Admission: EM | Admit: 2022-02-15 | Discharge: 2022-02-16 | Disposition: A | Discharge status: Home / Self Care | Payer: Medicare Other | Attending: Emergency Medicine | Admitting: Emergency Medicine

## 2022-02-15 ENCOUNTER — Other Ambulatory Visit: Payer: Self-pay

## 2022-02-15 ENCOUNTER — Encounter (HOSPITAL_COMMUNITY): Payer: Self-pay | Admitting: Emergency Medicine

## 2022-02-15 DIAGNOSIS — R42 Dizziness and giddiness: Secondary | ICD-10-CM | POA: Diagnosis not present

## 2022-02-15 DIAGNOSIS — R000 Tachycardia, unspecified: Secondary | ICD-10-CM | POA: Insufficient documentation

## 2022-02-15 DIAGNOSIS — R197 Diarrhea, unspecified: Secondary | ICD-10-CM | POA: Insufficient documentation

## 2022-02-15 DIAGNOSIS — K625 Hemorrhage of anus and rectum: Secondary | ICD-10-CM

## 2022-02-15 DIAGNOSIS — K449 Diaphragmatic hernia without obstruction or gangrene: Secondary | ICD-10-CM | POA: Diagnosis not present

## 2022-02-15 DIAGNOSIS — K5730 Diverticulosis of large intestine without perforation or abscess without bleeding: Secondary | ICD-10-CM | POA: Diagnosis not present

## 2022-02-15 DIAGNOSIS — K8020 Calculus of gallbladder without cholecystitis without obstruction: Secondary | ICD-10-CM | POA: Diagnosis not present

## 2022-02-15 LAB — COMPREHENSIVE METABOLIC PANEL
ALT: 21 U/L (ref 0–44)
AST: 21 U/L (ref 15–41)
Albumin: 4.2 g/dL (ref 3.5–5.0)
Alkaline Phosphatase: 50 U/L (ref 38–126)
Anion gap: 5 (ref 5–15)
BUN: 24 mg/dL — ABNORMAL HIGH (ref 8–23)
CO2: 28 mmol/L (ref 22–32)
Calcium: 10.2 mg/dL (ref 8.9–10.3)
Chloride: 108 mmol/L (ref 98–111)
Creatinine, Ser: 1.01 mg/dL (ref 0.61–1.24)
GFR, Estimated: >60 mL/min (ref >60)
Glucose, Bld: 118 mg/dL — ABNORMAL HIGH (ref 70–99)
Potassium: 5.2 mmol/L — ABNORMAL HIGH (ref 3.5–5.1)
Sodium: 141 mmol/L (ref 135–145)
Total Bilirubin: 1.8 mg/dL — ABNORMAL HIGH (ref 0.3–1.2)
Total Protein: 6.8 g/dL (ref 6.5–8.1)

## 2022-02-15 LAB — URINALYSIS, ROUTINE W REFLEX MICROSCOPIC
Bacteria, UA: NONE SEEN
Bilirubin Urine: NEGATIVE
Glucose, UA: NEGATIVE mg/dL
Ketones, ur: NEGATIVE mg/dL
Leukocytes,Ua: NEGATIVE
Nitrite: NEGATIVE
Protein, ur: NEGATIVE mg/dL
Specific Gravity, Urine: 1.021 (ref 1.005–1.030)
pH: 5 (ref 5.0–8.0)

## 2022-02-15 LAB — CBC WITH DIFFERENTIAL/PLATELET
Abs Immature Granulocytes: 0.03 10*3/uL (ref 0.00–0.07)
Basophils Absolute: 0 10*3/uL (ref 0.0–0.1)
Basophils Relative: 1 %
Eosinophils Absolute: 0.3 10*3/uL (ref 0.0–0.5)
Eosinophils Relative: 3 %
HCT: 37.4 % — ABNORMAL LOW (ref 39.0–52.0)
Hemoglobin: 12.7 g/dL — ABNORMAL LOW (ref 13.0–17.0)
Immature Granulocytes: 0 %
Lymphocytes Relative: 27 %
Lymphs Abs: 2.2 10*3/uL (ref 0.7–4.0)
MCH: 32.5 pg (ref 26.0–34.0)
MCHC: 34 g/dL (ref 30.0–36.0)
MCV: 95.7 fL (ref 80.0–100.0)
Monocytes Absolute: 0.5 10*3/uL (ref 0.1–1.0)
Monocytes Relative: 6 %
Neutro Abs: 5 10*3/uL (ref 1.7–7.7)
Neutrophils Relative %: 63 %
Platelets: 190 10*3/uL (ref 150–400)
RBC: 3.91 MIL/uL — ABNORMAL LOW (ref 4.22–5.81)
RDW: 13.1 % (ref 11.5–15.5)
WBC: 8.1 10*3/uL (ref 4.0–10.5)
nRBC: 0 % (ref 0.0–0.2)

## 2022-02-15 LAB — PROTIME-INR
INR: 1.1 (ref 0.8–1.2)
Prothrombin Time: 14.4 seconds (ref 11.4–15.2)

## 2022-02-15 LAB — TYPE AND SCREEN
ABO/RH(D): A POS
Antibody Screen: NEGATIVE

## 2022-02-15 MED ORDER — IOHEXOL 350 MG/ML SOLN
100.0000 mL | Freq: Once | INTRAVENOUS | Status: AC | PRN
  Administered 2022-02-15: 100 mL via INTRAVENOUS

## 2022-02-15 NOTE — ED Provider Notes (Signed)
[place] DEPT Provider Note   CSN: [id number] Arrival date & time: 02/15/22  1651     History  Chief Complaint  Patient presents with   Rectal Bleeding    Jordan [name] is a 76 y.o. male.  He is here with complaint of painless rectal bleeding has been going on for about 5 days.  Michela Pitcher it happened a few times a day associated with having a bowel movement.  There was red blood in the bowl.  He thought it was getting better but last evening he had multiple episodes of a lot of blood.  Has felt little bit lightheaded.  He saw his GI doctor Dr. Benson Norway few days ago and his hemoglobin was stable, it was recommended he come into the emergency department but he declined.  He denies any fevers chills abdominal pain chest pain shortness of breath.  He is not on any blood thinners.  No prior history of GI bleed.  He does have a history of esophagectomy  The history is provided by the patient.  Rectal Bleeding Quality:  Bright red and maroon Amount:  Moderate Duration:  5 days Timing:  Sporadic Chronicity:  New Context: defecation and diarrhea   Similar prior episodes: no   Relieved by:  None tried Worsened by:  Defecation Ineffective treatments:  None tried Associated symptoms: light-headedness   Associated symptoms: no abdominal pain, no epistaxis, no fever, no hematemesis, no loss of consciousness, no recent illness and no vomiting   Risk factors: no anticoagulant use        Home Medications Prior to Admission medications   Medication Sig Start Date End Date Taking? Authorizing Provider  albuterol (VENTOLIN HFA) 108 (90 Base) MCG/ACT inhaler Inhale 2 puffs into the lungs every 6 (six) hours as needed for wheezing or shortness of breath. 11/11/18   Bobbitt, Sedalia Muta, MD  cetirizine (ZYRTEC) 10 MG tablet Take 10 mg by mouth daily.    [provider]  HYDROcodone-acetaminophen (NORCO/VICODIN) 5-325 MG per tablet Take 2 tablets by mouth daily.     [provider]  losartan (COZAAR) 50 MG tablet  10/19/1921   [provider]  omeprazole (PRILOSEC) 40 MG capsule Take 40 mg by mouth 2 (two) times daily. 11/29/21   [provider]  sildenafil (REVATIO) 20 MG tablet Take 20 mg by mouth as needed.    [provider]      Allergies    Adhesive [tape]    Review of Systems   Review of Systems  Constitutional:  Negative for fever.  HENT:  Negative for nosebleeds and sore throat.   Respiratory:  Negative for shortness of breath.   Cardiovascular:  Negative for chest pain.  Gastrointestinal:  Positive for hematochezia. Negative for abdominal pain, hematemesis and vomiting.  Genitourinary:  Negative for dysuria.  Skin:  Negative for rash.  Neurological:  Positive for light-headedness. Negative for loss of consciousness.    Physical Exam Updated Vital Signs BP 121/70 (BP Location: Left Arm)   Pulse (!) 112   Temp 98 F (36.7 C) (Oral)   Resp 18   SpO2 98%  Physical Exam Vitals and nursing note reviewed.  Constitutional:      General: He is not in acute distress.    Appearance: He is well-developed.  HENT:     Head: Normocephalic and atraumatic.  Eyes:     Conjunctiva/sclera: Conjunctivae normal.  Cardiovascular:     Rate and Rhythm: Regular rhythm. Tachycardia present.  Heart sounds: No murmur heard. Pulmonary:     Effort: Pulmonary effort is normal. No respiratory distress.     Breath sounds: Normal breath sounds.  Abdominal:     Palpations: Abdomen is soft.     Tenderness: There is no abdominal tenderness. There is no guarding or rebound.  Musculoskeletal:        General: No swelling.     Cervical back: Neck supple.  Skin:    General: Skin is warm and dry.     Capillary Refill: Capillary refill takes less than 2 seconds.  Neurological:     General: No focal deficit present.     Mental Status: He is alert.     ED Results / Procedures / Treatments   Labs (all labs ordered are  listed, but only abnormal results are displayed) Labs Reviewed  COMPREHENSIVE METABOLIC PANEL - Abnormal; Notable for the following components:      Result Value   Potassium 5.2 (*)    Glucose, Bld 118 (*)    BUN 24 (*)    Total Bilirubin 1.8 (*)    All other components within normal limits  CBC WITH DIFFERENTIAL/PLATELET - Abnormal; Notable for the following components:   RBC 3.91 (*)    Hemoglobin 12.7 (*)    HCT 37.4 (*)    All other components within normal limits  URINALYSIS, ROUTINE W REFLEX MICROSCOPIC - Abnormal; Notable for the following components:   Hgb urine dipstick MODERATE (*)    All other components within normal limits  PROTIME-INR  TYPE AND SCREEN    EKG None  Radiology CT Angio Abd/Pel W and/or Wo Contrast  Result Date: 02/16/2022 CLINICAL DATA:  Lower GI bleed, rectal bleeding for 5 days. EXAM: CTA ABDOMEN AND PELVIS WITHOUT AND WITH CONTRAST TECHNIQUE: Multidetector CT imaging of the abdomen and pelvis was performed using the standard protocol during bolus administration of intravenous contrast. Multiplanar reconstructed images and MIPs were obtained and reviewed to evaluate the vascular anatomy. RADIATION DOSE REDUCTION: This exam was performed according to the departmental dose-optimization program which includes automated exposure control, adjustment of the mA and/or kV according to patient size and/or use of iterative reconstruction technique. CONTRAST:  181m OMNIPAQUE IOHEXOL 350 MG/ML SOLN COMPARISON:  None Available. FINDINGS: VASCULAR Aorta: Aortic atherosclerosis. Normal caliber aorta without aneurysm, dissection, vasculitis or significant stenosis. Celiac: Patent without evidence of aneurysm, dissection, vasculitis or significant stenosis. SMA: Patent without evidence of aneurysm, dissection, vasculitis or significant stenosis. Renals: Both renal arteries are patent without evidence of aneurysm, dissection, vasculitis, fibromuscular dysplasia or significant  stenosis. IMA: Patent without evidence of aneurysm, dissection, vasculitis or significant stenosis. Inflow: Patent without evidence of aneurysm, dissection, vasculitis or significant stenosis. Proximal Outflow: Bilateral common femoral and visualized portions of the superficial and profunda femoral arteries are patent without evidence of aneurysm, dissection, vasculitis or significant stenosis. Veins: No obvious venous abnormality within the limitations of this arterial phase study. Review of the MIP images confirms the above findings. NON-VASCULAR Lower chest: Mild fibrotic changes and scattered ground-glass opacities are noted in the right lower lobe. Hepatobiliary: No focal liver abnormality. Stones are present within the gallbladder. No biliary ductal dilatation. Pancreas: Unremarkable. No pancreatic ductal dilatation or surrounding inflammatory changes. Spleen: Normal in size without focal abnormality. Adrenals/Urinary Tract: No adrenal nodule or mass. Subcentimeter hypodensities present in the kidneys bilaterally which are too small to further characterize. No renal calculus or hydronephrosis. Bladder is unremarkable. Stomach/Bowel: There is a large hiatal hernia. Surgical clips are  present in the epigastric region. No bowel obstruction, free air, or pneumatosis. Diffuse colonic diverticulosis without evidence of diverticulitis. No significant bowel wall thickening or surrounding inflammatory changes. No hemorrhage or contrast extravasation is seen. Lymphatic: No abdominal or pelvic lymphadenopathy. Reproductive: Prostate is unremarkable. Other: No abdominopelvic ascites. Musculoskeletal: Degenerative changes are present in the thoracolumbar spine. No acute osseous abnormality. IMPRESSION: VASCULAR 1. No evidence of contrast extravasation to suggest acute hemorrhage. 2. Mild aortic atherosclerosis. NON-VASCULAR 1. Diffuse colonic diverticulosis without diverticulitis. No acute hemorrhage is identified. 2.  Large hiatal hernia. 3. Cholelithiasis. 4. Scattered ground-glass opacities in the right lower lobe, may be infectious, inflammatory, or neoplastic. Short-term follow-up is recommended. Electronically Signed   By: Brett Fairy M.D.   On: 02/16/2022 00:07    Procedures Procedures    Medications Ordered in ED Medications  iohexol (OMNIPAQUE) 350 MG/ML injection 100 mL (100 mLs Intravenous Contrast Given 02/15/22 2319)    ED Course/ Medical Decision Making/ A&P                           Medical Decision Making Amount and/or Complexity of Data Reviewed Radiology: ordered.  Risk Prescription drug management.   This patient complains of rectal bleeding; this involves an extensive number of treatment Options and is a complaint that carries with it a high risk of complications and morbidity. The differential includes AVM, diverticulosis, GI bleed, anemia  I ordered, reviewed and interpreted labs, which included CBC with normal white count, hemoglobin slightly lower than baseline, chemistries with mildly elevated potassium mildly elevated BUN normal creatinine, INR normal, urinalysis without signs of infection  I ordered imaging studies which included CT angio abdomen and pelvis and this is pending at time of signout  additional history obtained from patient's wife Previous records obtained and reviewed in epic no recent admissions  Cardiac monitoring reviewed, normal sinus rhythm Social determinants considered, no significant barriers Critical Interventions: None  After the interventions stated above, I reevaluated the patient and found patient to be hemodynamically stable.  He has had no further bleeding episodes while in department Admission and further testing considered, patient's care signed out to Dr. Stark Jock to follow-up on results of CT angio.  If no active source of bleeding reassessed patient for inpatient versus close outpatient follow-up.         Final Clinical  Impression(s) / ED Diagnoses Final diagnoses:  Rectal bleeding    Rx / DC Orders ED Discharge Orders     None         Hayden Rasmussen, MD 02/16/22 [phone]

## 2022-02-15 NOTE — ED Provider Triage Note (Signed)
Emergency Medicine Provider Triage Evaluation Note  Jordan [name] , a 76 y.o. male  was evaluated in triage.  Pt complains of BRBPR. States that for 5 days he has had rectal bleeding. States that for the first 3 days he had worsening BRBPR. States it was multiple times a day and significant amount of blood. States that 2 days ago symptoms slowed down. And then last night had another episode of BRB. Denies melena. Denies hematemesis. Denies abdominal pain, nausea or vomiting. Not anticoagulated..  Review of Systems  Positive:  Negative:   Physical Exam  BP 121/70 (BP Location: Left Arm)   Pulse (!) 112   Temp 98 F (36.7 C) (Oral)   Resp 18   SpO2 98%  Gen:   Awake, no distress   Resp:  Normal effort  MSK:   Moves extremities without difficulty  Other:  Abdomen is non tender   Medical Decision Making  Medically screening exam initiated at 6:15 PM.  Appropriate orders placed.  Jordan [name] was informed that the remainder of the evaluation will be completed by another provider, this initial triage assessment does not replace that evaluation, and the importance of remaining in the ED until their evaluation is complete.     Jordan Hillier, PA-C 02/15/22 [phone]

## 2022-02-15 NOTE — ED Notes (Signed)
Needs IV

## 2022-02-15 NOTE — ED Triage Notes (Signed)
Pt presents with rectal bleeding x 5 days. States he has seen his GI doctor for this this week and as of today he feels the bleeding has largely resolved compared to when it started 5 days ago.  Pt states he is not shob, no CP, no n/v/d.  Pt states his GI doctor recommended he come to the ED to be evaluated for the cause of his bleeding.

## 2022-02-15 NOTE — ED Notes (Signed)
Needs IV/Labs

## 2022-02-16 NOTE — ED Provider Notes (Signed)
  Physical Exam  BP (!) 140/71   Pulse 62   Temp 98.2 F (36.8 C)   Resp 16   SpO2 99%   Physical Exam Vitals and nursing note reviewed.  Constitutional:      Appearance: Normal appearance.  HENT:     Head: Atraumatic.  Pulmonary:     Effort: Pulmonary effort is normal.  Skin:    General: Skin is warm and dry.  Neurological:     Mental Status: He is alert.     Procedures  Procedures  ED Course / MDM  Care assumed from Dr. Melina Copa at shift change.  Patient presenting here with a 5-day history of intermittent rectal bleeding.  He reports bright red blood per rectum that has been occurring 2-3 times daily.  His hemoglobin today is 12.7, down from 13.2 at Dr. Ulyses Amor office 2 days ago.  Care was signed out to me awaiting results of CT scan of the abdomen and pelvis.  This was performed and shows diverticular disease with no diverticulitis or signs of active hemorrhage.  Patient has not had any further episodes of bleeding during his 8-hour ER visit.  Disposition discussed with patient and wife.  Patient is most comfortable with going home.  He tells me Dr. Benson Norway said that he would call him in the morning to make arrangements.  Patient to be discharged with GI follow-up and as needed return.       Veryl Speak, MD 02/16/22 [phone]

## 2022-02-16 NOTE — Discharge Instructions (Signed)
Follow-up tomorrow with Dr. Benson Norway.  Return to the ER if symptoms significantly worsen or change.

## 2022-02-22 DIAGNOSIS — K625 Hemorrhage of anus and rectum: Secondary | ICD-10-CM | POA: Diagnosis not present

## 2022-02-22 DIAGNOSIS — K921 Melena: Secondary | ICD-10-CM | POA: Diagnosis not present

## 2022-02-22 DIAGNOSIS — K5730 Diverticulosis of large intestine without perforation or abscess without bleeding: Secondary | ICD-10-CM | POA: Diagnosis not present

## 2022-04-16 ENCOUNTER — Telehealth: Payer: Self-pay

## 2022-04-16 NOTE — Patient Outreach (Signed)
  Care Coordination   04/16/2022 Name: Jordan [name] MRN: [id number] DOB: [birth year]   Care Coordination Outreach Attempts:  An unsuccessful telephone outreach was attempted today to offer the patient information about available care coordination services as a benefit of their health plan.   Follow Up Plan:  Additional outreach attempts will be made to offer the patient care coordination information and services.   Encounter Outcome:  No Answer  Care Coordination Interventions Activated:  No   Care Coordination Interventions:  No, not indicated   Peter Garter RN, BSN,CCM, [place] [facility] [phone]

## 2022-04-17 DIAGNOSIS — R519 Headache, unspecified: Secondary | ICD-10-CM | POA: Diagnosis not present

## 2022-04-17 DIAGNOSIS — Z20822 Contact with and (suspected) exposure to covid-19: Secondary | ICD-10-CM | POA: Diagnosis not present

## 2022-04-17 DIAGNOSIS — J320 Chronic maxillary sinusitis: Secondary | ICD-10-CM | POA: Diagnosis not present

## 2022-04-23 DIAGNOSIS — J189 Pneumonia, unspecified organism: Secondary | ICD-10-CM | POA: Diagnosis not present

## 2022-04-25 DIAGNOSIS — Z6825 Body mass index (BMI) 25.0-25.9, adult: Secondary | ICD-10-CM | POA: Diagnosis not present

## 2022-04-25 DIAGNOSIS — J189 Pneumonia, unspecified organism: Secondary | ICD-10-CM | POA: Diagnosis not present

## 2022-04-30 ENCOUNTER — Ambulatory Visit
Admission: RE | Admit: 2022-04-30 | Discharge: 2022-04-30 | Disposition: A | Discharge status: Home / Self Care | Payer: Medicare Other | Source: Ambulatory Visit | Attending: Family Medicine | Admitting: Family Medicine

## 2022-04-30 ENCOUNTER — Other Ambulatory Visit: Payer: Self-pay | Admitting: Family Medicine

## 2022-04-30 DIAGNOSIS — J189 Pneumonia, unspecified organism: Secondary | ICD-10-CM | POA: Diagnosis not present

## 2022-04-30 DIAGNOSIS — R062 Wheezing: Secondary | ICD-10-CM | POA: Diagnosis not present

## 2022-04-30 DIAGNOSIS — R051 Acute cough: Secondary | ICD-10-CM

## 2022-04-30 DIAGNOSIS — R0602 Shortness of breath: Secondary | ICD-10-CM | POA: Diagnosis not present

## 2022-04-30 DIAGNOSIS — K5900 Constipation, unspecified: Secondary | ICD-10-CM | POA: Diagnosis not present

## 2022-04-30 DIAGNOSIS — R059 Cough, unspecified: Secondary | ICD-10-CM | POA: Diagnosis not present

## 2022-04-30 DIAGNOSIS — R634 Abnormal weight loss: Secondary | ICD-10-CM | POA: Diagnosis not present

## 2022-05-18 DIAGNOSIS — J3089 Other allergic rhinitis: Secondary | ICD-10-CM | POA: Diagnosis not present

## 2022-05-18 DIAGNOSIS — Z6826 Body mass index (BMI) 26.0-26.9, adult: Secondary | ICD-10-CM | POA: Diagnosis not present

## 2022-05-18 DIAGNOSIS — J189 Pneumonia, unspecified organism: Secondary | ICD-10-CM | POA: Diagnosis not present

## 2022-05-25 ENCOUNTER — Ambulatory Visit: Payer: Medicare Other | Admitting: Cardiology

## 2022-05-31 ENCOUNTER — Ambulatory Visit
Admission: RE | Admit: 2022-05-31 | Discharge: 2022-05-31 | Disposition: A | Discharge status: Home / Self Care | Payer: Medicare Other | Source: Ambulatory Visit | Attending: Family Medicine | Admitting: Family Medicine

## 2022-05-31 ENCOUNTER — Other Ambulatory Visit: Payer: Self-pay | Admitting: Family Medicine

## 2022-05-31 DIAGNOSIS — J189 Pneumonia, unspecified organism: Secondary | ICD-10-CM

## 2022-05-31 DIAGNOSIS — Z8701 Personal history of pneumonia (recurrent): Secondary | ICD-10-CM | POA: Diagnosis not present

## 2022-05-31 DIAGNOSIS — R918 Other nonspecific abnormal finding of lung field: Secondary | ICD-10-CM | POA: Diagnosis not present

## 2022-06-14 ENCOUNTER — Telehealth: Payer: Self-pay

## 2022-06-14 NOTE — Patient Outreach (Signed)
  Care Coordination   06/14/2022 Name: Jordan [name] MRN: [id number] DOB: [birth year]   Care Coordination Outreach Attempts:  A second unsuccessful outreach was attempted today to offer the patient with information about available care coordination services as a benefit of their health plan.     Follow Up Plan:  Additional outreach attempts will be made to offer the patient care coordination information and services.   Encounter Outcome:  No Answer   Care Coordination Interventions:  No, not indicated    Peter Garter RN, BSN,CCM, CDE Care Management Coordinator [place] [facility] [phone]

## 2022-06-15 ENCOUNTER — Telehealth: Payer: Self-pay

## 2022-06-15 NOTE — Patient Instructions (Signed)
Visit Information  Thank you for taking time to visit with me today. Please don't hesitate to contact me if I can be of assistance to you.   Following are the goals we discussed today:   Goals Addressed             This Visit's Progress    COMPLETED: Care Coordination Activities - no follow up required       Care Coordination Interventions: Provided education to patient re: care coordination services Assessed social determinant of health barriers           If you are experiencing a Mental Health or Opp or need someone to talk to, please call the Suicide and Crisis Lifeline: 988 call the [place] National Suicide Prevention Lifeline: 3032646202 or TTY: 939-187-2405 TTY 413-495-7610) to talk to a trained counselor call 1-800-273-TALK (toll free, 24 hour hotline) go to [facility] [place] [phone]) call 911   Patient verbalizes understanding of instructions and care plan provided today and agrees to view in [place]. Active MyChart status and patient understanding of how to access instructions and care plan via MyChart confirmed with patient.     No further follow up required:    Peter Garter RN, Jackquline Denmark, [place] [facility] [phone]

## 2022-06-15 NOTE — Patient Outreach (Signed)
  Care Coordination   Initial Visit Note   06/15/2022 Name: Jordan [name] MRN: [id number] DOB: [birth year]  Jordan [name] is a 76 y.o. year old male who sees [name], FNP for primary care. I spoke with  Fae Pippin by phone today.  What matters to the patients health and wellness today?  No concerns today.  States he checks his B/P twice a week with ranges 120-150/75.    Goals Addressed             This Visit's Progress    COMPLETED: Care Coordination Activities - no follow up required       Care Coordination Interventions: Provided education to patient re: care coordination services Assessed social determinant of health barriers          SDOH assessments and interventions completed:  Yes  SDOH Interventions Today    Flowsheet Row Most Recent Value  SDOH Interventions   Food Insecurity Interventions Intervention Not Indicated  Housing Interventions Intervention Not Indicated  Transportation Interventions Intervention Not Indicated  Utilities Interventions Intervention Not Indicated        Care Coordination Interventions:  Yes, provided   Follow up plan: No further intervention required.   Encounter Outcome:  Pt. Visit Completed  Peter Garter RN, BSN,CCM, CDE Care Management Coordinator [place] [facility] [phone]

## 2022-07-13 DIAGNOSIS — R399 Unspecified symptoms and signs involving the genitourinary system: Secondary | ICD-10-CM | POA: Diagnosis not present

## 2022-07-30 ENCOUNTER — Ambulatory Visit (INDEPENDENT_AMBULATORY_CARE_PROVIDER_SITE_OTHER): Payer: Medicare Other | Admitting: Internal Medicine

## 2022-07-30 ENCOUNTER — Encounter: Payer: Self-pay | Admitting: Internal Medicine

## 2022-07-30 VITALS — BP 146/90 | HR 66 | Temp 97.5°F | Ht 67.0 in | Wt 172.0 lb

## 2022-07-30 DIAGNOSIS — E785 Hyperlipidemia, unspecified: Secondary | ICD-10-CM | POA: Diagnosis not present

## 2022-07-30 DIAGNOSIS — G4709 Other insomnia: Secondary | ICD-10-CM | POA: Diagnosis not present

## 2022-07-30 DIAGNOSIS — G4700 Insomnia, unspecified: Secondary | ICD-10-CM | POA: Insufficient documentation

## 2022-07-30 DIAGNOSIS — R42 Dizziness and giddiness: Secondary | ICD-10-CM | POA: Insufficient documentation

## 2022-07-30 DIAGNOSIS — Z9889 Other specified postprocedural states: Secondary | ICD-10-CM

## 2022-07-30 DIAGNOSIS — G2581 Restless legs syndrome: Secondary | ICD-10-CM | POA: Diagnosis not present

## 2022-07-30 DIAGNOSIS — N400 Enlarged prostate without lower urinary tract symptoms: Secondary | ICD-10-CM | POA: Insufficient documentation

## 2022-07-30 DIAGNOSIS — N401 Enlarged prostate with lower urinary tract symptoms: Secondary | ICD-10-CM

## 2022-07-30 DIAGNOSIS — K222 Esophageal obstruction: Secondary | ICD-10-CM | POA: Insufficient documentation

## 2022-07-30 DIAGNOSIS — G622 Polyneuropathy due to other toxic agents: Secondary | ICD-10-CM | POA: Diagnosis not present

## 2022-07-30 DIAGNOSIS — R972 Elevated prostate specific antigen [PSA]: Secondary | ICD-10-CM | POA: Insufficient documentation

## 2022-07-30 DIAGNOSIS — Z9049 Acquired absence of other specified parts of digestive tract: Secondary | ICD-10-CM | POA: Diagnosis not present

## 2022-07-30 DIAGNOSIS — K9433 Esophagostomy malfunction: Secondary | ICD-10-CM | POA: Diagnosis not present

## 2022-07-30 DIAGNOSIS — I10 Essential (primary) hypertension: Secondary | ICD-10-CM

## 2022-07-30 DIAGNOSIS — R1310 Dysphagia, unspecified: Secondary | ICD-10-CM | POA: Insufficient documentation

## 2022-07-30 DIAGNOSIS — G619 Inflammatory polyneuropathy, unspecified: Secondary | ICD-10-CM | POA: Diagnosis not present

## 2022-07-30 DIAGNOSIS — R351 Nocturia: Secondary | ICD-10-CM | POA: Diagnosis not present

## 2022-07-30 DIAGNOSIS — K5900 Constipation, unspecified: Secondary | ICD-10-CM | POA: Insufficient documentation

## 2022-07-30 DIAGNOSIS — Z1211 Encounter for screening for malignant neoplasm of colon: Secondary | ICD-10-CM | POA: Insufficient documentation

## 2022-07-30 DIAGNOSIS — J690 Pneumonitis due to inhalation of food and vomit: Secondary | ICD-10-CM | POA: Insufficient documentation

## 2022-07-30 HISTORY — DX: Pneumonitis due to inhalation of food and vomit: J69.0

## 2022-07-30 MED ORDER — PREGABALIN 100 MG PO CAPS: 100.0000 mg | ORAL_CAPSULE | Freq: Two times a day (BID) | ORAL | 1 refills | Status: DC

## 2022-07-30 MED ORDER — ROPINIROLE HCL ER 8 MG PO TB24
8.0000 mg | ORAL_TABLET | Freq: Every day | ORAL | 2 refills | Status: DC
Start: 2022-07-30 — End: 2022-08-22

## 2022-07-30 NOTE — Assessment & Plan Note (Signed)
Insomnia is poorly controlled for 6 months and the cause is not totally clear but seems mainly due to restless legs and neuropathy.  Sleep apnea screening is borderline with a STOP-BANG score of 4 so I did recommend that he see a sleep specialist and get a sleep study.  I also offered to treat restless legs and neuropathy with Requip and gabapentin respectively.  He would rather avoid gabapentin due to side effects and it was ineffective previously.  He opted to wait and see if the Requip is helpful before we set up the sleep study he will call me and let me know.  Put a sleep hygiene handout for him and offered to refer him to behavioral health as cognitive behavioral therapy is now considered a first-line treatment for insomnia-but I doubt it is going to be that helpful for him considering that his specific type of insomnia seems to be mediated by neuro apathy and restless legs.  After I did not make a strong recommendation for it he declined

## 2022-07-30 NOTE — Progress Notes (Signed)
[place]  Phone: [phone]  New patient visit  Visit Date: 07/30/2022 Patient: Jordan [name]   DOB: 1945-11-11   77 y.o. Male  MRN: [id number]  Today's healthcare provider: Loralee Pacas, MD  Assessment and Plan:   Transfer of care today was following with [facility] and would like to see MD instead of FNP  Joevany was seen today for new patient (initial visit).  H/O esophagectomy Overview: Stomach now in chest and gets reflux pneumonia once F/w Dr. Benson Norway gastrointestinal   Orders: -     Lipid panel -     Magnesium  Mechanical complication of esophagostomy ([place]) Overview: Has reflux symptoms where stomach is up in chest But he is trying to limit omeprazole   Other insomnia Overview: Due to restless legs, neuropathy, no anxiety.   Sometimes wakes up to pee. Takes 30 minutes sleep latency STOP-Bang Score Do you snore loudly?: Yes Do you often feel tired, fatigued, or sleepy during the daytime?: Yes Has anyone observed you stop breathing during sleep?: No Do you have (or are you being treated for) high blood pressure?: Yes Recent BMI (Calculated):  BMI Readings from Last 1 Encounters:  07/30/22 26.94 kg/m   Is BMI greater than 35 kg/m2?:  No Age older than 77 years old?: Yes Has large neck size > 40 cm (15.7 in, large male shirt size, large male collar size > 16): No, barely 16 Gender Male?:  Yes STOP-Bang Total Score(total yes answers):  4   Assessment & Plan: Insomnia is poorly controlled for 6 months and the cause is not totally clear but seems mainly due to restless legs and neuropathy.  Sleep apnea screening is borderline with a STOP-BANG score of 4 so I did recommend that he see a sleep specialist and get a sleep study.  I also offered to treat restless legs and neuropathy with Requip and gabapentin respectively.  He would rather avoid gabapentin due to side effects and it was ineffective previously.  He opted to wait and see if the Requip  is helpful before we set up the sleep study he will call me and let me know.  Put a sleep hygiene handout for him and offered to refer him to behavioral health as cognitive behavioral therapy is now considered a first-line treatment for insomnia-but I doubt it is going to be that helpful for him considering that his specific type of insomnia seems to be mediated by neuro apathy and restless legs.  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  Dispense: 20 capsule; Refill: 1 -     B12 and Folate Panel -     NCV with EMG(electromyography); Future -     CBC -     Comprehensive metabolic panel  PSA elevation  Benign prostatic hyperplasia with nocturia  Restless legs syndrome -  rOPINIRole HCl ER; Take 1 tablet (8 mg total) by mouth at bedtime.  Dispense: 30 tablet; Refill: 2  Hyperlipidemia, acquired Overview: mild  Orders: -     Lipid panel  Hypertension, unspecified type Assessment & Plan: Borderline nearly controlled, currently associated with poor sleep last few months but he prefers to hold off on sleep study History orthostatic lability limitts goal Will follow up soon and continue same medications for now:  - Continue losartan at current dose 50 mg daily -Continue BP log -Take 75 mg if >170         Subjective:  Patient presents today to establish care.  Chief Complaint  Patient presents with   New  Patient (Initial Visit)    Problem-oriented charting was used to develop and update his medical history: Problem  H/O Esophagectomy   Stomach now in chest and gets reflux pneumonia once F/w Dr. Benson Norway gastrointestinal    Mechanical Complication of Esophagostomy ([facility])   Has reflux symptoms where stomach is up in chest But he is trying to limit omeprazole   Insomnia   Due to restless legs, neuropathy, no anxiety.   Sometimes wakes up to pee. Takes 30 minutes sleep latency STOP-Bang Score Do you snore loudly?: Yes Do you often feel tired, fatigued, or sleepy during the daytime?: Yes Has anyone observed you stop breathing during sleep?: No Do you have (or are you being treated for) high blood pressure?: Yes Recent BMI (Calculated):  BMI Readings from Last 1 Encounters:  07/30/22 26.94 kg/m   Is BMI greater than 35 kg/m2?:  No Age older than 77 years old?: Yes Has large neck size > 40 cm (15.7 in, large male shirt size, large male collar size > 16): No, barely 16 Gender Male?:  Yes STOP-Bang Total Score(total yes answers):  4    Psa Elevation  Bph (Benign Prostatic Hyperplasia)  Restless Legs Syndrome  Hyperlipidemia, Acquired   mild   Hypertension  Inflammatory and Toxic Neuropathy ([facility])   The neuropathy not certain but he says it may be started around the time of his back surgery and is associated with severe knifelike stabbing pain in the top of his right foot mainly and left foot too Gabapentin was in effective and he did not like the side effects Times to your balm helps aloe usually helps most other creams have been unhelpful He has not yet tried Lyrica He used to take the B12 but quit    Pneumonitis Due to Inhalation of Food Or Vomitus ([facility]cc) (Resolved)  Screening for Malignant Neoplasm of Colon (Resolved)  Barrett's Esophagus With Dysplasia (Resolved)  Productive Cough (Resolved)     Depression Screen    07/30/2022    4:09 PM  PHQ 2/9 Scores  PHQ - 2 Score 0    No results found for any visits on 07/30/22.  The following were reviewed and entered/updated into his [place] History:  Diagnosis Date   Allergy    Arthritis    back, shoulder- both    Barrett's esophagus    Barrett's esophagus with dysplasia 07/24/2021   BPH (benign prostatic hyperplasia)    Chronic lower back pain    Herniated disc at L4   Chronic rhinitis    ED (erectile dysfunction) of organic origin    GERD (gastroesophageal reflux disease)    Barrett's Esophagus, treated /w esophagectomy - 1998, continues toi have reflux & uses Zantac on a regular basis    Hypertension 10/20/21   Neuromuscular disorder (  [place]) 2011   Tingling feet   Pneumonitis due to inhalation of food or vomitus ([place]) 07/30/2022   Thyroid disease    Tinnitus    hearing loss in both ears    Past Surgical History:  Procedure Laterality Date   CATARACT EXTRACTION Bilateral 2019   Dr. Kathlen Mody   ESOPHAGECTOMY     1998- at [facility], /w epidural & sedation & pain management /w continued  epidural    EYE SURGERY  2018   Cataracts   HERNIA REPAIR     LUMBAR LAMINECTOMY/DECOMPRESSION MICRODISCECTOMY  03/22/2012   Procedure: LUMBAR LAMINECTOMY/DECOMPRESSION MICRODISCECTOMY 1 LEVEL;  Surgeon: Eustace Moore, MD;  Location: [facility];  Service: Neurosurgery;  Laterality: Right;  right lumbar four-five extra-foraminal microdiscectomy   SPINE SURGERY  2013   Ruptured disc L4   Family History  Problem Relation Age of Onset   Hypertension Mother    Alcohol abuse Mother    Arthritis Mother    Cancer Mother    Hypertension Father    Cancer Father    Heart attack Father    Alcohol abuse Father    Stroke Father    Allergic rhinitis Neg Hx    Asthma Neg Hx    Eczema Neg Hx    Urticaria Neg Hx    Outpatient Medications Prior to Visit  Medication Sig Dispense Refill   albuterol (VENTOLIN HFA) 108 (90 Base) MCG/ACT inhaler Inhale 2 puffs into the lungs every 6 (six) hours as needed for  wheezing or shortness of breath. 1 Inhaler 1   alfuzosin (UROXATRAL) 10 MG 24 hr tablet Take 10 mg by mouth as needed.     HYDROcodone-acetaminophen (NORCO/VICODIN) 5-325 MG per tablet Take 1 tablet by mouth every 6 (six) hours as needed for moderate pain or severe pain.     ibuprofen (ADVIL) 200 MG tablet Take 200 mg by mouth 4 (four) times daily. As needed     losartan (COZAAR) 50 MG tablet Take 50 mg by mouth daily as needed (BP>120).     montelukast (SINGULAIR) 10 MG tablet Take 10 mg by mouth as needed.     omeprazole (PRILOSEC) 40 MG capsule Take 40 mg by mouth as needed.     Saw Palmetto, Serenoa repens, (SAW PALMETTO PO) Take 1 capsule by mouth daily. 450 MG     sildenafil (REVATIO) 20 MG tablet Take 20 mg by mouth daily as needed (hypertension).     cetirizine (ZYRTEC) 10 MG tablet Take 10 mg by mouth daily as needed for allergies. (Patient not taking: Reported on 07/30/2022)     cholecalciferol (VITAMIN D3) 25 MCG (1000 UNIT) tablet Take 1,000 Units by mouth daily.     Multiple Vitamins-Minerals (ZINC PO) Take 1 tablet by mouth daily.     pyridOXINE (VITAMIN B6) 100 MG tablet Take 100 mg by mouth daily.     vitamin B-12 (CYANOCOBALAMIN) 100 MCG tablet Take 100 mcg by mouth daily.     No facility-administered medications prior to visit.    Allergies  Allergen Reactions   Adhesive [Tape] Rash   Social History   Tobacco Use   Smoking status: Never   Smokeless tobacco: Never   Tobacco comments:    Never smoked  Vaping Use   Vaping Use: Never used  Substance Use Topics   Alcohol use: Yes    Alcohol/week: 5.0 standard drinks of alcohol    Types: 2 Glasses of wine, 3 Cans of beer per week    Comment:  once a week- 2 beers   Drug use: No     There is no immunization history on file for this patient.   His main issue is gastroesophageal reflux disease and complex reflux with recent pneumonia associated that is fully recovered. Other major issue today is insomnia  Objective:   BP (!) 146/90 (BP Location: Right Arm, Patient Position: Sitting)   Pulse 66   Temp (!) 97.5 F (36.4 C) (Temporal)   Ht 5' 7"$  (1.702 m)   Wt 172 lb (78 kg)   SpO2 94%   BMI 26.94 kg/m  Body mass index is 26.94 kg/m. indicates this is an  Overweight male , but waist circumference is a better indicator of healthy body composition. Physical Exam  Vital signs reviewed.  Nursing notes reviewed. General Appearance/Constitutional:  polite male in no acute distress Musculoskeletal: All extremities are intact.  Neurological:  Awake, alert,  No obvious focal neurological deficits or cognitive impairments Psychiatric:  Appropriate mood, pleasant demeano    Results Reviewed: Results for orders placed or performed during the hospital encounter of 02/15/22  Comprehensive metabolic panel  Result Value Ref Range   Sodium 141 135 - 145 mmol/L   Potassium 5.2 (H) 3.5 - 5.1 mmol/L   Chloride 108 98 - 111 mmol/L   CO2 28 22 - 32 mmol/L   Glucose, Bld 118 (H) 70 - 99 mg/dL   BUN 24 (H) 8 - 23 mg/dL   Creatinine, Ser 1.01 0.61 - 1.24 mg/dL   Calcium 10.2 8.9 - 10.3 mg/dL   Total Protein 6.8 6.5 - 8.1 g/dL   Albumin 4.2 3.5 - 5.0 g/dL   AST 21 15 - 41 U/L   ALT 21 0 - 44 U/L   Alkaline Phosphatase 50 38 - 126 U/L   Total Bilirubin 1.8 (H) 0.3 - 1.2 mg/dL   GFR, Estimated >60 >60 mL/min   Anion gap 5 5 - 15  CBC with Differential  Result Value Ref Range   WBC 8.1 4.0 - 10.5 K/uL   RBC 3.91 (L) 4.22 - 5.81 MIL/uL   Hemoglobin 12.7 (L) 13.0 - 17.0 g/dL   HCT 37.4 (L) 39.0 - 52.0 %   MCV 95.7 80.0 - 100.0 fL   MCH 32.5 26.0 - 34.0 pg   MCHC 34.0 30.0 - 36.0 g/dL   RDW 13.1 11.5 - 15.5 %   Platelets 190 150 - 400 K/uL   nRBC 0.0 0.0 - 0.2 %   Neutrophils Relative % 63 %   Neutro Abs 5.0 1.7 - 7.7 K/uL   Lymphocytes Relative 27 %   Lymphs Abs 2.2 0.7 - 4.0 K/uL   Monocytes Relative 6 %   Monocytes Absolute 0.5 0.1 - 1.0 K/uL   Eosinophils Relative 3 %   Eosinophils Absolute 0.3 0.0 -  0.5 K/uL   Basophils Relative 1 %   Basophils Absolute 0.0 0.0 - 0.1 K/uL   Immature Granulocytes 0 %   Abs Immature Granulocytes 0.03 0.00 - 0.07 K/uL  Protime-INR  Result Value Ref Range   Prothrombin Time 14.4 11.4 - 15.2 seconds   INR 1.1 0.8 - 1.2  Urinalysis, Routine w reflex microscopic Urine, Clean Catch  Result Value Ref Range   Color, Urine YELLOW YELLOW   APPearance CLEAR CLEAR   Specific Gravity, Urine 1.021 1.005 - 1.030   pH 5.0 5.0 - 8.0   Glucose, UA NEGATIVE NEGATIVE mg/dL   Hgb urine dipstick MODERATE (A) NEGATIVE   Bilirubin Urine  NEGATIVE NEGATIVE   Ketones, ur NEGATIVE NEGATIVE mg/dL   Protein, ur NEGATIVE NEGATIVE mg/dL   Nitrite NEGATIVE NEGATIVE   Leukocytes,Ua NEGATIVE NEGATIVE   RBC / HPF 0-5 0 - 5 RBC/hpf   WBC, UA 0-5 0 - 5 WBC/hpf   Bacteria, UA NONE SEEN NONE SEEN   Mucus PRESENT    Hyaline Casts, UA PRESENT   Type and screen [place]  Result Value Ref Range   ABO/RH(D) A POS    Antibody Screen NEG    Sample Expiration      02/18/2022,2359 Performed at [facility] [address], [place], [place] [address]

## 2022-07-30 NOTE — Assessment & Plan Note (Addendum)
Borderline nearly controlled, currently associated with poor sleep last few months but he prefers to hold off on sleep study History orthostatic lability limitts goal Will follow up soon and continue same medications for now:  - Continue losartan at current dose 50 mg daily -Continue BP log -Take 75 mg if >170

## 2022-08-02 ENCOUNTER — Ambulatory Visit (INDEPENDENT_AMBULATORY_CARE_PROVIDER_SITE_OTHER): Payer: Medicare Other | Admitting: Family Medicine

## 2022-08-02 ENCOUNTER — Other Ambulatory Visit (INDEPENDENT_AMBULATORY_CARE_PROVIDER_SITE_OTHER): Payer: Medicare Other

## 2022-08-02 ENCOUNTER — Encounter: Payer: Self-pay | Admitting: Family Medicine

## 2022-08-02 ENCOUNTER — Other Ambulatory Visit: Payer: Self-pay

## 2022-08-02 VITALS — BP 127/62 | HR 65 | Temp 97.4°F | Ht 67.0 in | Wt 175.4 lb

## 2022-08-02 DIAGNOSIS — G622 Polyneuropathy due to other toxic agents: Secondary | ICD-10-CM

## 2022-08-02 DIAGNOSIS — Z9889 Other specified postprocedural states: Secondary | ICD-10-CM

## 2022-08-02 DIAGNOSIS — K9433 Esophagostomy malfunction: Secondary | ICD-10-CM | POA: Diagnosis not present

## 2022-08-02 DIAGNOSIS — E785 Hyperlipidemia, unspecified: Secondary | ICD-10-CM

## 2022-08-02 DIAGNOSIS — J40 Bronchitis, not specified as acute or chronic: Secondary | ICD-10-CM

## 2022-08-02 DIAGNOSIS — Z9049 Acquired absence of other specified parts of digestive tract: Secondary | ICD-10-CM

## 2022-08-02 DIAGNOSIS — G619 Inflammatory polyneuropathy, unspecified: Secondary | ICD-10-CM

## 2022-08-02 LAB — COMPREHENSIVE METABOLIC PANEL
ALT: 12 U/L (ref 0–53)
AST: 15 U/L (ref 0–37)
Albumin: 4 g/dL (ref 3.5–5.2)
Alkaline Phosphatase: 56 U/L (ref 39–117)
BUN: 26 mg/dL — ABNORMAL HIGH (ref 6–23)
CO2: 27 mEq/L (ref 19–32)
Calcium: 9.4 mg/dL (ref 8.4–10.5)
Chloride: 107 mEq/L (ref 96–112)
Creatinine, Ser: 1.04 mg/dL (ref 0.40–1.50)
GFR: 69.91 mL/min (ref >60.00)
Glucose, Bld: 113 mg/dL — ABNORMAL HIGH (ref 70–99)
Potassium: 4.9 mEq/L (ref 3.5–5.1)
Sodium: 142 mEq/L (ref 135–145)
Total Bilirubin: 1 mg/dL (ref 0.2–1.2)
Total Protein: 6.3 g/dL (ref 6.0–8.3)

## 2022-08-02 LAB — LIPID PANEL
Cholesterol: 129 mg/dL (ref 0–200)
HDL: 48.3 mg/dL (ref >39.00)
LDL Cholesterol: 65 mg/dL (ref 0–99)
NonHDL: 80.5
Total CHOL/HDL Ratio: 3
Triglycerides: 77 mg/dL (ref 0.0–149.0)
VLDL: 15.4 mg/dL (ref 0.0–40.0)

## 2022-08-02 LAB — CBC WITH DIFFERENTIAL/PLATELET
Basophils Absolute: 0.1 10*3/uL (ref 0.0–0.1)
Basophils Relative: 0.8 % (ref 0.0–3.0)
Eosinophils Absolute: 0.4 10*3/uL (ref 0.0–0.7)
Eosinophils Relative: 5.1 % — ABNORMAL HIGH (ref 0.0–5.0)
HCT: 37.2 % — ABNORMAL LOW (ref 39.0–52.0)
Hemoglobin: 12 g/dL — ABNORMAL LOW (ref 13.0–17.0)
Lymphocytes Relative: 22.1 % (ref 12.0–46.0)
Lymphs Abs: 1.8 10*3/uL (ref 0.7–4.0)
MCHC: 32.2 g/dL (ref 30.0–36.0)
MCV: 84.5 fl (ref 78.0–100.0)
Monocytes Absolute: 0.7 10*3/uL (ref 0.1–1.0)
Monocytes Relative: 8 % (ref 3.0–12.0)
Neutro Abs: 5.3 10*3/uL (ref 1.4–7.7)
Neutrophils Relative %: 64 % (ref 43.0–77.0)
Platelets: 220 10*3/uL (ref 150.0–400.0)
RBC: 4.4 Mil/uL (ref 4.22–5.81)
RDW: 17.5 % — ABNORMAL HIGH (ref 11.5–15.5)
WBC: 8.4 10*3/uL (ref 4.0–10.5)

## 2022-08-02 LAB — B12 AND FOLATE PANEL
Folate: 11.2 ng/mL (ref >5.9)
Vitamin B-12: 446 pg/mL (ref 211–911)

## 2022-08-02 LAB — MAGNESIUM: Magnesium: 1.9 mg/dL (ref 1.5–2.5)

## 2022-08-02 MED ORDER — AZITHROMYCIN 250 MG PO TABS: ORAL_TABLET | ORAL | 0 refills | Status: AC

## 2022-08-02 NOTE — Addendum Note (Signed)
Addended by: Doran Clay A on: 08/02/2022 09:00 AM   Modules accepted: Orders

## 2022-08-02 NOTE — Progress Notes (Signed)
Subjective:     Patient ID: Jordan [name], male    DOB: [birth year], 77 y.o.   MRN: [id number]  Chief Complaint  Patient presents with   Cough    Pt states sx started week ago, and getting worse. States feel like bronchitis, had bronchitis before in the past. Pt states just got over pneumonia 3-4 weeks ago    HPI Cough, congestion for 1 wk-clear phlegm.  Getting worse. Bad in am.  Had pneumonia 3-4 wks ago.  No f/c. No sob/wheeze. Some runny nose/congestion.   H/o Barrett's w/high dysplasia so esosogectomy in past  Health Maintenance Due  Topic Date Due   Zoster Vaccines- Shingrix (1 of 2) Never done   Medicare Annual Wellness (AWV)  09/28/2022    Past Medical History:  Diagnosis Date   Allergy    Arthritis    back, shoulder- both    Barrett's esophagus    Barrett's esophagus with dysplasia 07/24/2021   BPH (benign prostatic hyperplasia)    Chronic lower back pain    Herniated disc at L4   Chronic rhinitis    ED (erectile dysfunction) of organic origin    GERD (gastroesophageal reflux disease)    Barrett's Esophagus, treated /w esophagectomy - 1998, continues toi have reflux & uses Zantac on a regular basis    Hypertension 10/20/21   Neuromuscular disorder ([place]) 2011   Tingling feet   Pneumonitis due to inhalation of food or vomitus ([place]) 07/30/2022   Thyroid disease    Tinnitus    hearing loss in both ears     Past Surgical History:  Procedure Laterality Date   CATARACT EXTRACTION Bilateral 2019   Dr. Kathlen Mody   ESOPHAGECTOMY     1998- at [facility], /w epidural & sedation & pain management /w continued  epidural    EYE SURGERY  2018   Cataracts   HERNIA REPAIR     LUMBAR LAMINECTOMY/DECOMPRESSION MICRODISCECTOMY  03/22/2012   Procedure: LUMBAR LAMINECTOMY/DECOMPRESSION MICRODISCECTOMY 1 LEVEL;  Surgeon: Eustace Moore, MD;  Location: [facility];  Service: Neurosurgery;  Laterality: Right;  right lumbar four-five extra-foraminal microdiscectomy   SPINE SURGERY  2013    Ruptured disc L4    Outpatient Medications Prior to Visit  Medication Sig Dispense Refill   albuterol (VENTOLIN HFA) 108 (90 Base) MCG/ACT inhaler Inhale 2 puffs into the lungs every 6 (six) hours as needed for wheezing or shortness of breath. 1 Inhaler 1   alfuzosin (UROXATRAL) 10 MG 24 hr tablet Take 10 mg by mouth as needed.     HYDROcodone-acetaminophen (NORCO/VICODIN) 5-325 MG per tablet Take 1 tablet by mouth every 6 (six) hours as needed for moderate pain or severe pain.     ibuprofen (ADVIL) 200 MG tablet Take 200 mg by mouth 4 (four) times daily. As needed     losartan (COZAAR) 50 MG tablet Take 50 mg by mouth daily as needed (BP>120).     montelukast (SINGULAIR) 10 MG tablet Take 10 mg by mouth as needed.     omeprazole (PRILOSEC) 40 MG capsule Take 40 mg by mouth as needed.     pregabalin (LYRICA) 100 MG capsule Take 1 capsule (100 mg total) by mouth 2 (two) times daily. 20 capsule 1   rOPINIRole (REQUIP XL) 8 MG 24 hr tablet Take 1 tablet (8 mg total) by mouth at bedtime. 30 tablet 2   Saw Palmetto, Serenoa repens, (SAW PALMETTO PO) Take 1 capsule by mouth daily. 450 MG  sildenafil (REVATIO) 20 MG tablet Take 20 mg by mouth daily as needed (hypertension).     cetirizine (ZYRTEC) 10 MG tablet Take 10 mg by mouth daily as needed for allergies. (Patient not taking: Reported on 07/30/2022)     No facility-administered medications prior to visit.    Allergies  Allergen Reactions   Adhesive [Tape] Rash   ROS neg/noncontributory except as noted HPI/below      Objective:     BP 127/62 (BP Location: Right Arm, Patient Position: Sitting)   Pulse 65   Temp (!) 97.4 F (36.3 C) (Temporal)   Ht 5' 7"$  (1.702 m)   Wt 175 lb 6.4 oz (79.6 kg)   SpO2 98%   BMI 27.47 kg/m  Wt Readings from Last 3 Encounters:  08/02/22 175 lb 6.4 oz (79.6 kg)  07/30/22 172 lb (78 kg)  12/04/21 163 lb 12.8 oz (74.3 kg)    Physical Exam   Gen: WDWN NAD HEENT: NCAT, conjunctiva not injected,  sclera nonicteric TM WNL B, OP moist, no exudates .  congested NECK:  supple, no thyromegaly, no nodes,  CARDIAC: RRR, S1S2+, no murmur. LUNGS: CTAB. No wheezes EXT:  no edema MSK: no gross abnormalities.  NEURO: A&O x3.  CN II-XII intact.  PSYCH: normal mood. Good eye contact     Assessment & Plan:   Problem List Items Addressed This Visit   None Visit Diagnoses     Bronchitis    -  Primary      Bronchitis-zpk.  Can use inhaler if needed.  Worse, etc, let us know  No orders of the defined types were placed in this encounter.   Wellington Hampshire, MD

## 2022-08-02 NOTE — Patient Instructions (Signed)
Sent in [place].   Worse, not improving, let us know

## 2022-08-02 NOTE — Addendum Note (Signed)
Addended by: Joanette Gula on: 08/02/2022 09:02 AM   Modules accepted: Orders

## 2022-08-13 ENCOUNTER — Other Ambulatory Visit: Payer: Self-pay | Admitting: Internal Medicine

## 2022-08-13 ENCOUNTER — Ambulatory Visit (INDEPENDENT_AMBULATORY_CARE_PROVIDER_SITE_OTHER)
Admission: RE | Admit: 2022-08-13 | Discharge: 2022-08-13 | Disposition: A | Discharge status: Home / Self Care | Payer: Medicare Other | Source: Ambulatory Visit | Attending: Internal Medicine | Admitting: Internal Medicine

## 2022-08-13 ENCOUNTER — Encounter: Payer: Self-pay | Admitting: Internal Medicine

## 2022-08-13 ENCOUNTER — Ambulatory Visit (INDEPENDENT_AMBULATORY_CARE_PROVIDER_SITE_OTHER): Payer: Medicare Other | Admitting: Internal Medicine

## 2022-08-13 VITALS — BP 142/88 | HR 65 | Temp 98.1°F | Ht 67.0 in | Wt 169.0 lb

## 2022-08-13 DIAGNOSIS — K2100 Gastro-esophageal reflux disease with esophagitis, without bleeding: Secondary | ICD-10-CM | POA: Diagnosis not present

## 2022-08-13 DIAGNOSIS — G2581 Restless legs syndrome: Secondary | ICD-10-CM

## 2022-08-13 DIAGNOSIS — G622 Polyneuropathy due to other toxic agents: Secondary | ICD-10-CM | POA: Diagnosis not present

## 2022-08-13 DIAGNOSIS — G629 Polyneuropathy, unspecified: Secondary | ICD-10-CM

## 2022-08-13 DIAGNOSIS — R059 Cough, unspecified: Secondary | ICD-10-CM | POA: Diagnosis not present

## 2022-08-13 DIAGNOSIS — J411 Mucopurulent chronic bronchitis: Secondary | ICD-10-CM

## 2022-08-13 DIAGNOSIS — J690 Pneumonitis due to inhalation of food and vomit: Secondary | ICD-10-CM

## 2022-08-13 DIAGNOSIS — N390 Urinary tract infection, site not specified: Secondary | ICD-10-CM

## 2022-08-13 DIAGNOSIS — J441 Chronic obstructive pulmonary disease with (acute) exacerbation: Secondary | ICD-10-CM

## 2022-08-13 DIAGNOSIS — R062 Wheezing: Secondary | ICD-10-CM | POA: Diagnosis not present

## 2022-08-13 DIAGNOSIS — G619 Inflammatory polyneuropathy, unspecified: Secondary | ICD-10-CM | POA: Diagnosis not present

## 2022-08-13 DIAGNOSIS — K449 Diaphragmatic hernia without obstruction or gangrene: Secondary | ICD-10-CM | POA: Diagnosis not present

## 2022-08-13 HISTORY — DX: Urinary tract infection, site not specified: N39.0

## 2022-08-13 MED ORDER — ALBUTEROL SULFATE (2.5 MG/3ML) 0.083% IN NEBU: 2.5000 mg | INHALATION_SOLUTION | Freq: Four times a day (QID) | RESPIRATORY_TRACT | 1 refills | Status: DC | PRN

## 2022-08-13 MED ORDER — PREDNISONE 20 MG PO TABS: ORAL_TABLET | ORAL | 0 refills | Status: DC

## 2022-08-13 MED ORDER — PREGABALIN 100 MG PO CAPS: 100.0000 mg | ORAL_CAPSULE | Freq: Two times a day (BID) | ORAL | 1 refills | Status: DC

## 2022-08-13 NOTE — Patient Instructions (Addendum)
It was a pleasure seeing you today!  Your health and satisfaction are my top priorities. If you believe your experience today was worthy of a 5-star rating, I'd be grateful for your feedback! Loralee Pacas, MD   In short all you need to do is make a 1 month follow-up then go get the x-ray then stop by the pharmacy.  They will call you about the lung specialist and the speech specialist.  Pay close attention to whether you are accidentally taking any food or reflux down the wrong pipe  CHECKOUT CHECKLIST  '[]'$    Schedule next appointment(s):    Return in about 1 month (around 09/11/2022) for chronic disease monitoring and management.  Any requested lab visits should be scheduled as appointments too  If you are not doing well:  Return to the office sooner Please bring all your medicine bottles to each appointment If your condition begins to worsen or become severe:  go to the emergency room or even call 911  '[]'$    Sign release of information authorizations: Any records we need for your care and to be your medical home  REMINDERS:  '[]'$    Remember-try half or quarter dose of requip to avoid side effect(s)  '[]'$    X-rays can be obtained at the  [facility] office. You can walk in M-F between 8:30am- noon or 1pm - 5pm. Tell them you are there for xrays ordered by me. They will send me the results, then I will let you know the results with instructions. Address: [address].  The Xray department is located in the basement.     '[]'$   (Optional):  Review your clinical notes on MyChart after they are completed.     Today's draft of the physician documented plan for today's visit: (final revisions will be visible on MyChart chart later) Mucopurulent chronic bronchitis ([place]) -     Ambulatory referral to [place]; Future  Gastroesophageal reflux disease with esophagitis, unspecified whether hemorrhage Assessment & Plan: He is planning to  start a taper on his reflux medication today but I advised him actually to push it back until after we get this wheezing to stop because I think that could be from reflux and if that is the case I do not want to potentially get the lungs a second time with more reflux from trying to get down on the dose of the reflux meds  Orders: -     Ambulatory referral to [place]; Future  COPD exacerbation (HCC) -     Albuterol Sulfate; Take 3 mLs (2.5 mg total) by nebulization every 6 (six) hours as needed for wheezing or shortness of breath.  Dispense: 150 mL; Refill: 1 -     predniSONE; Take 2 pills for 3 days, 1 pill for 4 days For wheezing & cough, can cause heartburn/reflux so take pepcid complete for any indigestion.  Dispense: 10 tablet; Refill: 0 -     DG Chest 2 View; Future  Restless legs syndrome  Inflammatory and toxic neuropathy (HCC) -     Pregabalin; Take 1 capsule (100 mg total) by mouth 2 (two) times daily.  Dispense: 20 capsule; Refill: 1  Peripheral polyneuropathy Assessment & Plan: The Lyrica what we did not go through and was not filled so I am sending it again today we are using this  because gabapentin had too many side effects for him does have a very disabled neuropathy and I need to learn more about the cause but I do want him also taking B12 every day   Aspiration pneumonia of right lower lobe, unspecified aspiration pneumonia type ([place]) -     Ambulatory referral to Pulmonology -     [place]; Future      QUESTIONS & CONCERNS: CLINICAL: please contact us via phone [phone] OR MyChart messaging  LAB & IMAGING:   We will call you if the results are significantly abnormal or you don't use MyChart.  Most normal results will be posted to MyChart immediately and have a clinical review message by Dr. Randol Kern posted within 2-3 business days.   If you have not heard from [place] regarding the  results in 2 weeks OR if you need priority reporting, please contact this office. MYCHART:  The fastest way to get your results and easiest way to stay in touch with [place] is by activating your My Chart account. Instructions are located on the last page of this paperwork.  BILLING: xray and lab orders are billed from separate companies and questions./concerns should be directed to the [facility].  For visit charges please discuss with our administrative services COMPLAINTS:  please let Dr. Randol Kern know or see the [facility], by asking at the front desk: we want you to be satisfied with every experience and we would be grateful for the opportunity to address any problems

## 2022-08-13 NOTE — Progress Notes (Signed)
[facility]: [id number]   Routine Medical Office Visit  Patient:  Jordan [name]      Age: 77 y.o.       Sex:  male  Date:   08/13/2022  PCP:    Loralee Pacas, [place] Provider: Loralee Pacas, MD   Problem Focused Charting:   Medical Decision Making per Assessment/Plan   Joandy was seen today for onngoing bronchitis.  Mucopurulent chronic bronchitis ([place]) -     Ambulatory referral to Pulmonology -     [place]; Future  Gastroesophageal reflux disease with esophagitis, unspecified whether hemorrhage Assessment & Plan: He is planning to start a taper on his reflux medication today but I advised him actually to push it back until after we get this wheezing to stop because I think that could be from reflux and if that is the case I do not want to potentially get the lungs a second time with more reflux from trying to get down on the dose of the reflux meds  Orders: -     Ambulatory referral to [place]; Future  COPD exacerbation (HCC) -     Albuterol Sulfate; Take 3 mLs (2.5 mg total) by nebulization every 6 (six) hours as needed for wheezing or shortness of breath.  Dispense: 150 mL; Refill: 1 -     DG Chest 2 View; Future -     predniSONE; Take 2 pills for 3 days, 1 pill for 4 days For wheezing & cough, can cause heartburn/reflux so take pepcid complete for any indigestion.  Dispense: 10 tablet; Refill: 0  Restless legs syndrome Assessment & Plan: He had dizziness and discontinued requip but he had tried at the 8 mg dose. I encouraged him to retry with half or quarter dose.  Suspect(s) this is secondary to neuropathy.     Inflammatory and toxic neuropathy (HCC) -     Pregabalin; Take 1 capsule (100 mg total) by mouth 2 (two) times daily.  Dispense: 20 capsule; Refill: 1  Peripheral polyneuropathy Overview: The neuropathy not certain but he says it may be  started around the time of his back surgery and is associated with severe knifelike stabbing pain in the top of his right foot mainly and left foot too Gabapentin was in effective and he did not like the side effects tiger balm helps aloe usually helps most other creams have been unhelpful He has not yet tried Lyrica He used to take the B12 but quit    Assessment & Plan: The Lyrica what we did not go through and was not filled so I am sending it again today we are using this because gabapentin had too many side effects for him does have a very disabled neuropathy and I need to learn more about the cause but I did encourage  him resume taking B12 every day   Considering neurology referral but will wait on electromyogram which is pending   Aspiration pneumonia of right lower lobe, unspecified aspiration pneumonia type ([place]) -     Ambulatory referral to Pulmonology -     [place]; Future   Medical Decision Making: [Medical Problems Considered] 1 or more chronic illnesses with exacerbation,  progression, or side effects of treatment 2 or more stable chronic illnesses [Risk Management] Prescription drug management       Subjective -  Clinical Presentation:   Jordan [name] is a 77 y.o. male  Patient Active Problem List   Diagnosis Date Noted   Urinary tract infectious disease 08/13/2022   Peripheral polyneuropathy 08/13/2022   Constipation 07/30/2022   Dysphagia 07/30/2022   Dizziness 07/30/2022   Stricture of esophagus 07/30/2022   H/O esophagectomy 07/30/2022   Mechanical complication of esophagostomy ([place]) 07/30/2022   Insomnia 07/30/2022   PSA elevation 07/30/2022   BPH (benign prostatic hyperplasia) 07/30/2022   Restless legs syndrome 07/30/2022   Hyperlipidemia, acquired 07/30/2022   Hypertension 12/11/2021   Chronic pain 07/24/2021   Displacement of lumbar intervertebral disc without myelopathy 07/24/2021   Hypertensive retinopathy  07/24/2021   Lower urinary tract symptoms due to benign prostatic hyperplasia 07/24/2021   Mixed hyperlipidemia 07/24/2021   Chronic bronchitis ([place]) 07/24/2021   GERD (gastroesophageal reflux disease) 11/11/2018   Hemoptysis 11/11/2018   Chronic sinusitis 11/11/2018   Back pain at L4-L5 level 07/23/2016   Neuropathy 06/18/2016   Allergies 06/18/2009   Past Medical History:  Diagnosis Date   Allergy    Arthritis    back, shoulder- both    Barrett's esophagus    Barrett's esophagus with dysplasia 07/24/2021   BPH (benign prostatic hyperplasia)    Chronic lower back pain    Herniated disc at L4   Chronic rhinitis    ED (erectile dysfunction) of organic origin    GERD (gastroesophageal reflux disease)    Barrett's Esophagus, treated /w esophagectomy - 1998, continues toi have reflux & uses Zantac on a regular basis    History of wheezing 11/11/2018   Hypertension 10/20/21   Neuromuscular disorder ([place]) 2011   Tingling feet   Pneumonitis due to inhalation of food or vomitus ([place]) 07/30/2022   Thyroid disease    Tinnitus    hearing loss in both ears     Outpatient Medications Prior to Visit  Medication Sig   acetaminophen (TYLENOL) 500 MG tablet Take by mouth.   albuterol (VENTOLIN HFA) 108 (90 Base) MCG/ACT inhaler Inhale 2 puffs into the lungs every 6 (six) hours as needed for wheezing or shortness of breath.   alfuzosin (UROXATRAL) 10 MG 24 hr tablet Take 10 mg by mouth as needed.   Famotidine-Ca Carb-Mag Hydrox (PEPCID COMPLETE PO) Take by mouth daily.   HYDROcodone-acetaminophen (NORCO/VICODIN) 5-325 MG per tablet Take 1 tablet by mouth every 6 (six) hours as needed for moderate pain or severe pain.   ibuprofen (ADVIL) 200 MG tablet Take 200 mg by mouth 4 (four) times daily. As needed   ibuprofen (ADVIL) 800 MG tablet Take by mouth.   losartan (COZAAR) 50 MG tablet Take 50 mg by mouth daily as needed (BP>120).   montelukast (SINGULAIR) 10 MG tablet Take 10 mg by mouth as  needed.   omeprazole (PRILOSEC) 20 MG capsule Take by mouth daily.   rOPINIRole (REQUIP XL) 8 MG 24 hr tablet Take 1 tablet (8 mg total) by mouth at bedtime.   Saw Palmetto, Serenoa repens, (SAW PALMETTO PO) Take 1 capsule by mouth daily. 450 MG   sildenafil (REVATIO) 20 MG tablet Take 20 mg by mouth daily as needed (hypertension).   [DISCONTINUED] pregabalin (LYRICA) 100 MG capsule Take 1 capsule (100 mg total) by mouth 2 (two) times daily.   [DISCONTINUED] omeprazole (PRILOSEC) 40 MG capsule Take 40 mg by mouth as needed.   No facility-administered medications prior to visit.    Chief Complaint  Patient presents with   Onngoing bronchitis  HPI  Main issue of concern is recurring lung and respiratory symptom(s) associated with gastroesophageal reflux disease and although he never senses aspiration he has been hospitalized for it and having a flare again of these respiratory symptom(s) today.  He is due for lung XR repeat anyway for post pneumonia and now has no respiratory symptom(s) wheezing/coughing.  No history smoking, doesn't following with pulmonary. Recent labwork reviewed and negative.        Objective:  Physical Exam  BP (!) 142/88 (BP Location: Right Arm, Patient Position: Sitting)   Pulse 65   Temp 98.1 F (36.7 C) (Temporal)   Ht '5\' 7"'$  (1.702 m)   Wt 169 lb (76.7 kg)   SpO2 95%   BMI 26.47 kg/m   Overweight  by BMI criteria but truncal adiposity (waist circumference or caliper) should be used instead. Wt Readings from Last 10 Encounters:  08/13/22 169 lb (76.7 kg)  08/02/22 175 lb 6.4 oz (79.6 kg)  07/30/22 172 lb (78 kg)  12/04/21 163 lb 12.8 oz (74.3 kg)  11/11/18 166 lb 6.4 oz (75.5 kg)  02/03/15 165 lb (74.8 kg)  03/21/12 165 lb (74.8 kg)   Vital signs reviewed.  Nursing notes reviewed. Weight trend reviewed. General Appearance:  Well developed, well nourished male in no acute distress.   Normal work of breathing at rest Musculoskeletal: All extremities  are intact.  Neurological:  Awake, alert,  No obvious focal neurological deficits or cognitive impairments Psychiatric:  Appropriate mood, pleasant demeanor Problem-specific findings:  lungs are wheezy all fields. Right lower lobe sound crackly and worse.   Results Reviewed: No results found for any visits on 08/13/22.  Recent Results (from the past 2160 hour(s))  Magnesium     Status: None   Collection Time: 08/02/22  9:00 AM  Result Value Ref Range   Magnesium 1.9 1.5 - 2.5 mg/dL  Lipid panel     Status: None   Collection Time: 08/02/22  9:00 AM  Result Value Ref Range   Cholesterol 129 0 - 200 mg/dL    Comment: ATP III Classification       Desirable:  < 200 mg/dL               Borderline High:  200 - 239 mg/dL          High:  > = 240 mg/dL   Triglycerides 77.0 0.0 - 149.0 mg/dL    Comment: Normal:  <150 mg/dLBorderline High:  150 - 199 mg/dL   HDL 48.30 >39.00 mg/dL   VLDL 15.4 0.0 - 40.0 mg/dL   LDL Cholesterol 65 0 - 99 mg/dL   Total CHOL/HDL Ratio 3     Comment:                Men          Women1/2 Average Risk     3.4          3.3Average Risk          5.0          4.42X Average Risk          9.6          7.13X Average Risk          15.0          11.0                       NonHDL 80.50     Comment: NOTE:  Non-HDL goal should be 30 mg/dL higher than patient's LDL goal (i.e. LDL goal of < 70 mg/dL, would have non-HDL goal of < 100 mg/dL)  Comp Met (CMET)     Status: Abnormal   Collection Time: 08/02/22  9:00 AM  Result Value Ref Range   Sodium 142 135 - 145 mEq/L   Potassium 4.9 3.5 - 5.1 mEq/L   Chloride 107 96 - 112 mEq/L   CO2 27 19 - 32 mEq/L   Glucose, Bld 113 (H) 70 - 99 mg/dL   BUN 26 (H) 6 - 23 mg/dL   Creatinine, Ser 1.04 0.40 - 1.50 mg/dL   Total Bilirubin 1.0 0.2 - 1.2 mg/dL   Alkaline Phosphatase 56 39 - 117 U/L   AST 15 0 - 37 U/L   ALT 12 0 - 53 U/L   Total Protein 6.3 6.0 - 8.3 g/dL   Albumin 4.0 3.5 - 5.2 g/dL   GFR 69.91 >60.00 mL/min    Comment:  Calculated using the CKD-EPI Creatinine Equation (2021)   Calcium 9.4 8.4 - 10.5 mg/dL  CBC with Differential/Platelet     Status: Abnormal   Collection Time: 08/02/22  9:00 AM  Result Value Ref Range   WBC 8.4 4.0 - 10.5 K/uL   RBC 4.40 4.22 - 5.81 Mil/uL   Hemoglobin 12.0 (L) 13.0 - 17.0 g/dL   HCT 37.2 (L) 39.0 - 52.0 %   MCV 84.5 78.0 - 100.0 fl   MCHC 32.2 30.0 - 36.0 g/dL   RDW 17.5 (H) 11.5 - 15.5 %   Platelets 220.0 150.0 - 400.0 K/uL   Neutrophils Relative % 64.0 43.0 - 77.0 %   Lymphocytes Relative 22.1 12.0 - 46.0 %   Monocytes Relative 8.0 3.0 - 12.0 %   Eosinophils Relative 5.1 (H) 0.0 - 5.0 %   Basophils Relative 0.8 0.0 - 3.0 %   Neutro Abs 5.3 1.4 - 7.7 K/uL   Lymphs Abs 1.8 0.7 - 4.0 K/uL   Monocytes Absolute 0.7 0.1 - 1.0 K/uL   Eosinophils Absolute 0.4 0.0 - 0.7 K/uL   Basophils Absolute 0.1 0.0 - 0.1 K/uL  B12 and Folate Panel     Status: None   Collection Time: 08/02/22  9:00 AM  Result Value Ref Range   Vitamin B-12 446 211 - 911 pg/mL   Folate 11.2 >5.9 ng/mL          Signed: Loralee Pacas, MD 08/13/2022 7:25 PM

## 2022-08-13 NOTE — Assessment & Plan Note (Addendum)
The Lyrica what we did not go through and was not filled so I am sending it again today we are using this because gabapentin had too many side effects for him does have a very disabled neuropathy and I need to learn more about the cause but I did encourage  him resume taking B12 every day   Considering neurology referral but will wait on electromyogram which is pending

## 2022-08-13 NOTE — Assessment & Plan Note (Signed)
He is planning to start a taper on his reflux medication today but I advised him actually to push it back until after we get this wheezing to stop because I think that could be from reflux and if that is the case I do not want to potentially get the lungs a second time with more reflux from trying to get down on the dose of the reflux meds

## 2022-08-13 NOTE — Assessment & Plan Note (Signed)
He had dizziness and discontinued requip but he had tried at the 8 mg dose. I encouraged him to retry with half or quarter dose.  Suspect(s) this is secondary to neuropathy.

## 2022-08-14 ENCOUNTER — Other Ambulatory Visit: Payer: Self-pay | Admitting: Internal Medicine

## 2022-08-14 DIAGNOSIS — J690 Pneumonitis due to inhalation of food and vomit: Secondary | ICD-10-CM

## 2022-08-14 DIAGNOSIS — K2100 Gastro-esophageal reflux disease with esophagitis, without bleeding: Secondary | ICD-10-CM

## 2022-08-14 NOTE — Progress Notes (Signed)
Ordered [id number] to evaluate for concern of aspiration pneumonia from silent aspiration.

## 2022-08-16 ENCOUNTER — Telehealth: Payer: Self-pay | Admitting: Internal Medicine

## 2022-08-16 DIAGNOSIS — J690 Pneumonitis due to inhalation of food and vomit: Secondary | ICD-10-CM

## 2022-08-16 NOTE — Telephone Encounter (Signed)
Patient states: - He is on day 4 of medication regimen that PCP ordered on 2/26 following OV  - He is not feeling any better, will be going out of town tomorrow morning   Patient requests any medications/recommendations from PCP that could help with symptoms. Please Advise.

## 2022-08-22 ENCOUNTER — Telehealth (HOSPITAL_COMMUNITY): Payer: Self-pay

## 2022-08-22 ENCOUNTER — Ambulatory Visit (INDEPENDENT_AMBULATORY_CARE_PROVIDER_SITE_OTHER): Payer: Medicare Other | Admitting: Internal Medicine

## 2022-08-22 ENCOUNTER — Encounter: Payer: Self-pay | Admitting: Internal Medicine

## 2022-08-22 VITALS — BP 118/70 | HR 71 | Temp 97.9°F | Ht 66.0 in | Wt 168.2 lb

## 2022-08-22 DIAGNOSIS — K219 Gastro-esophageal reflux disease without esophagitis: Secondary | ICD-10-CM | POA: Diagnosis not present

## 2022-08-22 DIAGNOSIS — Z9889 Other specified postprocedural states: Secondary | ICD-10-CM | POA: Diagnosis not present

## 2022-08-22 DIAGNOSIS — Z9049 Acquired absence of other specified parts of digestive tract: Secondary | ICD-10-CM | POA: Diagnosis not present

## 2022-08-22 NOTE — Progress Notes (Signed)
Jordan [name]    [id number]    12-May-1946  Primary Care Physician:Morrison, Julien Nordmann, MD  Referring Physician: Loralee Pacas, [address] [place],  [place] [address] Reason for Consultation: chronic bronchitis and gerd Date of Consultation: 08/22/2022  Chief complaint:   Chief Complaint  Patient presents with   Consult    Chronic bronchitis, Jerrye Bushy       HPI: Jordan [name] is a 77 y.o. man who presents for new patient evaluation of chronic bronchitis and reflux.   He has a history of esophageal cancer 30 years ago s/p esophagectomy and gastric pull through. Has had no issues with reflux until about 3 years ago. Surgery was done at [facility]. Sees Dr. Benson Norway at [facility]  He has episodes now of burning in his chest which leads to phlegm buildup, especially with eating. This eventually leads to episodes of coughing, wheezing. Episodes have become more frequent  He was taking omeprazole 40 mg once daily with tums. He has recently changed to omeprazole 20 mg with pepcid and this has helped a little (made the change a week ago.)  Episodes usually happen at night while sleeping. He takes some tums, drinks some water, and the episodes pass. He sleeps laying flat. Spicy foods trigger him, alcohol, chocolate. Eliminating these foods has helped. Treating episodes with steroids and abx has helped.   He has seen ENT in the past as well. Had pneumonia about 6 weeks treated with antibiotics at home. No childhood respiratory disease. No dyspnea.   Social history:  Occupation: retired, Art gallery manager Exposures: lives at home with wife.  Smoking history: never smoker  Social History   Occupational History   Occupation: Chief Financial Officer    Comment: Retired  Tobacco Use   Smoking status: Never   Smokeless tobacco: Never   Tobacco comments:    Never smoked  Vaping Use   Vaping Use: Never used  Substance and Sexual Activity   Alcohol use: Yes    Alcohol/week: 5.0  standard drinks of alcohol    Types: 2 Glasses of wine, 3 Cans of beer per week    Comment: once a week- 2 beers   Drug use: No   Sexual activity: Yes    Birth control/protection: Surgical, None    Relevant family history:  Family History  Problem Relation Age of Onset   Hypertension Mother    Alcohol abuse Mother    Arthritis Mother    Cancer Mother    Hypertension Father    Cancer Father    Heart attack Father    Alcohol abuse Father    Stroke Father    Allergic rhinitis Neg Hx    Asthma Neg Hx    Eczema Neg Hx    Urticaria Neg Hx     Past Medical History:  Diagnosis Date   Allergy    Arthritis    back, shoulder- both    Barrett's esophagus    Barrett's esophagus with dysplasia 07/24/2021   BPH (benign prostatic hyperplasia)    Chronic lower back pain    Herniated disc at L4   Chronic rhinitis    ED (erectile dysfunction) of organic origin    GERD (gastroesophageal reflux disease)    Barrett's Esophagus, treated /w esophagectomy - 1998, continues toi have reflux & uses Zantac on a regular basis    History of wheezing 11/11/2018   Hypertension 10/20/21   Neuromuscular disorder ([place]) 2011   Tingling  feet   Pneumonitis due to inhalation of food or vomitus ([place]) 07/30/2022   Thyroid disease    Tinnitus    hearing loss in both ears     Past Surgical History:  Procedure Laterality Date   CATARACT EXTRACTION Bilateral 2019   Dr. Kathlen Mody   ESOPHAGECTOMY     [facility]- [facility], /w epidural & sedation & pain management /w continued  epidural    EYE SURGERY  2018   Cataracts   HERNIA REPAIR     LUMBAR LAMINECTOMY/DECOMPRESSION MICRODISCECTOMY  03/22/2012   Procedure: LUMBAR LAMINECTOMY/DECOMPRESSION MICRODISCECTOMY 1 LEVEL;  Surgeon: Eustace Moore, MD;  Location: [facility];  Service: Neurosurgery;  Laterality: Right;  right lumbar four-five extra-foraminal microdiscectomy   SPINE SURGERY  2013   Ruptured disc L4     Physical Exam: Blood pressure 118/70, pulse 71,  temperature 97.9 F (36.6 C), temperature source Oral, height '5\' 6"'$  (1.676 m), weight 168 lb 3.2 oz (76.3 kg), SpO2 96 %. Gen:      No acute distress ENT:  no nasal polyps, mucus membranes moist Lungs:    No increased respiratory effort, symmetric chest wall excursion, clear to auscultation bilaterally, no wheezes or crackles CV:         Regular rate and rhythm; no murmurs, rubs, or gallops.  No pedal edema Abd:      + bowel sounds; soft, non-tender; no distension MSK: no acute synovitis of DIP or PIP joints, no mechanics hands.  Skin:      Warm and dry; no rashes Neuro: normal speech, no focal facial asymmetry Psych: alert and oriented x3, normal mood and affect   Data Reviewed/Medical Decision Making:  Independent interpretation of tests: Imaging:  Review of patient's chest xray images Feb 2024 revealed no acute cardiopulmonary process. The patient's images have been independently reviewed by me.    PFTs:  Labs:  Lab Results  Component Value Date   NA 142 08/02/2022   K 4.9 08/02/2022   CO2 27 08/02/2022   GLUCOSE 113 (H) 08/02/2022   BUN 26 (H) 08/02/2022   CREATININE 1.04 08/02/2022   CALCIUM 9.4 08/02/2022   GFRNONAA >60 02/15/2022   Lab Results  Component Value Date   WBC 8.4 08/02/2022   HGB 12.0 (L) 08/02/2022   HCT 37.2 (L) 08/02/2022   MCV 84.5 08/02/2022   PLT 220.0 08/02/2022     Immunization status:  Immunization History  Administered Date(s) Administered   Influenza, High Dose Seasonal PF 04/12/2015   Influenza,inj,Quad PF,6-35 Mos 06/28/2011, 03/12/2014   Pneumococcal Conjugate-13 05/24/2017   Pneumococcal Polysaccharide-23 12/29/2012   Tdap 05/24/2017   Zoster, Live 06/28/2011     I reviewed prior external note(s) from GI, PCP, ED visits.   I reviewed the result(s) of the labs and imaging as noted above.   I have ordered    Assessment:  GERD Esophageal cancer s/p esophagectomy in late 1990s  Plan/Recommendations:  [name] has symptoms  of reflux and lower micro-aspiration occasionally causing respiratory episodes.   We discussed disease management and progression at length today regarding reflux management in the setting of his esophagectomy. Most of his treatment at this point is lifestyle modification given his altered upper GI anatomy. We discussed avoiding trigger foods, sleeping up right with wedge pillow or bed risers.   If future episodes would probably just treat with prednisone for inflammation as these are not likely to require antibiotics  Continue H2 blocker and PPI for now if they are  efficacious.  Follow up with GI if no resolution.   He doesn't appeared to have developed significant respiratory symptoms on a daily basis from these episodes.   I spent 45 minutes in the care of this patient today including pre-charting, chart review, review of results, face-to-face care, coordination of care and communication with consultants etc.).    Return to Care: Return if symptoms worsen or fail to improve.  Lenice Llamas, MD Pulmonary and Critical Care Medicine [place]  CC: Loralee Pacas, MD

## 2022-08-22 NOTE — Patient Instructions (Signed)
Follow up with me as needed!   I recommend avoiding some of the common triggers for reflux and I think he biggest intervention for your benefit will be sleeping with a wedge pillow or elevating the head of the bed somehow to help nighttime symptoms. Chest xray and lung exam were normal today.  What is GERD? Gastroesophageal reflux disease (GERD) is gastroesophageal reflux diseasewhich occurs when the lower esophageal sphincter (LES) opens spontaneously, for varying periods of time, or does not close properly and stomach contents rise up into the esophagus. GER is also called acid reflux or acid regurgitation, because digestive juices--called acids--rise up with the food. The esophagus is the tube that carries food from the mouth to the stomach. The LES is a ring of muscle at the bottom of the esophagus that acts like a valve between the esophagus and stomach.  When acid reflux occurs, food or fluid can be tasted in the back of the mouth. When refluxed stomach acid touches the lining of the esophagus it may cause a burning sensation in the chest or throat called heartburn or acid indigestion. Occasional reflux is common. Persistent reflux that occurs more than twice a week is considered GERD, and it can eventually lead to more serious health problems. People of all ages can have GERD. Studies have shown that GERD may worsen or contribute to asthma, chronic cough, and pulmonary fibrosis.   What are the symptoms of GERD? The main symptom of GERD in adults is frequent heartburn, also called acid indigestion--burning-type pain in the lower part of the mid-chest, behind the breast bone, and in the mid-abdomen.  Not all reflux is acidic in nature, and many patients don't have heart burn at all. Sometimes it feels like a cough (either dry or with mucus), choking sensation, asthma, shortness of breath, waking up at night, frequent throat clearing, or trouble swallowing.    What causes GERD? The reason some  people develop GERD is still unclear. However, research shows that in people with GERD, the LES relaxes while the rest of the esophagus is working. Anatomical abnormalities such as a hiatal hernia may also contribute to GERD. A hiatal hernia occurs when the upper part of the stomach and the LES move above the diaphragm, the muscle wall that separates the stomach from the chest. Normally, the diaphragm helps the LES keep acid from rising up into the esophagus. When a hiatal hernia is present, acid reflux can occur more easily. A hiatal hernia can occur in people of any age and is most often a normal finding in otherwise healthy people over age 23. Most of the time, a hiatal hernia produces no symptoms.   Other factors that may contribute to GERD include - Obesity or recent weight gain - Pregnancy  - Smoking  - Diet - Certain medications  Common foods that can worsen reflux symptoms include: - carbonated beverages - artificial sweeteners - citrus fruits  - chocolate  - drinks with caffeine or alcohol  - fatty and fried foods  - garlic and onions  - mint flavorings  - spicy foods  - tomato-based foods, like spaghetti sauce, salsa, chili, and pizza   Lifestyle Changes If you smoke, stop.  Avoid foods and beverages that worsen symptoms (see above.) Lose weight if needed.  Eat small, frequent meals.  Wear loose-fitting clothes.  Avoid lying down for 3 hours after a meal.  Raise the head of your bed 6 to 8 inches by securing wood blocks under the bedposts.  Just using extra pillows will not help, but using a wedge-shaped pillow may be helpful.  Medications  H2 blockers, such as cimetidine (Tagamet HB), famotidine (Pepcid AC), nizatidine (Axid AR), and ranitidine (Zantac 75), decrease acid production. They are available in prescription strength and over-the-counter strength. These drugs provide short-term relief and are effective for about half of those who have GERD symptoms.  Proton pump  inhibitors include omeprazole (Prilosec, Zegerid), lansoprazole (Prevacid), pantoprazole (Protonix), rabeprazole (Aciphex), and esomeprazole (Nexium), which are available by prescription. Prilosec is also available in over-the-counter strength. Proton pump inhibitors are more effective than H2 blockers and can relieve symptoms and heal the esophageal lining in almost everyone who has GERD.  Because drugs work in different ways, combinations of medications may help control symptoms. People who get heartburn after eating may take both antacids and H2 blockers. The antacids work first to neutralize the acid in the stomach, and then the H2 blockers act on acid production. By the time the antacid stops working, the H2 blocker will have stopped acid production. Your health care provider is the best source of information about how to use medications for GERD.   Points to Remember 1. You can have GERD without having heartburn. Your symptoms could include a dry cough, asthma symptoms, or trouble swallowing.  2. Taking medications daily as prescribed is important in controlling you symptoms.  Sometimes it can take up to 8 weeks to fully achieve the effects of the medications prescribed.  3. Coughing related to GERD can be difficult to treat and is very frustrating!  However, it is important to stick with these medications and lifestyle modifications before pursuing more aggressive or invasive test and treatments.

## 2022-08-22 NOTE — Telephone Encounter (Signed)
Called and spoke with patient to schedule Modified Barium Swallow - patient stated he does not need test at this time. Closed order.

## 2022-08-23 MED ORDER — AMOXICILLIN-POT CLAVULANATE 875-125 MG PO TABS: 1.0000 | ORAL_TABLET | Freq: Two times a day (BID) | ORAL | 0 refills | Status: DC

## 2022-08-23 NOTE — Telephone Encounter (Signed)
Sent message informing him that [facility] sent in Augmentin and it's side effects via my chart.

## 2022-09-11 ENCOUNTER — Encounter: Payer: Self-pay | Admitting: Internal Medicine

## 2022-09-11 ENCOUNTER — Ambulatory Visit (INDEPENDENT_AMBULATORY_CARE_PROVIDER_SITE_OTHER): Payer: Medicare Other | Admitting: Internal Medicine

## 2022-09-11 VITALS — BP 110/60 | HR 60 | Temp 97.3°F | Ht 66.0 in | Wt 168.8 lb

## 2022-09-11 DIAGNOSIS — Z9049 Acquired absence of other specified parts of digestive tract: Secondary | ICD-10-CM

## 2022-09-11 DIAGNOSIS — E785 Hyperlipidemia, unspecified: Secondary | ICD-10-CM | POA: Diagnosis not present

## 2022-09-11 DIAGNOSIS — N401 Enlarged prostate with lower urinary tract symptoms: Secondary | ICD-10-CM

## 2022-09-11 DIAGNOSIS — M545 Low back pain, unspecified: Secondary | ICD-10-CM

## 2022-09-11 DIAGNOSIS — Z9889 Other specified postprocedural states: Secondary | ICD-10-CM | POA: Diagnosis not present

## 2022-09-11 DIAGNOSIS — R1319 Other dysphagia: Secondary | ICD-10-CM | POA: Diagnosis not present

## 2022-09-11 DIAGNOSIS — K449 Diaphragmatic hernia without obstruction or gangrene: Secondary | ICD-10-CM

## 2022-09-11 DIAGNOSIS — H35039 Hypertensive retinopathy, unspecified eye: Secondary | ICD-10-CM

## 2022-09-11 DIAGNOSIS — G4709 Other insomnia: Secondary | ICD-10-CM

## 2022-09-11 DIAGNOSIS — G2581 Restless legs syndrome: Secondary | ICD-10-CM | POA: Diagnosis not present

## 2022-09-11 DIAGNOSIS — J411 Mucopurulent chronic bronchitis: Secondary | ICD-10-CM | POA: Diagnosis not present

## 2022-09-11 DIAGNOSIS — R972 Elevated prostate specific antigen [PSA]: Secondary | ICD-10-CM | POA: Diagnosis not present

## 2022-09-11 DIAGNOSIS — R400 Somnolence: Secondary | ICD-10-CM | POA: Diagnosis not present

## 2022-09-11 DIAGNOSIS — R42 Dizziness and giddiness: Secondary | ICD-10-CM

## 2022-09-11 DIAGNOSIS — R351 Nocturia: Secondary | ICD-10-CM

## 2022-09-11 DIAGNOSIS — I10 Essential (primary) hypertension: Secondary | ICD-10-CM

## 2022-09-11 DIAGNOSIS — G629 Polyneuropathy, unspecified: Secondary | ICD-10-CM | POA: Diagnosis not present

## 2022-09-11 MED ORDER — PREGABALIN 100 MG PO CAPS: 100.0000 mg | ORAL_CAPSULE | Freq: Two times a day (BID) | ORAL | 2 refills | Status: DC

## 2022-09-11 MED ORDER — HYDROCODONE-ACETAMINOPHEN 5-325 MG PO TABS: 1.0000 | ORAL_TABLET | Freq: Four times a day (QID) | ORAL | 0 refills | Status: DC | PRN

## 2022-09-11 NOTE — Assessment & Plan Note (Signed)
Took over rare as needed hydrocodone prescribing Will continue(s) with efforts on Lyrica Offered physical therapy - declined for now

## 2022-09-11 NOTE — Assessment & Plan Note (Signed)
Advised patient to limit use of arsenic based herbal

## 2022-09-11 NOTE — Assessment & Plan Note (Signed)
Will try reordering Lyrica and fighting for prior authorization again We discussed that his back hasn't been imagined in a long time, but since not worsening he defers for now

## 2022-09-11 NOTE — Assessment & Plan Note (Signed)
Encouraged patient to continue with close blood pressure monitoring and use liquid IV if blood pressure falls low, alternatively could just drink something salty

## 2022-09-11 NOTE — Patient Instructions (Signed)
It was a pleasure seeing you today!  Your health and satisfaction are my top priorities. If you believe your experience today was worthy of a 5-star rating, I'd be grateful for your feedback! Loralee Pacas, MD   CHECKOUT CHECKLIST  []    Schedule next appointment(s):    No follow-ups on file.  As needed and next wellness visit. Any requested lab visits should be scheduled as appointments too  If you are not doing well:  Return to the office sooner Please bring all your medicine bottles to each appointment If your condition begins to worsen or become severe:  go to the emergency room or even call 911  []    Sign release of information authorizations: Any records we need for your care and to be your medical home   Today's draft of the physician documented plan for today's visit: (final revisions will be visible on MyChart chart later) H/O esophagectomy  Mucopurulent chronic bronchitis (HCC)  Peripheral polyneuropathy -     HYDROcodone-Acetaminophen; Take 1 tablet by mouth every 6 (six) hours as needed for moderate pain or severe pain (do not drive while taking, do not mix with other sedatives such as etoh.).  Dispense: 45 tablet; Refill: 0  Restless legs syndrome Assessment & Plan: Advised patient to limit use of arsenic based herbal    Hyperlipidemia, acquired  Benign prostatic hyperplasia with nocturia  PSA elevation  Hypertension, unspecified type  Other insomnia -     Ambulatory referral to Sleep Studies  Drowsiness -     Ambulatory referral to Sleep Studies  Back pain at L4-L5 level Assessment & Plan: Took over rare as needed hydrocodone prescribing Will continue(s) with efforts on Lyrica Offered physical therapy - declined for now    Esophageal dysphagia  Dizziness Assessment & Plan: Encouraged patient to continue with close blood pressure monitoring and use liquid IV if blood pressure falls low, alternatively could just drink something  salty   Hypertensive retinopathy, unspecified laterality -     Ambulatory referral to Ophthalmology  Other orders -     Pregabalin; Take 1 capsule (100 mg total) by mouth 2 (two) times daily.  Dispense: 60 capsule; Refill: 2      QUESTIONS & CONCERNS: CLINICAL: please contact us via phone [phone] OR MyChart messaging  LAB & IMAGING:   We will call you if the results are significantly abnormal or you don't use MyChart.  Most normal results will be posted to MyChart immediately and have a clinical review message by Dr. Randol Kern posted within 2-3 business days.   If you have not heard from [place] regarding the results in 2 weeks OR if you need priority reporting, please contact this office. MYCHART:  The fastest way to get your results and easiest way to stay in touch with [place] is by activating your My Chart account. Instructions are located on the last page of this paperwork.  BILLING: xray and lab orders are billed from separate companies and questions./concerns should be directed to the [facility].  For visit charges please discuss with our administrative services COMPLAINTS:  please let Dr. Randol Kern know or see the [facility], by asking at the front desk: we want you to be satisfied with every experience and we would be grateful for the opportunity to address any problems

## 2022-09-11 NOTE — Progress Notes (Signed)
[facility]: [id number]   Routine Medical Office Visit  Patient:  Jordan [name]      Age: 77 y.o.       Sex:  male  Date:   09/11/2022  PCP:    Loralee Pacas, MD   [place] Provider: Loralee Pacas, MD   Assessment and Plan:   Peripheral polyneuropathy Assessment & Plan: Will try reordering Lyrica and fighting for prior authorization again We discussed that his back hasn't been imagined in a long time, but since not worsening he defers for now  Orders: -     HYDROcodone-Acetaminophen; Take 1 tablet by mouth every 6 (six) hours as needed for moderate pain or severe pain (do not drive while taking, do not mix with other sedatives such as etoh.).  Dispense: 45 tablet; Refill: 0 -     Pregabalin; Take 1 capsule (100 mg total) by mouth 2 (two) times daily.  Dispense: 60 capsule; Refill: 2  H/O esophagectomy  Mucopurulent chronic bronchitis (HCC)  Restless legs syndrome Assessment & Plan: Advised patient to limit use of arsenic based herbal    Hyperlipidemia, acquired  Benign prostatic hyperplasia with nocturia  PSA elevation  Hypertension, unspecified type  Other insomnia -     Ambulatory referral to Sleep Studies  Drowsiness -     Ambulatory referral to Sleep Studies  Back pain at L4-L5 level Assessment & Plan: Took over rare as needed hydrocodone prescribing Will continue(s) with efforts on Lyrica Offered physical therapy - declined for now    Esophageal dysphagia  Dizziness Assessment & Plan: Encouraged patient to continue with close blood pressure monitoring and use liquid IV if blood pressure falls low, alternatively could just drink something salty   Hypertensive retinopathy, unspecified laterality -     Ambulatory referral to Ophthalmology  Hiatal hernia Assessment & Plan: Large on DG Chest 2 View  Result Date: 08/14/2022 CLINICAL DATA:  Cough and wheezing. EXAM: CHEST - 2 VIEW COMPARISON:  Chest x-ray  05/31/2022. FINDINGS: The heart size and mediastinal contours are within normal limits. Both lungs are clear. Large hiatal hernia again noted with surgical clips in the mediastinum. The visualized skeletal structures are unremarkable. IMPRESSION: No active cardiopulmonary disease. Electronically Signed   By: Ronney Asters M.D.   On: 08/14/2022 22:52           Clinical Presentation:   77 y.o. male here today for 1 month follow-up for chronic disease and management (Complete resolution of chronic bronchitis flare so no follow up needed) and Peripheral Neuropathy (Prescribed Lyrica last visit but insurance denied and prior authorization efforts were being made.  )  Reviewed:  has a past medical history of Allergy, Arthritis, Barrett's esophagus, Barrett's esophagus with dysplasia (07/24/2021), BPH (benign prostatic hyperplasia), Chronic lower back pain, Chronic pain (07/24/2021), Chronic rhinitis, Chronic sinusitis (11/11/2018), ED (erectile dysfunction) of organic origin, GERD (gastroesophageal reflux disease), Hemoptysis (11/11/2018), History of wheezing (11/11/2018), Hypertension (10/20/21), Neuromuscular disorder ([place]) (2011), Neuropathy (06/18/2016), Pneumonitis due to inhalation of food or vomitus ([place]) (07/30/2022), Thyroid disease, Tinnitus, and Urinary tract infectious disease (08/13/2022). Active Ambulatory Problems    Diagnosis Date Noted   GERD (gastroesophageal reflux disease) 11/11/2018   Hypertension 12/11/2021   Allergies 06/18/2009   Back pain at L4-L5 level 07/23/2016   Displacement of lumbar intervertebral disc without myelopathy 07/24/2021   Dysphagia 07/30/2022   Dizziness 07/30/2022   Hypertensive retinopathy 07/24/2021   Lower urinary tract symptoms due to benign prostatic hyperplasia 07/24/2021  Mixed hyperlipidemia 07/24/2021   Stricture of esophagus 07/30/2022   Chronic bronchitis ([place]) 07/24/2021   H/O esophagectomy 07/30/2022   Mechanical complication of  esophagostomy ([place]) 07/30/2022   Insomnia 07/30/2022   PSA elevation 07/30/2022   BPH (benign prostatic hyperplasia) 07/30/2022   Restless legs syndrome 07/30/2022   Hyperlipidemia, acquired 07/30/2022   Peripheral polyneuropathy 08/13/2022   Hiatal hernia 09/11/2022   Resolved Ambulatory Problems    Diagnosis Date Noted   History of wheezing 11/11/2018   Hemoptysis 11/11/2018   Chronic sinusitis 11/11/2018   Barrett's esophagus with dysplasia 07/24/2021   Chronic pain 07/24/2021   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   albuterol (VENTOLIN HFA) 108 (90 Base) MCG/ACT inhaler Inhale 2 puffs into the lungs every 6 (six) hours as needed for wheezing or shortness of breath.   alfuzosin (UROXATRAL) 10 MG 24 hr tablet Take 10 mg by mouth as needed.   Famotidine-Ca Carb-Mag Hydrox (PEPCID COMPLETE PO) Take by mouth daily.   Homeopathic Products (RESTFUL LEGS PM) SUBL Place under the tongue at bedtime.   ibuprofen (ADVIL) 200 MG tablet Take 200 mg by mouth 4 (four) times daily. As needed   levalbuterol (XOPENEX) 1.25 MG/3ML nebulizer solution Take 1.25 mg by nebulization every 4 (four) hours as needed for wheezing.   losartan (COZAAR) 50 MG tablet Take 50 mg by mouth daily as needed (BP>120).   montelukast (SINGULAIR) 10 MG tablet Take 10 mg by mouth as needed.   omeprazole (PRILOSEC) 20 MG  capsule Take by mouth daily.   OVER THE COUNTER MEDICATION at bedtime. Nervive nerve relief PM.   Saw Palmetto, Serenoa repens, (SAW PALMETTO PO) Take 1 capsule by mouth daily. 450 MG   sildenafil (REVATIO) 20 MG tablet Take 20 mg by mouth daily as needed (hypertension).   [DISCONTINUED] amoxicillin-clavulanate (AUGMENTIN) 875-125 MG tablet Take 1 tablet by mouth 2 (two) times daily.   [DISCONTINUED] HYDROcodone-acetaminophen (NORCO/VICODIN) 5-325 MG per tablet Take 1 tablet by mouth every 6 (six) hours as needed for moderate pain or severe pain.   No facility-administered medications prior to visit.    HPI  Updated and modified:  Problem  Hiatal Hernia  Peripheral Polyneuropathy   Interim history 09/11/22:  started nervive thiamine b6 chamomile lavendar melatonin,Prescribed Lyrica last visit but insurance denied and prior authorization efforts were being made.    The neuropathy and restless legs syndrome  not certain but he says it may be started around the time of his back surgery and is associated with severe knifelike stabbing pain in the top of his right foot mainly and left foot too Gabapentin was in effective and he did not like the side effects tiger balm helps aloe usually helps most other creams have been unhelpful He has not yet tried Lyrica He used to take the B12 but quit  Dysphagia   Cancelled barium swallow after advised by pulmonary this is not correctable mechanical complication   Dizziness   Patient reports its due to low blood pressure    Psa Elevation   Never had biopsy  Following [facility] Does yearly PSA there, last PSA 3.6 and stable as of 2024   Bph (Benign Prostatic Hyperplasia)        Restless Legs Syndrome   Arsenic based hylands restful legs PM really works well for him since started. Requip made him feel like zombie even at the lower dose. No results found for: "FERRITIN"     Hyperlipidemia, Acquired   Lipid Panel      Component Value Date/Time   CHOL 129 08/02/2022 0900   TRIG 77.0 08/02/2022 0900   HDL 48.30 08/02/2022 0900   CHOLHDL 3 08/02/2022 0900   VLDL 15.4 08/02/2022 0900   LDLCALC 65 08/02/2022 0900  The ASCVD Risk score (Arnett DK, et al., 2019) failed to calculate for the following reasons:   The valid total cholesterol range is 130 to 320 mg/dL  After discussion of the treatment of hyperlipidemia, a statin-drug was not felt necessary 09/11/22    Hypertension   Current hypertension medications:       Sig   losartan (COZAAR) 50 MG tablet (Taking) Take 50 mg by mouth daily as needed (BP>120).   sildenafil (REVATIO) 20 MG tablet (Taking) Take 20 mg by mouth daily as needed (hypertension).       Patient reports compliance with current medications and no significant side effect(s)  but only taking losartan on as needed basis Home readings: checks daily, knows goal 140/90 BP Readings from Last 3 Encounters:  09/11/22 110/60  08/22/22 118/70  08/13/22 (!) 142/88        Hypertensive Retinopathy   Following with ophthalmology   Chronic Bronchitis (Hcc)   Recurrent due to aspirations from esophagectomy Saw pulmonology who advised this this common, and there is not any effective cure.- advised patient to raise head of bed, watch what he eats, take reflux medications consistently, avoid anything that causes reflux.   Back Pain At L4-L5 Level   Chronic low back pain Stable Has been taking 3-4 years hydrocodone rare as needed 45 tab annually    Urinary Tract Infectious Disease (Resolved)  Chronic Pain (Resolved)  Hemoptysis (Resolved)  Chronic Sinusitis (Resolved)  Neuropathy (Resolved)             Clinical Data Analysis:   Physical Exam  BP 110/60 (BP Location: Left Arm, Patient Position: Sitting)   Pulse 60   Temp (!) 97.3 F (36.3 C) (Temporal)   Ht 5\' 6"  (1.676 m)   Wt 168 lb 12.8 oz (76.6 kg)   SpO2 97%   BMI 27.25 kg/m  Wt Readings from Last 10 Encounters:   09/11/22 168 lb 12.8 oz (76.6 kg)  08/22/22 168 lb 3.2 oz (76.3 kg)  08/13/22 169 lb (76.7 kg)  08/02/22 175 lb 6.4 oz (79.6 kg)  07/30/22 172 lb (78 kg)  12/04/21 163 lb 12.8 oz (74.3 kg)  11/11/18 166 lb 6.4 oz (75.5 kg)  02/03/15 165 lb (74.8 kg)  03/21/12 165 lb (74.8 kg)   Vital signs reviewed.  Nursing notes reviewed. Weight trend reviewed. Abnormalities and Problem-Specific physical exam findings:  grimace with sit to stand and slow extension lumbar when unaware of observation General Appearance:  No acute distress appreciable.   Well-groomed, healthy-appearing male.  Well proportioned with no abnormal fat distribution.  Good muscle tone. Skin: Clear and well-hydrated. Pulmonary:  Normal work of breathing at rest, no respiratory distress apparent. SpO2: 97 %  Musculoskeletal: Patient demonstrates smooth and coordinated movements throughout all major joints. All extremities are intact.  Neurological:  Awake, alert, oriented, and engaged.  No obvious focal neurological deficits or cognitive impairments.  Sensorium seems unclouded. Gait is smooth and coordinated.  Speech is clear and coherent with logical content. Psychiatric:  Appropriate mood, pleasant and cooperative demeanor, cheerful and engaged during the exam    Additional Results Reviewed:     No results found for any visits on 09/11/22.  Recent Results (from the past 2160 hour(s))  Magnesium     Status: None   Collection Time: 08/02/22  9:00 AM  Result Value Ref Range   Magnesium 1.9 1.5 - 2.5 mg/dL  Lipid panel     Status: None   Collection Time: 08/02/22  9:00 AM  Result Value Ref Range   Cholesterol 129 0 - 200 mg/dL    Comment: ATP III Classification       Desirable:  < 200 mg/dL               Borderline High:  200 - 239 mg/dL          High:  > = 240 mg/dL   Triglycerides 77.0 0.0 - 149.0 mg/dL    Comment: Normal:  <150 mg/dLBorderline High:  150 - 199 mg/dL   HDL 48.30 >39.00 mg/dL   VLDL 15.4 0.0 - 40.0  mg/dL   LDL Cholesterol 65 0 - 99 mg/dL   Total CHOL/HDL Ratio 3     Comment:                Men          Women1/2 Average Risk     3.4          3.3Average Risk          5.0          4.42X Average Risk          9.6          7.13X Average Risk          15.0          11.0                       NonHDL 80.50     Comment: NOTE:  Non-HDL goal should be 30 mg/dL higher than patient's LDL goal (i.e. LDL goal of < 70 mg/dL, would have non-HDL goal of < 100 mg/dL)  Comp Met (CMET)     Status: Abnormal   Collection Time: 08/02/22  9:00 AM  Result Value Ref Range   Sodium 142 135 - 145 mEq/L   Potassium 4.9 3.5 - 5.1 mEq/L   Chloride 107 96 - 112 mEq/L   CO2 27 19 - 32 mEq/L   Glucose, Bld 113 (H) 70 - 99 mg/dL   BUN 26 (H) 6 - 23 mg/dL   Creatinine, Ser 1.04 0.40 - 1.50 mg/dL   Total Bilirubin 1.0 0.2 - 1.2 mg/dL   Alkaline Phosphatase 56 39 - 117 U/L   AST 15 0 - 37 U/L   ALT 12 0 - 53 U/L   Total Protein 6.3 6.0 - 8.3 g/dL   Albumin 4.0 3.5 - 5.2 g/dL   GFR 69.91 >60.00 mL/min    Comment: Calculated using the CKD-EPI Creatinine Equation (2021)  Calcium 9.4 8.4 - 10.5 mg/dL  CBC with Differential/Platelet     Status: Abnormal   Collection Time: 08/02/22  9:00 AM  Result Value Ref Range   WBC 8.4 4.0 - 10.5 K/uL   RBC 4.40 4.22 - 5.81 Mil/uL   Hemoglobin 12.0 (L) 13.0 - 17.0 g/dL   HCT 37.2 (L) 39.0 - 52.0 %   MCV 84.5 78.0 - 100.0 fl   MCHC 32.2 30.0 - 36.0 g/dL   RDW 17.5 (H) 11.5 - 15.5 %   Platelets 220.0 150.0 - 400.0 K/uL   Neutrophils Relative % 64.0 43.0 - 77.0 %   Lymphocytes Relative 22.1 12.0 - 46.0 %   Monocytes Relative 8.0 3.0 - 12.0 %   Eosinophils Relative 5.1 (H) 0.0 - 5.0 %   Basophils Relative 0.8 0.0 - 3.0 %   Neutro Abs 5.3 1.4 - 7.7 K/uL   Lymphs Abs 1.8 0.7 - 4.0 K/uL   Monocytes Absolute 0.7 0.1 - 1.0 K/uL   Eosinophils Absolute 0.4 0.0 - 0.7 K/uL   Basophils Absolute 0.1 0.0 - 0.1 K/uL  B12 and Folate Panel     Status: None   Collection Time: 08/02/22   9:00 AM  Result Value Ref Range   Vitamin B-12 446 211 - 911 pg/mL   Folate 11.2 >5.9 ng/mL    No image results found.   DG Chest 2 View  Result Date: 08/14/2022 CLINICAL DATA:  Cough and wheezing. EXAM: CHEST - 2 VIEW COMPARISON:  Chest x-ray 05/31/2022. FINDINGS: The heart size and mediastinal contours are within normal limits. Both lungs are clear. Large hiatal hernia again noted with surgical clips in the mediastinum. The visualized skeletal structures are unremarkable. IMPRESSION: No active cardiopulmonary disease. Electronically Signed   By: Ronney Asters M.D.   On: 08/14/2022 22:52     --------------------------------    Signed: Loralee Pacas, MD 09/11/2022 2:31 PM

## 2022-09-11 NOTE — Assessment & Plan Note (Signed)
Large on DG Chest 2 View  Result Date: 08/14/2022 CLINICAL DATA:  Cough and wheezing. EXAM: CHEST - 2 VIEW COMPARISON:  Chest x-ray 05/31/2022. FINDINGS: The heart size and mediastinal contours are within normal limits. Both lungs are clear. Large hiatal hernia again noted with surgical clips in the mediastinum. The visualized skeletal structures are unremarkable. IMPRESSION: No active cardiopulmonary disease. Electronically Signed   By: Ronney Asters M.D.   On: 08/14/2022 22:52

## 2022-10-09 ENCOUNTER — Ambulatory Visit (INDEPENDENT_AMBULATORY_CARE_PROVIDER_SITE_OTHER): Payer: Medicare Other

## 2022-10-09 VITALS — Wt 168.0 lb

## 2022-10-09 DIAGNOSIS — Z0000 Encounter for general adult medical examination without abnormal findings: Secondary | ICD-10-CM

## 2022-10-09 NOTE — Patient Instructions (Signed)
Jordan [name] , Thank you for taking time to come for your Medicare Wellness Visit. I appreciate your ongoing commitment to your health goals. Please review the following plan we discussed and let me know if I can assist you in the future.   These are the goals we discussed:  Goals      Patient Stated     None at this time         This is a list of the screening recommended for you and due dates:  Health Maintenance  Topic Date Due   Zoster (Shingles) Vaccine (1 of 2) 10/16/2022*   Flu Shot  01/17/2023   Medicare Annual Wellness Visit  10/09/2023   DTaP/Tdap/Td vaccine (2 - Td or Tdap) 05/25/2027   HPV Vaccine  Aged Out   Pneumonia Vaccine  Discontinued   COVID-19 Vaccine  Discontinued   Hepatitis C Screening: USPSTF Recommendation to screen - Ages 63-79 yo.  Discontinued  *Topic was postponed. The date shown is not the original due date.    Advanced directives: Please bring a copy of your health care power of attorney and living will to the office at your convenience.  Conditions/risks identified: none at this time   Next appointment: Follow up in one year for your annual wellness visit.   Preventive Care 47 Years and Older, Male  Preventive care refers to lifestyle choices and visits with your health care provider that can promote health and wellness. What does preventive care include? A yearly physical exam. This is also called an annual well check. Dental exams once or twice a year. Routine eye exams. Ask your health care provider how often you should have your eyes checked. Personal lifestyle choices, including: Daily care of your teeth and gums. Regular physical activity. Eating a healthy diet. Avoiding tobacco and drug use. Limiting alcohol use. Practicing safe sex. Taking low doses of aspirin every day. Taking vitamin and mineral supplements as recommended by your health care provider. What happens during an annual well check? The services and screenings done by  your health care provider during your annual well check will depend on your age, overall health, lifestyle risk factors, and family history of disease. Counseling  Your health care provider may ask you questions about your: Alcohol use. Tobacco use. Drug use. Emotional well-being. Home and relationship well-being. Sexual activity. Eating habits. History of falls. Memory and ability to understand (cognition). Work and work Astronomer. Screening  You may have the following tests or measurements: Height, weight, and BMI. Blood pressure. Lipid and cholesterol levels. These may be checked every 5 years, or more frequently if you are over 36 years old. Skin check. Lung cancer screening. You may have this screening every year starting at age 74 if you have a 30-pack-year history of smoking and currently smoke or have quit within the past 15 years. Fecal occult blood test (FOBT) of the stool. You may have this test every year starting at age 57. Flexible sigmoidoscopy or colonoscopy. You may have a sigmoidoscopy every 5 years or a colonoscopy every 10 years starting at age 57. Prostate cancer screening. Recommendations will vary depending on your family history and other risks. Hepatitis C blood test. Hepatitis B blood test. Sexually transmitted disease (STD) testing. Diabetes screening. This is done by checking your blood sugar (glucose) after you have not eaten for a while (fasting). You may have this done every 1-3 years. Abdominal aortic aneurysm (AAA) screening. You may need this if you are a current  or former smoker. Osteoporosis. You may be screened starting at age 62 if you are at high risk. Talk with your health care provider about your test results, treatment options, and if necessary, the need for more tests. Vaccines  Your health care provider may recommend certain vaccines, such as: Influenza vaccine. This is recommended every year. Tetanus, diphtheria, and acellular pertussis  (Tdap, Td) vaccine. You may need a Td booster every 10 years. Zoster vaccine. You may need this after age 92. Pneumococcal 13-valent conjugate (PCV13) vaccine. One dose is recommended after age 15. Pneumococcal polysaccharide (PPSV23) vaccine. One dose is recommended after age 58. Talk to your health care provider about which screenings and vaccines you need and how often you need them. This information is not intended to replace advice given to you by your health care provider. Make sure you discuss any questions you have with your health care provider. Document Released: 07/01/2015 Document Revised: 02/22/2016 Document Reviewed: 04/05/2015 Elsevier Interactive Patient Education  2017 [place] Prevention in the Home Falls can cause injuries. They can happen to people of all ages. There are many things you can do to make your home safe and to help prevent falls. What can I do on the outside of my home? Regularly fix the edges of walkways and driveways and fix any cracks. Remove anything that might make you trip as you walk through a door, such as a raised step or threshold. Trim any bushes or trees on the path to your home. Use bright outdoor lighting. Clear any walking paths of anything that might make someone trip, such as rocks or tools. Regularly check to see if handrails are loose or broken. Make sure that both sides of any steps have handrails. Any raised decks and porches should have guardrails on the edges. Have any leaves, snow, or ice cleared regularly. Use sand or salt on walking paths during winter. Clean up any spills in your garage right away. This includes oil or grease spills. What can I do in the bathroom? Use night lights. Install grab bars by the toilet and in the tub and shower. Do not use towel bars as grab bars. Use non-skid mats or decals in the tub or shower. If you need to sit down in the shower, use a plastic, non-slip stool. Keep the floor dry. Clean  up any water that spills on the floor as soon as it happens. Remove soap buildup in the tub or shower regularly. Attach bath mats securely with double-sided non-slip rug tape. Do not have throw rugs and other things on the floor that can make you trip. What can I do in the bedroom? Use night lights. Make sure that you have a light by your bed that is easy to reach. Do not use any sheets or blankets that are too big for your bed. They should not hang down onto the floor. Have a firm chair that has side arms. You can use this for support while you get dressed. Do not have throw rugs and other things on the floor that can make you trip. What can I do in the kitchen? Clean up any spills right away. Avoid walking on wet floors. Keep items that you use a lot in easy-to-reach places. If you need to reach something above you, use a strong step stool that has a grab bar. Keep electrical cords out of the way. Do not use floor polish or wax that makes floors slippery. If you must use wax,  use non-skid floor wax. Do not have throw rugs and other things on the floor that can make you trip. What can I do with my stairs? Do not leave any items on the stairs. Make sure that there are handrails on both sides of the stairs and use them. Fix handrails that are broken or loose. Make sure that handrails are as long as the stairways. Check any carpeting to make sure that it is firmly attached to the stairs. Fix any carpet that is loose or worn. Avoid having throw rugs at the top or bottom of the stairs. If you do have throw rugs, attach them to the floor with carpet tape. Make sure that you have a light switch at the top of the stairs and the bottom of the stairs. If you do not have them, ask someone to add them for you. What else can I do to help prevent falls? Wear shoes that: Do not have high heels. Have rubber bottoms. Are comfortable and fit you well. Are closed at the toe. Do not wear sandals. If you  use a stepladder: Make sure that it is fully opened. Do not climb a closed stepladder. Make sure that both sides of the stepladder are locked into place. Ask someone to hold it for you, if possible. Clearly mark and make sure that you can see: Any grab bars or handrails. First and last steps. Where the edge of each step is. Use tools that help you move around (mobility aids) if they are needed. These include: Canes. Walkers. Scooters. Crutches. Turn on the lights when you go into a dark area. Replace any light bulbs as soon as they burn out. Set up your furniture so you have a clear path. Avoid moving your furniture around. If any of your floors are uneven, fix them. If there are any pets around you, be aware of where they are. Review your medicines with your doctor. Some medicines can make you feel dizzy. This can increase your chance of falling. Ask your doctor what other things that you can do to help prevent falls. This information is not intended to replace advice given to you by your health care provider. Make sure you discuss any questions you have with your health care provider. Document Released: 03/31/2009 Document Revised: 11/10/2015 Document Reviewed: 07/09/2014 Elsevier Interactive Patient Education  2017 [employer].

## 2022-10-09 NOTE — Progress Notes (Signed)
I connected with  Jordan [name] on 10/09/22 by a audio enabled telemedicine application and verified that I am speaking with the correct person using two identifiers.  Patient Location: Home  Provider Location: Office/Clinic  I discussed the limitations of evaluation and management by telemedicine. The patient expressed understanding and agreed to proceed.    Patient Medicare AWV questionnaire was completed by the patient on 10/08/22; I have confirmed that all information answered by patient is correct and no changes since this date.      Subjective:   Jordan [name] is a 77 y.o. male who presents for Medicare Annual/Subsequent preventive examination.  Review of Systems     Cardiac Risk Factors include: advanced age (>36men, >55 women);male gender;dyslipidemia;hypertension     Objective:    Today's Vitals   10/09/22 1001  Weight: 168 lb (76.2 kg)   Body mass index is 27.12 kg/m.     10/09/2022   10:06 AM 02/15/2022    6:00 PM 03/22/2012    4:15 AM 03/21/2012   10:50 PM 03/21/2012    1:35 PM 03/21/2012    1:17 PM  Advanced Directives  Does Patient Have a Medical Advance Directive? Yes No Patient has advance directive, copy not in chart Patient has advance directive, copy not in chart Patient has advance directive, copy not in chart Patient does not have advance directive  Type of Advance Directive Healthcare Power of [place];Living will       Copy of Healthcare Power of Attorney in Chart? No - copy requested         Current Medications (verified) Outpatient Encounter Medications as of 10/09/2022  Medication Sig   acetaminophen (TYLENOL) 500 MG tablet Take by mouth.   albuterol (VENTOLIN HFA) 108 (90 Base) MCG/ACT inhaler Inhale 2 puffs into the lungs every 6 (six) hours as needed for wheezing or shortness of breath.   alfuzosin (UROXATRAL) 10 MG 24 hr tablet Take 10 mg by mouth as needed.   Famotidine-Ca Carb-Mag Hydrox (PEPCID COMPLETE PO) Take by mouth daily.   Homeopathic  Products (RESTFUL LEGS PM) SUBL Place under the tongue at bedtime.   HYDROcodone-acetaminophen (NORCO/VICODIN) 5-325 MG tablet Take 1 tablet by mouth every 6 (six) hours as needed for moderate pain or severe pain (do not drive while taking, do not mix with other sedatives such as etoh.).   ibuprofen (ADVIL) 200 MG tablet Take 200 mg by mouth 4 (four) times daily. As needed   levalbuterol (XOPENEX) 1.25 MG/3ML nebulizer solution Take 1.25 mg by nebulization every 4 (four) hours as needed for wheezing.   losartan (COZAAR) 50 MG tablet Take 50 mg by mouth daily as needed (BP>120).   montelukast (SINGULAIR) 10 MG tablet Take 10 mg by mouth as needed.   omeprazole (PRILOSEC) 20 MG capsule Take by mouth daily.   OVER THE COUNTER MEDICATION at bedtime. Nervive nerve relief PM.   pregabalin (LYRICA) 100 MG capsule Take 1 capsule (100 mg total) by mouth 2 (two) times daily.   Saw Palmetto, Serenoa repens, (SAW PALMETTO PO) Take 1 capsule by mouth daily. 450 MG   sildenafil (REVATIO) 20 MG tablet Take 20 mg by mouth daily as needed (hypertension).   No facility-administered encounter medications on file as of 10/09/2022.    Allergies (verified) Adhesive [tape]   History: Past Medical History:  Diagnosis Date   Allergy    Arthritis    back, shoulder- both    Barrett's esophagus    Barrett's esophagus with dysplasia 07/24/2021  BPH (benign prostatic hyperplasia)    Chronic lower back pain    Herniated disc at L4   Chronic pain 07/24/2021   Chronic rhinitis    Chronic sinusitis 11/11/2018   ED (erectile dysfunction) of organic origin    GERD (gastroesophageal reflux disease)    Barrett's Esophagus, treated /w esophagectomy - 1998, continues toi have reflux & uses Zantac on a regular basis    Hemoptysis 11/11/2018   History of wheezing 11/11/2018   Hypertension 10/20/21   Neuromuscular disorder 2011   Tingling feet   Neuropathy 06/18/2016   Pneumonitis due to inhalation of food or vomitus  07/30/2022   Thyroid disease    Tinnitus    hearing loss in both ears    Urinary tract infectious disease 08/13/2022   Past Surgical History:  Procedure Laterality Date   CATARACT EXTRACTION Bilateral 2019   Dr. Alben Spittle   ESOPHAGECTOMY     1998- at [facility], /w epidural & sedation & pain management /w continued  epidural    EYE SURGERY  2018   Cataracts   HERNIA REPAIR     LUMBAR LAMINECTOMY/DECOMPRESSION MICRODISCECTOMY  03/22/2012   Procedure: LUMBAR LAMINECTOMY/DECOMPRESSION MICRODISCECTOMY 1 LEVEL;  Surgeon: Tia Alert, MD;  Location: [facility];  Service: Neurosurgery;  Laterality: Right;  right lumbar four-five extra-foraminal microdiscectomy   SPINE SURGERY  2013   Ruptured disc L4   Family History  Problem Relation Age of Onset   Hypertension Mother    Alcohol abuse Mother    Arthritis Mother    Cancer Mother    Hypertension Father    Cancer Father    Heart attack Father    Alcohol abuse Father    Stroke Father    Allergic rhinitis Neg Hx    Asthma Neg Hx    Eczema Neg Hx    Urticaria Neg Hx    Social History   Socioeconomic History   Marital status: Married    Spouse name: Cornelia Easler   Number of children: 4   Years of education: Not on file   Highest education level: Bachelor's degree (e.g., BA, AB, BS)  Occupational History   Occupation: Art gallery manager    Comment: Retired  Tobacco Use   Smoking status: Never   Smokeless tobacco: Never   Tobacco comments:    Never smoked  Vaping Use   Vaping Use: Never used  Substance and Sexual Activity   Alcohol use: Yes    Alcohol/week: 5.0 standard drinks of alcohol    Types: 2 Glasses of wine, 3 Cans of beer per week    Comment: once a week- 2 beers   Drug use: No   Sexual activity: Yes    Birth control/protection: Surgical, None  Other Topics Concern   Not on file  Social History Narrative   Married father of 4: 3 girls and 1 boy.   Wife's name is Hospital doctor-   Retired Art gallery manager      No  routine exercise.   1-2 cups coffee a day.   2-3 beers a week   Social Determinants of Health   Financial Resource Strain: Low Risk  (10/08/2022)   Overall Financial Resource Strain (CARDIA)    Difficulty of Paying Living Expenses: Not very hard  Food Insecurity: No Food Insecurity (10/08/2022)   Hunger Vital Sign    Worried About Running Out of Food in the Last Year: Never true    Ran Out of Food in the Last Year: Never  true  Transportation Needs: No Transportation Needs (10/08/2022)   PRAPARE - Administrator, Civil Service (Medical): No    Lack of Transportation (Non-Medical): No  Physical Activity: Inactive (10/08/2022)   Exercise Vital Sign    Days of Exercise per Week: 0 days    Minutes of Exercise per Session: 0 min  Stress: No Stress Concern Present (10/08/2022)   [employer] of Occupational Health - Occupational Stress Questionnaire    Feeling of Stress : Not at all  Social Connections: Unknown (10/08/2022)   Social Connection and Isolation Panel [NHANES]    Frequency of Communication with Friends and Family: Once a week    Frequency of Social Gatherings with Friends and Family: Never    Attends Religious Services: Not on Marketing executive or Organizations: No    Attends Banker Meetings: Never    Marital Status: Married    Tobacco Counseling Counseling given: Not Answered Tobacco comments: Never smoked   Clinical Intake:  Pre-visit preparation completed: Yes  Pain : No/denies pain     BMI - recorded: 27.12 Nutritional Status: BMI 25 -29 Overweight Nutritional Risks: None Diabetes: No  How often do you need to have someone help you when you read instructions, pamphlets, or other written materials from your doctor or pharmacy?: 1 - Never  Diabetic?no  Interpreter Needed?: No  Information entered by :: Lanier Ensign, LPN   Activities of Daily Living    10/08/2022    5:35 PM  In your present state of health,  do you have any difficulty performing the following activities:  Hearing? 0  Vision? 0  Difficulty concentrating or making decisions? 0  Walking or climbing stairs? 0  Dressing or bathing? 0  Doing errands, shopping? 0  Preparing Food and eating ? N  Using the Toilet? N  In the past six months, have you accidently leaked urine? N  Do you have problems with loss of bowel control? N  Managing your Medications? N  Managing your Finances? N  Housekeeping or managing your Housekeeping? N    Patient Care Team: [name], MD as PCP - General (Internal Medicine) [name], MD as PCP - Cardiology (Cardiology) [name], MD as Consulting Physician (Gastroenterology) [name], MD as Consulting Physician (Pulmonary Disease)  Indicate any recent Medical Services you may have received from other than [facility] providers in the past year (date may be approximate).     Assessment:   This is a routine wellness examination for Jordan [name].  Hearing/Vision screen Hearing Screening - Comments:: Pt stated slight loss  Vision Screening - Comments:: Pt encouraged to follow up with eye provider   Dietary issues and exercise activities discussed: Current Exercise Habits: The patient does not participate in regular exercise at present   Goals Addressed             This Visit's Progress    Patient Stated       None at this time        Depression Screen    10/09/2022   10:05 AM 08/02/2022    8:07 AM 07/30/2022    4:09 PM  PHQ 2/9 Scores  PHQ - 2 Score 0 0 0  PHQ- 9 Score  3     Fall Risk    10/08/2022    5:35 PM 08/02/2022    8:07 AM 07/30/2022    4:09 PM  Fall Risk   Falls in the  past year? 0 0 0  Number falls in past yr: 0 0 0  Injury with Fall? 0 0 0  Risk for fall due to : Impaired vision No Fall Risks No Fall Risks  Follow up Falls prevention discussed Falls evaluation completed Falls evaluation completed    FALL RISK PREVENTION PERTAINING TO THE HOME:  Any  stairs in or around the home? Yes  If so, are there any without handrails? No  Home free of loose throw rugs in walkways, pet beds, electrical cords, etc? Yes  Adequate lighting in your home to reduce risk of falls? Yes   ASSISTIVE DEVICES UTILIZED TO PREVENT FALLS:  Life alert? No  Use of a cane, walker or w/c? No  Grab bars in the bathroom? No  Shower chair or bench in shower? No  Elevated toilet seat or a handicapped toilet? No   TIMED UP AND GO:  Was the test performed? No .   Cognitive Function:        10/09/2022   10:07 AM  6CIT Screen  What Year? 0 points  What month? 0 points  What time? 0 points  Count back from 20 0 points  Months in reverse 0 points  Repeat phrase 0 points  Total Score 0 points    Immunizations Immunization History  Administered Date(s) Administered   Influenza, High Dose Seasonal PF 04/12/2015   Influenza,inj,Quad PF,6-35 Mos 06/28/2011, 03/12/2014   Pneumococcal Conjugate-13 05/24/2017   Pneumococcal Polysaccharide-23 12/29/2012   Tdap 05/24/2017   Zoster, Live 06/28/2011    TDAP status: Up to date  Flu Vaccine status: Declined, Education has been provided regarding the importance of this vaccine but patient still declined. Advised may receive this vaccine at local pharmacy or Health Dept. Aware to provide a copy of the vaccination record if obtained from local pharmacy or Health Dept. Verbalized acceptance and understanding.  Pneumococcal vaccine status: Up to date  Covid-19 vaccine status: Declined, Education has been provided regarding the importance of this vaccine but patient still declined. Advised may receive this vaccine at local pharmacy or Health Dept.or vaccine clinic. Aware to provide a copy of the vaccination record if obtained from local pharmacy or Health Dept. Verbalized acceptance and understanding.  Qualifies for Shingles Vaccine? Yes   Zostavax completed No   Shingrix Completed?: No.    Education has been provided  regarding the importance of this vaccine. Patient has been advised to call insurance company to determine out of pocket expense if they have not yet received this vaccine. Advised may also receive vaccine at local pharmacy or Health Dept. Verbalized acceptance and understanding.  Screening Tests Health Maintenance  Topic Date Due   Zoster Vaccines- Shingrix (1 of 2) 10/16/2022 (Originally [birth year]1996)   INFLUENZA VACCINE  01/17/2023   Medicare Annual Wellness (AWV)  10/09/2023   DTaP/Tdap/Td (2 - Td or Tdap) 05/25/2027   HPV VACCINES  Aged Out   Pneumonia Vaccine 55+ Years old  Discontinued   COVID-19 Vaccine  Discontinued   Hepatitis C Screening  Discontinued    Health Maintenance  There are no preventive care reminders to display for this patient.   Pt stated colonoscopy completed will need to follow up    Additional Screening:  Hepatitis C Screening: does not qualify  Vision Screening: Recommended annual ophthalmology exams for early detection of glaucoma and other disorders of the eye. Is the patient up to date with their annual eye exam?  No  Who is the provider or what is  the name of the office in which the patient attends annual eye exams? Encouraged  If pt is not established with a provider, would they like to be referred to a provider to establish care? No .   Dental Screening: Recommended annual dental exams for proper oral hygiene  Community Resource Referral / Chronic Care Management: CRR required this visit?  No   CCM required this visit?  No      Plan:     I have personally reviewed and noted the following in the patient's chart:   Medical and social history Use of alcohol, tobacco or illicit drugs  Current medications and supplements including opioid prescriptions. Patient is currently taking opioid prescriptions. Information provided to patient regarding non-opioid alternatives. Patient advised to discuss non-opioid treatment plan with their  provider. Functional ability and status Nutritional status Physical activity Advanced directives List of other physicians Hospitalizations, surgeries, and ER visits in previous 12 months Vitals Screenings to include cognitive, depression, and falls Referrals and appointments  In addition, I have reviewed and discussed with patient certain preventive protocols, quality metrics, and best practice recommendations. A written personalized care plan for preventive services as well as general preventive health recommendations were provided to patient.     Marzella Schlein, LPN   09/24/8117   Nurse Notes: none

## 2022-10-12 ENCOUNTER — Encounter: Payer: Self-pay | Admitting: Internal Medicine

## 2022-10-22 ENCOUNTER — Ambulatory Visit: Payer: Medicare Other | Admitting: Internal Medicine

## 2022-10-23 ENCOUNTER — Institutional Professional Consult (permissible substitution): Payer: Medicare Other | Admitting: Neurology

## 2022-11-01 ENCOUNTER — Telehealth: Payer: Self-pay | Admitting: Pharmacist

## 2022-11-01 DIAGNOSIS — I10 Essential (primary) hypertension: Secondary | ICD-10-CM

## 2022-11-01 NOTE — Telephone Encounter (Signed)
PharmD reviewed patient chart to assess eligibility for [facility] Coord[facility] services. Patient was determined to be a good candidate for the program given the complexity of the medication regimen and/or overall risk for hospitalization and increased utilization.  Referral entered in order to outreach patient and offer appointment with PharmD. Referral cosigned to PCP.   Willa Frater, PharmD Clinical Pharmacist  [facility] [phone]

## 2022-12-11 DIAGNOSIS — Z6826 Body mass index (BMI) 26.0-26.9, adult: Secondary | ICD-10-CM | POA: Diagnosis not present

## 2022-12-11 DIAGNOSIS — M5416 Radiculopathy, lumbar region: Secondary | ICD-10-CM | POA: Diagnosis not present

## 2022-12-28 ENCOUNTER — Ambulatory Visit: Payer: Medicare Other | Admitting: Internal Medicine

## 2023-01-18 ENCOUNTER — Ambulatory Visit (INDEPENDENT_AMBULATORY_CARE_PROVIDER_SITE_OTHER): Payer: Medicare Other | Admitting: Internal Medicine

## 2023-01-18 ENCOUNTER — Other Ambulatory Visit: Payer: Self-pay | Admitting: Internal Medicine

## 2023-01-18 ENCOUNTER — Encounter: Payer: Self-pay | Admitting: Internal Medicine

## 2023-01-18 VITALS — BP 179/82 | HR 64 | Temp 98.2°F | Ht 66.0 in | Wt 173.0 lb

## 2023-01-18 DIAGNOSIS — R059 Cough, unspecified: Secondary | ICD-10-CM

## 2023-01-18 DIAGNOSIS — J418 Mixed simple and mucopurulent chronic bronchitis: Secondary | ICD-10-CM

## 2023-01-18 DIAGNOSIS — J441 Chronic obstructive pulmonary disease with (acute) exacerbation: Secondary | ICD-10-CM

## 2023-01-18 DIAGNOSIS — R5383 Other fatigue: Secondary | ICD-10-CM | POA: Diagnosis not present

## 2023-01-18 LAB — POC COVID19 BINAXNOW: SARS Coronavirus 2 Ag: NEGATIVE

## 2023-01-18 MED ORDER — BREZTRI AEROSPHERE 160-9-4.8 MCG/ACT IN AERO: 2.0000 | INHALATION_SPRAY | Freq: Two times a day (BID) | RESPIRATORY_TRACT | 1 refills | Status: DC

## 2023-01-18 MED ORDER — AZITHROMYCIN 250 MG PO TABS: ORAL_TABLET | ORAL | 0 refills | Status: DC

## 2023-01-18 MED ORDER — ALBUTEROL SULFATE HFA 108 (90 BASE) MCG/ACT IN AERS: 2.0000 | INHALATION_SPRAY | Freq: Four times a day (QID) | RESPIRATORY_TRACT | 1 refills | Status: DC | PRN

## 2023-01-18 MED ORDER — PREDNISONE 20 MG PO TABS: ORAL_TABLET | ORAL | 0 refills | Status: DC

## 2023-01-18 MED ORDER — AIRSUPRA 90-80 MCG/ACT IN AERO: 2.0000 | INHALATION_SPRAY | RESPIRATORY_TRACT | 11 refills | Status: DC | PRN

## 2023-01-18 NOTE — Progress Notes (Signed)
[facility]: [phone]   Routine Medical Office Visit  Patient:  Jordan [name]      Age: 77 y.o.       Sex:  male  Date:   01/18/2023 Patient Care Team: [name], MD as PCP - General (Internal Medicine) [name], MD as PCP - Cardiology (Cardiology) [name], MD as Consulting Physician (Gastroenterology) [name], MD as Consulting Physician (Pulmonary Disease) [name], MD as Consulting Physician (Neurosurgery) Today's Healthcare Provider: [name], MD   Assessment and Plan:   In my medical opinion this is a microaspiration induced copd/chronic bronchitis exacerbation visit.  Jordan [name] was seen today for possible bronchitis, severe chest congestion, productive cough and fatigue.  Cough, unspecified type -     POC COVID-19 BinaxNow -     Azithromycin; Take 2 tablets (500 mg) PO today, then 1 tablet (250 mg) PO daily x4 days.  Dispense: 6 tablet; Refill: 0 -     DG Chest 2 View; Future -     predniSONE; Take 2 pills for 3 days, 1 pill for 4 days  Dispense: 10 tablet; Refill: 0 -     Breztri Aerosphere; Inhale 2 puffs into the lungs in the morning and at bedtime.  Dispense: 1 each; Refill: 1 -     Albuterol Sulfate HFA; Inhale 2 puffs into the lungs every 6 (six) hours as needed for wheezing or shortness of breath.  Dispense: 1 each; Refill: 1  Other fatigue -     POC COVID-19 BinaxNow  Mixed simple and mucopurulent chronic bronchitis (HCC) Overview: Recurrent due to aspirations from esophagectomy Per pulmonary Dr. Celine Mans Jordan [name] has symptoms of reflux and lower micro-aspiration occasionally causing respiratory episodes.   We discussed disease management and progression at length today regarding reflux management in the setting of his esophagectomy. Most of his treatment at this point is lifestyle modification given his altered upper GI anatomy. We discussed avoiding trigger foods, sleeping up right with wedge pillow  or bed risers.   If future episodes would probably just treat with prednisone for inflammation as these are not likely to require antibiotics  Orders: -     Azithromycin; Take 2 tablets (500 mg) PO today, then 1 tablet (250 mg) PO daily x4 days.  Dispense: 6 tablet; Refill: 0 -     DG Chest 2 View; Future -     predniSONE; Take 2 pills for 3 days, 1 pill for 4 days  Dispense: 10 tablet; Refill: 0 -     Breztri Aerosphere; Inhale 2 puffs into the lungs in the morning and at bedtime.  Dispense: 1 each; Refill: 1 -     Albuterol Sulfate HFA; Inhale 2 puffs into the lungs every 6 (six) hours as needed for wheezing or shortness of breath.  Dispense: 1 each; Refill: 1  COPD exacerbation (HCC) -     Azithromycin; Take 2 tablets (500 mg) PO today, then 1 tablet (250 mg) PO daily x4 days.  Dispense: 6 tablet; Refill: 0 -     DG Chest 2 View; Future -     predniSONE; Take 2 pills for 3 days, 1 pill for 4 days  Dispense: 10 tablet; Refill: 0 -     Breztri Aerosphere; Inhale 2 puffs into the lungs in the morning and at bedtime.  Dispense: 1 each; Refill: 1 -     Albuterol Sulfate HFA; Inhale 2 puffs into the lungs every 6 (six) hours as  needed for wheezing or shortness of breath.  Dispense: 1 each; Refill: 1       Upper Respiratory Infection (URI) with Bronchitis They present with a week of chest congestion, coughing, fatigue, and yellow sputum but no fever. Wheezing was noted on auscultation, with no history of asthma, emphysema, or smoking, though they recently experienced a heat stroke and have sinus congestion. Their COVID test is negative. We will order a chest X-ray to exclude pneumonia and prescribe Z-Pak (Azithromycin) for the suspected bacterial infection. Prednisone will address lung inflammation. Considering affordability and accessibility, Albuterol and advanced inhalers may be used. Follow-up is advised only if symptoms persist.            Clinical Presentation:    77 y.o. male who has  GERD (gastroesophageal reflux disease); Hypertension; Allergies; Back pain at L4-L5 level; Displacement of lumbar intervertebral disc without myelopathy; Dysphagia; Dizziness; Hypertensive retinopathy; Lower urinary tract symptoms due to benign prostatic hyperplasia; Mixed hyperlipidemia; Stricture of esophagus; Chronic bronchitis (HCC); H/O esophagectomy; Mechanical complication of esophagostomy (HCC); Insomnia; PSA elevation; BPH (benign prostatic hyperplasia); Restless legs syndrome; Hyperlipidemia, acquired; Peripheral polyneuropathy; and Hiatal hernia on their problem list. His reasons/main concerns/chief complaints for today's office visit are Possible bronchitis, Severe chest congestion (Has been doing breathing treatments, inhaler, Coricidin flu and mucus relief OTC with no relief this time.), Productive cough (Intentionally coughing to produce mucus (yellow). All symptoms for about a week. No fever.), and Fatigue   AI-Extracted: Discussed the use of AI scribe software for clinical note transcription with the patient, who gave verbal consent to proceed.  History of Present Illness   The patient, with no known history of recurrent bronchitis due to aspiration, managed as chronic bronchitis, presents with a week-long history of deep chest congestion and coughing, producing thick yellow mucus. The patient denies any fever, body aches, or shortness of breath. Despite using breathing treatments and an inhaler, the patient reports no relief. The patient also reports significant fatigue, stating they tire easily with minimal activity.  The patient denies any history of smoking or lung injury. However, they report a recent episode of heat stroke approximately 10 days prior to the onset of the current symptoms. The patient also reports mild to moderate sinus congestion but denies any sore throat or ear discomfort.  The patient's symptoms have been unresponsive to over-the-counter flu and mucus relief  medications, which usually provide relief. The patient's fatigue is described as severe, limiting their ability to perform daily activities.  The patient also mentions a recent course of methylprednisolone prescribed by their neurologist for a severe nerve issue in the right leg. The patient reports that the medication was effective in resolving the nerve issue and also helped with their neuropathy. The patient has been using a nebulizer and an albuterol inhaler for symptom management.  The patient expresses concern about the long-term effects of prednisone use, particularly its potential decreased effectiveness over time. The patient is also nearing the end of their current supply of albuterol inhaler.        He  has a past medical history of Allergy, Arthritis, Barrett's esophagus, Barrett's esophagus with dysplasia (07/24/2021), BPH (benign prostatic hyperplasia), Chronic lower back pain, Chronic pain (07/24/2021), Chronic rhinitis, Chronic sinusitis (11/11/2018), ED (erectile dysfunction) of organic origin, GERD (gastroesophageal reflux disease), Hemoptysis (11/11/2018), History of wheezing (11/11/2018), Hypertension (10/20/21), Neuromuscular disorder (HCC) (2011), Neuropathy (06/18/2016), Pneumonitis due to inhalation of food or vomitus (HCC) (07/30/2022), Thyroid disease, Tinnitus, and Urinary tract infectious disease (08/13/2022). Current  Outpatient Medications on File Prior to Visit  Medication Sig   acetaminophen (TYLENOL) 500 MG tablet Take by mouth as needed.   alfuzosin (UROXATRAL) 10 MG 24 hr tablet Take 10 mg by mouth as needed.   Famotidine-Ca Carb-Mag Hydrox (PEPCID COMPLETE PO) Take by mouth daily.   HYDROcodone-acetaminophen (NORCO/VICODIN) 5-325 MG tablet Take 1 tablet by mouth every 6 (six) hours as needed for moderate pain or severe pain (do not drive while taking, do not mix with other sedatives such as etoh.).   ibuprofen (ADVIL) 200 MG tablet Take 200 mg by mouth 4 (four) times  daily.   levalbuterol (XOPENEX) 1.25 MG/3ML nebulizer solution Take 1.25 mg by nebulization every 4 (four) hours as needed for wheezing.   losartan (COZAAR) 50 MG tablet Take 50 mg by mouth daily as needed (BP>120).   montelukast (SINGULAIR) 10 MG tablet Take 10 mg by mouth as needed.   omeprazole (PRILOSEC) 20 MG capsule Take by mouth daily.   pregabalin (LYRICA) 100 MG capsule Take 1 capsule (100 mg total) by mouth 2 (two) times daily.   Saw Palmetto, Serenoa repens, (SAW PALMETTO PO) Take 1 capsule by mouth daily. 450 MG   sildenafil (VIAGRA) 100 MG tablet    No current facility-administered medications on file prior to visit.   Medications Discontinued During This Encounter  Medication Reason   sildenafil (REVATIO) 20 MG tablet    Homeopathic Products (RESTFUL LEGS PM) SUBL    OVER THE COUNTER MEDICATION    albuterol (VENTOLIN HFA) 108 (90 Base) MCG/ACT inhaler Reorder         Clinical Data Analysis:   Physical Exam  BP (!) 179/82 (BP Location: Left Arm, Patient Position: Sitting)   Pulse 64   Temp 98.2 F (36.8 C) (Temporal)   Ht 5\' 6"  (1.676 m)   Wt 173 lb (78.5 kg)   SpO2 98%   BMI 27.92 kg/m  Wt Readings from Last 10 Encounters:  01/18/23 173 lb (78.5 kg)  10/09/22 168 lb (76.2 kg)  09/11/22 168 lb 12.8 oz (76.6 kg)  08/22/22 168 lb 3.2 oz (76.3 kg)  08/13/22 169 lb (76.7 kg)  08/02/22 175 lb 6.4 oz (79.6 kg)  07/30/22 172 lb (78 kg)  12/04/21 163 lb 12.8 oz (74.3 kg)  11/11/18 166 lb 6.4 oz (75.5 kg)  02/03/15 165 lb (74.8 kg)   Vital signs reviewed.  Nursing notes reviewed. Weight trend reviewed. Abnormalities and Problem-Specific physical exam findings:   Physical Exam   VITALS: Heart rate ttransiently elevated, Blood pressure initially elevated HEENT: Oropharynx without erythema or exudate, Sinuses mildly congested\  General Appearance:  No acute distress appreciable.   Well-groomed, healthy-appearing male.  Well proportioned with no abnormal fat  distribution.  Good muscle tone. Skin: Clear and well-hydrated. Pulmonary:  Normal work of breathing at rest, no respiratory distress apparent. SpO2: 98 %  Wheezes present in lungs  Musculoskeletal: All extremities are intact.  Neurological:  Awake, alert, oriented, and engaged.  No obvious focal neurological deficits or cognitive impairments.  Sensorium seems unclouded.   Speech is clear and coherent with logical content. Psychiatric:  Appropriate mood, pleasant and cooperative demeanor, thoughtful and engaged during the exam  Results Reviewed:      Results for orders placed or performed in visit on 01/18/23  POC COVID-19  Result Value Ref Range   SARS Coronavirus 2 Ag Negative Negative     Result Date: 08/14/2022 CLINICAL DATA:  Cough and wheezing. EXAM: CHEST - 2 VIEW  COMPARISON:  Chest x-ray 05/31/2022. FINDINGS: The heart size and mediastinal contours are within normal limits. Both lungs are clear. Large hiatal hernia again noted with surgical clips in the mediastinum. The visualized skeletal structures are unremarkable. IMPRESSION: No active cardiopulmonary disease. Electronically Signed   By: Darliss Cheney M.D.   On: 08/14/2022 22:52      This encounter employed real-time, collaborative documentation. The patient actively reviewed and updated their medical record on a shared screen, ensuring transparency and facilitating joint problem-solving for the problem list, overview, and plan. This approach promotes accurate, informed care. The treatment plan was discussed and reviewed in detail, including medication safety, potential side effects, and all patient questions. We confirmed understanding and comfort with the plan. Follow-up instructions were established, including contacting the office for any concerns, returning if symptoms worsen, persist, or new symptoms develop, and precautions for potential emergency department visits. ----------------------------------------------------- [name], MD  01/18/2023 6:45 PM  [facility]:  [phone]

## 2023-01-18 NOTE — Patient Instructions (Signed)
It was a pleasure seeing you today! Your health and satisfaction are our top priorities.  Jordan Hew, MD  Your Providers PCP: Jordan Olszewski, MD,  [phone]) Referring Provider: [name], MD,  [phone]) Care Team Provider: [name], MD,  [phone]) Care Team Provider: [name], MD,  [phone]) Care Team Provider: [name], MD,  [phone]) Care Team Provider: [name], MD,  [phone])  VISIT SUMMARY:  During your visit, we discussed your ongoing chest congestion, coughing, and fatigue. You've been producing yellow mucus and your symptoms have not improved with over-the-counter medications or your inhaler. You also mentioned a recent episode of heat stroke and a course of methylprednisolone for a nerve issue in your leg. We suspect you may have an upper respiratory infection with bronchitis.  YOUR PLAN:  -UPPER RESPIRATORY INFECTION WITH BRONCHITIS: This is a type of chest infection that affects your airways, causing symptoms like coughing and chest congestion. We will order a chest X-ray to rule out pneumonia, a more serious lung infection. We're prescribing Z-Pak (Azithromycin), an antibiotic, to treat the suspected bacterial infection. We're also prescribing Prednisone to reduce inflammation in your lungs. If affordable and accessible for you, we may consider advanced inhalers in addition to your current Albuterol inhaler.  INSTRUCTIONS:  Please start taking the prescribed medications as directed. If your symptoms persist or worsen, please schedule a follow-up appointment. Also, please complete the chest X-ray as soon as possible to help [place] confirm your diagnosis and adjust your treatment plan if necessary.   NEXT STEPS: [x]  Early Intervention: Schedule sooner appointment, call our on-call services, or go to emergency room if there is any significant Increase in pain or discomfort New or worsening symptoms Sudden or  severe changes in your health [x]  Flexible Follow-Up: We recommend a Return in about 3 months (around 04/20/2023) for chronic disease monitoring and management. for optimal routine care. This allows for progress monitoring and treatment adjustments. [x]  Preventive Care: Schedule your annual preventive care visit! It's typically covered by insurance and helps identify potential health issues early. [x]  Lab & X-ray Appointments: Incomplete tests scheduled today, or call to schedule. X-rays: [facility] (M-F, 8:30am-noon or 1pm-5pm). [x]  Medical Information Release: Sign a release form at front desk to obtain relevant medical information we don't have.  MAKING THE MOST OF OUR FOCUSED 20 MINUTE APPOINTMENTS: [x]   Clearly state your top concerns at the beginning of the visit to focus our discussion [x]   If you anticipate you will need more time, please inform the front desk during scheduling - we can book multiple appointments in the same week. [x]   If you have transportation problems- use our convenient video appointments or ask about transportation support. [x]   We can get down to business faster if you use MyChart to update information before the visit and submit non-urgent questions before your visit. Thank you for taking the time to provide details through MyChart.  Let our nurse know and she can import this information into your encounter documents.  Arrival and Wait Times: [x]   Arriving on time ensures that everyone receives prompt attention. [x]   Early morning (8a) and afternoon (1p) appointments tend to have shortest wait times. [x]   Unfortunately, we cannot delay appointments for late arrivals or hold slots during phone calls.  Getting Answers and Following Up [x]   Simple Questions & Concerns: For quick questions or basic follow-up after your visit, reach [place] at [phone] or MyChart messaging. [x]   Complex Concerns: If your concern is more complex, scheduling an  appointment might be best. Discuss this with the staff to find the most suitable option. [x]   Lab & Imaging Results: We'll contact you directly if results are abnormal or you don't use MyChart. Most normal results will be on MyChart within 2-3 business days, with a review message from Dr. Jon Billings. Haven't heard back in 2 weeks? Need results sooner? Contact us at [phone]. [x]   Referrals: Our referral coordinator will manage specialist referrals. The specialist's office should contact you within 2 weeks to schedule an appointment. Call us if you haven't heard from them after 2 weeks.  Staying Connected [x]   MyChart: Activate your MyChart for the fastest way to access results and message [place]. See the last page of this paperwork for instructions on how to activate.  Bring to Your Next Appointment [x]   Medications: Please bring all your medication bottles to your next appointment to ensure we have an accurate record of your prescriptions. [x]   Health Diaries: If you're monitoring any health conditions at home, keeping a diary of your readings can be very helpful for discussions at your next appointment.  Billing [x]   X-ray & Lab Orders: These are billed by separate companies. Contact the invoicing company directly for questions or concerns. [x]   Visit Charges: Discuss any billing inquiries with our administrative services team.  Your Satisfaction Matters [x]   Share Your Experience: We strive for your satisfaction! If you have any complaints, or preferably compliments, please let Dr. Jon Billings know directly or contact our Practice Administrators, Edwena Felty or [employer], by asking at the front desk.   Reviewing Your Records [x]   Review this early draft of your clinical encounter notes below and the final encounter summary tomorrow on MyChart after its been completed.  All orders placed so far are visible here: Cough, unspecified type -     POC COVID-19 BinaxNow -     Azithromycin; Take  2 tablets (500 mg) PO today, then 1 tablet (250 mg) PO daily x4 days.  Dispense: 6 tablet; Refill: 0 -     DG Chest 2 View; Future -     predniSONE; Take 2 pills for 3 days, 1 pill for 4 days  Dispense: 10 tablet; Refill: 0 -     Breztri Aerosphere; Inhale 2 puffs into the lungs in the morning and at bedtime.  Dispense: 1 each; Refill: 1 -     Albuterol Sulfate HFA; Inhale 2 puffs into the lungs every 6 (six) hours as needed for wheezing or shortness of breath.  Dispense: 1 each; Refill: 1  Other fatigue -     POC COVID-19 BinaxNow  Mixed simple and mucopurulent chronic bronchitis (HCC) -     Azithromycin; Take 2 tablets (500 mg) PO today, then 1 tablet (250 mg) PO daily x4 days.  Dispense: 6 tablet; Refill: 0 -     DG Chest 2 View; Future -     predniSONE; Take 2 pills for 3 days, 1 pill for 4 days  Dispense: 10 tablet; Refill: 0 -     Breztri Aerosphere; Inhale 2 puffs into the lungs in the morning and at bedtime.  Dispense: 1 each; Refill: 1 -     Albuterol Sulfate HFA; Inhale 2 puffs into the lungs every 6 (six) hours as needed for wheezing or shortness of breath.  Dispense: 1 each; Refill: 1  COPD exacerbation (HCC) -     Azithromycin; Take 2 tablets (500 mg)  PO today, then 1 tablet (250 mg) PO daily x4 days.  Dispense: 6 tablet; Refill: 0 -     DG Chest 2 View; Future -     predniSONE; Take 2 pills for 3 days, 1 pill for 4 days  Dispense: 10 tablet; Refill: 0 -     Breztri Aerosphere; Inhale 2 puffs into the lungs in the morning and at bedtime.  Dispense: 1 each; Refill: 1 -     Albuterol Sulfate HFA; Inhale 2 puffs into the lungs every 6 (six) hours as needed for wheezing or shortness of breath.  Dispense: 1 each; Refill: 1

## 2023-01-24 ENCOUNTER — Encounter: Payer: Self-pay | Admitting: Internal Medicine

## 2023-01-25 ENCOUNTER — Ambulatory Visit (INDEPENDENT_AMBULATORY_CARE_PROVIDER_SITE_OTHER)
Admission: RE | Admit: 2023-01-25 | Discharge: 2023-01-25 | Disposition: A | Discharge status: Home / Self Care | Payer: Medicare Other | Source: Ambulatory Visit | Attending: Internal Medicine | Admitting: Internal Medicine

## 2023-01-25 DIAGNOSIS — J418 Mixed simple and mucopurulent chronic bronchitis: Secondary | ICD-10-CM

## 2023-01-25 DIAGNOSIS — R058 Other specified cough: Secondary | ICD-10-CM | POA: Diagnosis not present

## 2023-01-25 DIAGNOSIS — K449 Diaphragmatic hernia without obstruction or gangrene: Secondary | ICD-10-CM | POA: Diagnosis not present

## 2023-01-25 DIAGNOSIS — J441 Chronic obstructive pulmonary disease with (acute) exacerbation: Secondary | ICD-10-CM

## 2023-01-25 DIAGNOSIS — R059 Cough, unspecified: Secondary | ICD-10-CM | POA: Diagnosis not present

## 2023-01-25 DIAGNOSIS — J929 Pleural plaque without asbestos: Secondary | ICD-10-CM | POA: Diagnosis not present

## 2023-01-25 NOTE — Telephone Encounter (Signed)
Patient had Xray done today 01/25/2023

## 2023-01-28 ENCOUNTER — Ambulatory Visit: Payer: Medicare Other | Admitting: Internal Medicine

## 2023-01-28 ENCOUNTER — Encounter: Payer: Self-pay | Admitting: Internal Medicine

## 2023-01-28 VITALS — BP 200/98 | HR 90 | Temp 98.2°F | Ht 66.0 in | Wt 169.8 lb

## 2023-01-28 DIAGNOSIS — J411 Mucopurulent chronic bronchitis: Secondary | ICD-10-CM

## 2023-01-28 DIAGNOSIS — J209 Acute bronchitis, unspecified: Secondary | ICD-10-CM

## 2023-01-28 DIAGNOSIS — I10 Essential (primary) hypertension: Secondary | ICD-10-CM

## 2023-01-28 DIAGNOSIS — J441 Chronic obstructive pulmonary disease with (acute) exacerbation: Secondary | ICD-10-CM | POA: Diagnosis not present

## 2023-01-28 MED ORDER — CHLORTHALIDONE 25 MG PO TABS: 25.0000 mg | ORAL_TABLET | Freq: Every day | ORAL | 0 refills | Status: DC

## 2023-01-28 MED ORDER — AMOXICILLIN-POT CLAVULANATE 875-125 MG PO TABS
1.0000 | ORAL_TABLET | Freq: Two times a day (BID) | ORAL | 0 refills | Status: DC
Start: 2023-01-28 — End: 2023-02-19

## 2023-01-28 NOTE — Patient Instructions (Addendum)
VISIT SUMMARY:  During your visit, we discussed your severe respiratory symptoms, high blood pressure, and general weakness. Despite a recent course of medication, your symptoms have not improved, which is concerning. We also discussed your history of esophagectomy due to Barrett's esophagus. You reported a good appetite and no issues with vision, and your condition seems to improve with rest.  YOUR PLAN:  -SEVERE HYPERTENSION: Your blood pressure is very high, which can be dangerous. We will start you on a medication called Chlorthalidone to help lower your blood pressure. We will monitor your blood pressure, heart rate, and oxygen levels every 3 hours. If your blood pressure continues to rise or you develop other concerning symptoms, you should go to the emergency room.  Also, we want you to resume your losartan from home.  -ACUTE EXACERBATION OF CHRONIC BRONCHITIS: Your persistent cough and wheezing are likely due to bronchitis, an inflammation of the bronchial tubes in your lungs. We will start you on a medication called Augmentin and continue to monitor your symptoms closely.  We suspect this bronchitis is from aspiration leading to infection and inflammation of the large air tubes that lead to your lungs.  -SHORTNESS OF BREATH WITH MINIMAL EXERTION: Your shortness of breath with minimal exertion is likely due to fluid backup in the lungs from high blood pressure. The medication Chlorthalidone should also help with this issue. Hopefully the antibiotic(s) as well.  If this worsens you must seek emergency room.  Our early evaluation of CXR didn't show a reason, but we have not ruled out blood clot.  -HISTORY OF ESOPHAGECTOMY: We did not identify any current issues related to your past esophagectomy, except that it contributes to recurring bronchitis and we don't know how to prevent that.  INSTRUCTIONS:  We will revisit the decision to go to the emergency room based on your response to treatment  tomorrow. If your condition worsens or does not improve with the current treatment plan, we will consider further evaluation, such as a CT scan of the lungs.    Provided the following artificial intelligence-generated handout and made extensive efforts to gain agreement to emergency room, but he did not want to go:  Given the patient's complex medical history and current symptoms, there is a strong recommendation for him to go to the ER. Here's why:  1. Uncontrolled hypertension: The patient's blood pressure has significantly increased over the past 10 days, reaching 200/100, which is considered a hypertensive urgency. This alone warrants immediate medical attention.  2. Respiratory symptoms: Despite the relatively clear chest X-ray, the patient's severe productive cough, wheezing, and dyspnea on exertion for two weeks are concerning, especially given his history of esophageal removal and recurrent pneumonia.  3. Failed outpatient treatment: The lack of response to azithromycin and prednisone suggests that the current condition may be more serious or resistant to standard treatments.  4. Weakness: The patient reports feeling weaker, which could be a sign of a more systemic issue.  5. Complex medical history: His history of esophageal removal and frequent pneumonia due to microaspirations makes him more vulnerable to severe respiratory complications.  However, if the patient insists on outpatient management, here's a list of critical factors to monitor and reasons to go to the ER immediately:  1. Blood pressure readings above 180/120 mmHg or symptoms like severe headache, chest pain, or shortness of breath. 2. Fever development (temperature above 100.39F or 38C). 3. Worsening respiratory symptoms, especially increased difficulty breathing or chest pain. 4. Any signs of confusion,  dizziness, or altered mental status. 5. Coughing up blood or rusty-colored sputum. 6. Severe fatigue or inability  to perform daily activities. 7. Swelling in the legs or ankles (which could indicate heart failure). 8. Any new neurological symptoms like blurred vision, severe headache, or difficulty speaking. 9. Persistent vomiting or inability to keep fluids down. 10. Skin color changes, particularly bluish lips or fingers (cyanosis).  It's crucial to emphasize that given the patient's complex medical history and current symptoms, outpatient management carries significant risks. Close monitoring and immediate action if any of the above symptoms occur is absolutely essential. The safest course of action remains seeking immediate medical attention at an ER for proper evaluation and management of both the respiratory issues and the hypertensive urgency.

## 2023-01-28 NOTE — Progress Notes (Signed)
[facility]: [phone]   Routine Medical Office Visit  Patient:  Jordan [name]      Age: 77 y.o.       Sex:  male  Date:   01/28/2023 Patient Care Team: [name], MD as PCP - General (Internal Medicine) [name], MD as PCP - Cardiology (Cardiology) [name], MD as Consulting Physician (Gastroenterology) [name], MD as Consulting Physician (Pulmonary Disease) [name], MD as Consulting Physician (Neurosurgery) Today's Healthcare Provider: [name], MD   Assessment and Plan:      Problem List Items Addressed This Visit     Chronic bronchitis [facility])    Reviewed Dr. Humphrey Rolls plan but its not helpful as prednisone and Z-pack failed. Therefore advised patient to see emergency room,     Provided the following artificial intelligence-generated handout and made extensive efforts to gain agreement to emergency room, but he did not want to go:  Given the patient's complex medical history and current symptoms, there is a strong recommendation for him to go to the ER. Here's why:  1. Uncontrolled hypertension: The patient's blood pressure has significantly increased over the past 10 days, reaching 200/100, which is considered a hypertensive urgency. This alone warrants immediate medical attention.  2. Respiratory symptoms: Despite the relatively clear chest X-ray, the patient's severe productive cough, wheezing, and dyspnea on exertion for two weeks are concerning, especially given his history of esophageal removal and recurrent pneumonia.  3. Failed outpatient treatment: The lack of response to azithromycin and prednisone suggests that the current condition may be more serious or resistant to standard treatments.  4. Weakness: The patient reports feeling weaker, which could be a sign of a more systemic issue.  5. Complex medical history: His history of esophageal removal and frequent pneumonia due to microaspirations  makes him more vulnerable to severe respiratory complications.  However, if the patient insists on outpatient management, here's a list of critical factors to monitor and reasons to go to the ER immediately:  1. Blood pressure readings above 180/120 mmHg or symptoms like severe headache, chest pain, or shortness of breath. 2. Fever development (temperature above 100.35F or 38C). 3. Worsening respiratory symptoms, especially increased difficulty breathing or chest pain. 4. Any signs of confusion, dizziness, or altered mental status. 5. Coughing up blood or rusty-colored sputum. 6. Severe fatigue or inability to perform daily activities. 7. Swelling in the legs or ankles (which could indicate heart failure). 8. Any new neurological symptoms like blurred vision, severe headache, or difficulty speaking. 9. Persistent vomiting or inability to keep fluids down. 10. Skin color changes, particularly bluish lips or fingers (cyanosis).  It's crucial to emphasize that given the patient's complex medical history and current symptoms, outpatient management carries significant risks. Close monitoring and immediate action if any of the above symptoms occur is absolutely essential. The safest course of action remains seeking immediate medical attention at an ER for proper evaluation and management of both the respiratory issues and the hypertensive urgency.   He still didn't want to go to emergency room so agreed to see back in office in 2 days, and try Augmentin and blood pressure medication(s) chlorthalidone. In my medical opinion this is unlikely to be sufficient, but he and his wife were present and assured me he would go to emergency room if conditions worsen or don't improve.      Relevant Medications   amoxicillin-clavulanate (AUGMENTIN) 875-125 MG tablet   Hypertension    Blood  pressure is significantly elevated at 200/98, indicating potential hypertensive urgency/emergency without a clear cause. We  will start Chlorthalidone for blood pressure control and diuresis. He is to resume losartan which he stopped due to feeling badly. Blood pressure, heart rate, and oxygen saturation will be monitored every 3 hours. Should blood pressure continue to rise or other concerning symptoms develop, such as chest pain, blurry vision, headache, difficulty speaking, persistent vomiting, skin color changes, or severe fatigue, he should proceed to the emergency room.      Relevant Medications   chlorthalidone (HYGROTON) 25 MG tablet   Other Visit Diagnoses     Acute exacerbation of chronic bronchitis (HCC)    -  Primary   Relevant Medications   amoxicillin-clavulanate (AUGMENTIN) 875-125 MG tablet   COPD exacerbation (HCC)       Relevant Medications   amoxicillin-clavulanate (AUGMENTIN) 875-125 MG tablet          General Health Maintenance / Followup Plans The decision to go to the emergency room will be revisited based on his response to treatment tomorrow. A further evaluation, such as a CT scan of the lungs, will be considered if his condition worsens or does not improve with the current treatment plan.   Recommended follow up: 2 days (or emergency room sooner is actual recommend) Future Appointments  Date Time Provider Department Center  01/30/2023  1:00 PM [name], MD [facility]  10/15/2023 10:00 AM [facility] ANNUAL WELLNESS VISIT 1 [facility]           Clinical Presentation:    76 y.o. male who has GERD (gastroesophageal reflux disease); Hypertension; Allergies; Back pain at L4-L5 level; Displacement of lumbar intervertebral disc without myelopathy; Dysphagia; Dizziness; Hypertensive retinopathy; Lower urinary tract symptoms due to benign prostatic hyperplasia; Mixed hyperlipidemia; Stricture of esophagus; Chronic bronchitis (HCC); H/O esophagectomy; Mechanical complication of esophagostomy (HCC); Insomnia; PSA elevation; BPH (benign prostatic hyperplasia); Restless legs syndrome;  Hyperlipidemia, acquired; Peripheral polyneuropathy; and Hiatal hernia on their problem list. His reasons/main concerns/chief complaints for today's office visit are Bronchitis symptoms worsened, Productive cough, and Rib cage pain   AI-Extracted: Discussed the use of AI scribe software for clinical note transcription with the patient, who gave verbal consent to proceed.  History of Present Illness   The patient, with a complex medical history including esophagectomy due to Barrett's esophagus, presented with severe respiratory symptoms, including persistent coughing and production of phlegm. The patient reported that these symptoms have been worsening, leading to soreness in the ribcage. Despite a recent course of prednisone and erythromycin, there was no improvement in symptoms, leading to increased concern.  The patient also reported a significant increase in blood pressure, with readings exceeding 200. This was a new development, as previous readings ranged from 140 to 170. The patient reported no recent use of prescribed losartan, a medication for hypertension.  In addition to respiratory symptoms and hypertension, the patient reported general weakness and shortness of breath with minimal exertion. However, there were no reports of fever, confusion, dizziness, altered mental status, coughing up blood, severe fatigue, inability to perform daily activities, swelling in the legs or ankles, new neurological symptoms, persistent vomiting, inability to keep fluids down, or skin color changes.  The patient's history includes a significant surgical intervention, an esophagectomy performed 20 years ago due to high-grade dysplasia from Barrett's esophagus. The patient reported occasional reflux since the surgery, which has been managed with lifestyle modifications, including sleeping in an upright position.  Despite these significant symptoms  and medical history, the patient reported a good appetite and no  issues with vision. The patient's caregiver noted that the patient's condition seemed to improve with rest and worsened with exertion or after eating. The patient expressed a preference for managing symptoms at home, with close monitoring of vitals and adherence to prescribed medication.      He  has a past medical history of Allergy, Arthritis, Barrett's esophagus, Barrett's esophagus with dysplasia (07/24/2021), BPH (benign prostatic hyperplasia), Chronic lower back pain, Chronic pain (07/24/2021), Chronic rhinitis, Chronic sinusitis (11/11/2018), ED (erectile dysfunction) of organic origin, GERD (gastroesophageal reflux disease), Hemoptysis (11/11/2018), History of wheezing (11/11/2018), Hypertension (10/20/21), Neuromuscular disorder (HCC) (2011), Neuropathy (06/18/2016), Pneumonitis due to inhalation of food or vomitus (HCC) (07/30/2022), Thyroid disease, Tinnitus, and Urinary tract infectious disease (08/13/2022).  Problem overviews that were updated today: Problem  Hypertension   Current hypertension medications:       Sig   losartan (COZAAR) 50 MG tablet (Taking) Take 50 mg by mouth daily as needed (BP>120).   sildenafil (REVATIO) 20 MG tablet (Taking) Take 20 mg by mouth daily as needed (hypertension).      Home readings: checks daily, knows goal 140/90 BP Readings from Last 3 Encounters:  09/11/22 110/60  08/22/22 118/70  08/13/22 (!) 142/88        Chronic Bronchitis (Hcc)   Recurrent due to aspirations from esophagectomy Per pulmonary Dr. Celine Mans:  has symptoms of reflux and lower micro-aspiration occasionally causing respiratory episodes.  We discussed disease management and progression at length today regarding reflux management in the setting of his esophagectomy. Most of his treatment at this point is lifestyle modification given his altered upper GI anatomy. We discussed avoiding trigger foods, sleeping up right with wedge pillow or bed risers.   If future episodes would  probably just treat with prednisone for inflammation as these are not likely to require antibiotics     Current Outpatient Medications on File Prior to Visit  Medication Sig   acetaminophen (TYLENOL) 500 MG tablet Take by mouth as needed.   albuterol (VENTOLIN HFA) 108 (90 Base) MCG/ACT inhaler Inhale 2 puffs into the lungs every 6 (six) hours as needed for wheezing or shortness of breath.   Albuterol-Budesonide (AIRSUPRA) 90-80 MCG/ACT AERO INHALE 2 INTO THE LUNGS EVERY 4 HOURS AS NEEDED. REPLACES ALBUTEROL, RESCUE INHALER   alfuzosin (UROXATRAL) 10 MG 24 hr tablet Take 10 mg by mouth as needed.   Budeson-Glycopyrrol-Formoterol (BREZTRI AEROSPHERE) 160-9-4.8 MCG/ACT AERO Inhale 2 puffs into the lungs in the morning and at bedtime.   Famotidine-Ca Carb-Mag Hydrox (PEPCID COMPLETE PO) Take by mouth daily.   HYDROcodone-acetaminophen (NORCO/VICODIN) 5-325 MG tablet Take 1 tablet by mouth every 6 (six) hours as needed for moderate pain or severe pain (do not drive while taking, do not mix with other sedatives such as etoh.).   ibuprofen (ADVIL) 200 MG tablet Take 200 mg by mouth 4 (four) times daily.   levalbuterol (XOPENEX) 1.25 MG/3ML nebulizer solution Take 1.25 mg by nebulization every 4 (four) hours as needed for wheezing.   losartan (COZAAR) 50 MG tablet Take 50 mg by mouth daily as needed (BP>120).   montelukast (SINGULAIR) 10 MG tablet Take 10 mg by mouth as needed.   omeprazole (PRILOSEC) 20 MG capsule Take by mouth daily.   pregabalin (LYRICA) 100 MG capsule Take 1 capsule (100 mg total) by mouth 2 (two) times daily.   Saw Palmetto, Serenoa repens, (SAW PALMETTO PO) Take 1 capsule by mouth daily. 450  MG   sildenafil (VIAGRA) 100 MG tablet    azithromycin (ZITHROMAX Z-PAK) 250 MG tablet Take 2 tablets (500 mg) PO today, then 1 tablet (250 mg) PO daily x4 days. (Patient not taking: Reported on 01/28/2023)   predniSONE (DELTASONE) 20 MG tablet Take 2 pills for 3 days, 1 pill for 4 days  (Patient not taking: Reported on 01/28/2023)   No current facility-administered medications on file prior to visit.   There are no discontinued medications.  Attestation:  I have personally spent  48 minutes involved in face-to-face and non-face-to-face activities for this patient on the day of the visit. Professional time spent includes the following activities:  Preparing to see the patient by reviewing medical records prior to and during the encounter; Obtaining, documenting, and reviewing an updated medical history; Performing a medically appropriate examination;  Evaluating, synthesizing, and documenting the available clinical information in the EMR;  reviewing opinions of other health care professionals; Independently interpreting and showing patient his XR results (not separately reported), Communicating, counseling, educating about results to the patient/family/ (not separately reported); Collaboratively developing and communicating an individualized treatment plan with the patient, negotiating his desire to avoid emergency room evaluation ; Placing medically necessary orders (for medications) arranging close follow up.;   This time was independent of any separately billable procedure(s).  The extended duration of this patient visit was medically necessary due to several factors:  The patient's health condition is multifaceted, requiring a comprehensive evaluation of patient and their past records to ensure accurate diagnosis and treatment planning; Effective patient education and communication, particularly for patients with complex care needs, often require additional time to ensure the patient (or caregivers) fully understand the care plan;  His condition has not improved and remains severe despite prior efforts at treatment and diagnosis.  All these factors are integral to providing high-quality patient care and ensuring optimal health outcomes.         Clinical Data Analysis:   Physical Exam   BP (!) 200/98 (BP Location: Left Arm, Patient Position: Sitting)   Pulse 90   Temp 98.2 F (36.8 C) (Temporal)   Ht 5\' 6"  (1.676 m)   Wt 169 lb 12.8 oz (77 kg)   SpO2 97%   BMI 27.41 kg/m  Wt Readings from Last 10 Encounters:  01/28/23 169 lb 12.8 oz (77 kg)  01/18/23 173 lb (78.5 kg)  10/09/22 168 lb (76.2 kg)  09/11/22 168 lb 12.8 oz (76.6 kg)  08/22/22 168 lb 3.2 oz (76.3 kg)  08/13/22 169 lb (76.7 kg)  08/02/22 175 lb 6.4 oz (79.6 kg)  07/30/22 172 lb (78 kg)  12/04/21 163 lb 12.8 oz (74.3 kg)  11/11/18 166 lb 6.4 oz (75.5 kg)   Vital signs reviewed.  Nursing notes reviewed. Weight trend reviewed. Abnormalities and Problem-Specific physical exam findings:  Physical Exam   VITALS: BP- 200/98, SaO2- 97 CHEST: Lungs clear, congestion suggestive of bronchitis, wheezing heard bilaterally, no localized wheezes indicating generalized airway involvement. CARDIOVASCULAR: Heart sounds normal.      General Appearance:  No acute distress appreciable.   Well-groomed, healthy-appearing male.  Well proportioned with no abnormal fat distribution.  Good muscle tone. Skin: Clear and well-hydrated. Pulmonary:  Normal work of breathing at rest, no respiratory distress apparent. SpO2: 97 %  Musculoskeletal: All extremities are intact.  Neurological:  Awake, alert, oriented, and engaged.  No obvious focal neurological deficits or cognitive impairments.  Sensorium seems unclouded.   Speech is clear and coherent with logical  content. Psychiatric:  Appropriate mood, pleasant and cooperative demeanor, thoughtful and engaged during the exam  Results Reviewed:      RADIOLOGY 01/25/23 Chest X-ray: No pneumonia, lungs clear, hyperinflated lungs suggestive of emphysema, areas of congestion indicative of bronchitis. This is my personal interpretation, no official radiologist interpretation yet available.        No results found for any visits on 01/28/23.  Office Visit on 01/18/2023  Component  Date Value   SARS Coronavirus 2 Ag 01/18/2023 Negative   Lab on 08/02/2022  Component Date Value   Magnesium 08/02/2022 1.9    Cholesterol 08/02/2022 129    Triglycerides 08/02/2022 77.0    HDL 08/02/2022 48.30    VLDL 08/02/2022 15.4    LDL Cholesterol 08/02/2022 65    Total CHOL/HDL Ratio 08/02/2022 3    NonHDL 08/02/2022 80.50    Sodium 08/02/2022 142    Potassium 08/02/2022 4.9    Chloride 08/02/2022 107    CO2 08/02/2022 27    Glucose, Bld 08/02/2022 113 (H)    BUN 08/02/2022 26 (H)    Creatinine, Ser 08/02/2022 1.04    Total Bilirubin 08/02/2022 1.0    Alkaline Phosphatase 08/02/2022 56    AST 08/02/2022 15    ALT 08/02/2022 12    Total Protein 08/02/2022 6.3    Albumin 08/02/2022 4.0    GFR 08/02/2022 69.91    Calcium 08/02/2022 9.4    WBC 08/02/2022 8.4    RBC 08/02/2022 4.40    Hemoglobin 08/02/2022 12.0 (L)    HCT 08/02/2022 37.2 (L)    MCV 08/02/2022 84.5    MCHC 08/02/2022 32.2    RDW 08/02/2022 17.5 (H)    Platelets 08/02/2022 220.0    Neutrophils Relative % 08/02/2022 64.0    Lymphocytes Relative 08/02/2022 22.1    Monocytes Relative 08/02/2022 8.0    Eosinophils Relative 08/02/2022 5.1 (H)    Basophils Relative 08/02/2022 0.8    Neutro Abs 08/02/2022 5.3    Lymphs Abs 08/02/2022 1.8    Monocytes Absolute 08/02/2022 0.7    Eosinophils Absolute 08/02/2022 0.4    Basophils Absolute 08/02/2022 0.1    Vitamin B-12 08/02/2022 446    Folate 08/02/2022 11.2   Admission on 02/15/2022, Discharged on 02/16/2022  Component Date Value   Sodium 02/15/2022 141    Potassium 02/15/2022 5.2 (H)    Chloride 02/15/2022 108    CO2 02/15/2022 28    Glucose, Bld 02/15/2022 118 (H)    BUN 02/15/2022 24 (H)    Creatinine, Ser 02/15/2022 1.01    Calcium 02/15/2022 10.2    Total Protein 02/15/2022 6.8    Albumin 02/15/2022 4.2    AST 02/15/2022 21    ALT 02/15/2022 21    Alkaline Phosphatase 02/15/2022 50    Total Bilirubin 02/15/2022 1.8 (H)    GFR, Estimated  02/15/2022 >60    Anion gap 02/15/2022 5    WBC 02/15/2022 8.1    RBC 02/15/2022 3.91 (L)    Hemoglobin 02/15/2022 12.7 (L)    HCT 02/15/2022 37.4 (L)    MCV 02/15/2022 95.7    MCH 02/15/2022 32.5    MCHC 02/15/2022 34.0    RDW 02/15/2022 13.1    Platelets 02/15/2022 190    nRBC 02/15/2022 0.0    Neutrophils Relative % 02/15/2022 63    Neutro Abs 02/15/2022 5.0    Lymphocytes Relative 02/15/2022 27    Lymphs Abs 02/15/2022 2.2    Monocytes Relative 02/15/2022 6    Monocytes  Absolute 02/15/2022 0.5    Eosinophils Relative 02/15/2022 3    Eosinophils Absolute 02/15/2022 0.3    Basophils Relative 02/15/2022 1    Basophils Absolute 02/15/2022 0.0    Immature Granulocytes 02/15/2022 0    Abs Immature Granulocytes 02/15/2022 0.03    ABO/RH(D) 02/15/2022 A POS    Antibody Screen 02/15/2022 NEG    Sample Expiration 02/15/2022                     Value:02/18/2022,2359 Performed at [facility], [address], [place], [place] [address]    Prothrombin Time 02/15/2022 14.4    INR 02/15/2022 1.1    Color, Urine 02/15/2022 YELLOW    APPearance 02/15/2022 CLEAR    Specific Gravity, Urine 02/15/2022 1.021    pH 02/15/2022 5.0    Glucose, UA 02/15/2022 NEGATIVE    Hgb urine dipstick 02/15/2022 MODERATE (A)    Bilirubin Urine 02/15/2022 NEGATIVE    Ketones, ur 02/15/2022 NEGATIVE    Protein, ur 02/15/2022 NEGATIVE    Nitrite 02/15/2022 NEGATIVE    Leukocytes,Ua 02/15/2022 NEGATIVE    RBC / HPF 02/15/2022 0-5    WBC, UA 02/15/2022 0-5    Bacteria, UA 02/15/2022 NONE SEEN    Mucus 02/15/2022 PRESENT    Hyaline Casts, UA 02/15/2022 PRESENT    No image results found.   No results found.  No results found.    This encounter employed real-time, collaborative documentation. The patient actively reviewed and updated their medical record on a shared screen, ensuring transparency and facilitating joint problem-solving for the problem list, overview, and plan. This  approach promotes accurate, informed care. The treatment plan was discussed and reviewed in detail, including medication safety, potential side effects, and all patient questions. We confirmed understanding and comfort with the plan. Follow-up instructions were established, including contacting the office for any concerns, returning if symptoms worsen, persist, or new symptoms develop, and precautions for potential emergency department visits. ----------------------------------------------------- [name], MD  01/28/2023 8:04 PM  [facility]:  [phone]

## 2023-01-28 NOTE — Assessment & Plan Note (Signed)
Blood pressure is significantly elevated at 200/98, indicating potential hypertensive urgency/emergency without a clear cause. We will start Chlorthalidone for blood pressure control and diuresis. He is to resume losartan which he stopped due to feeling badly. Blood pressure, heart rate, and oxygen saturation will be monitored every 3 hours. Should blood pressure continue to rise or other concerning symptoms develop, such as chest pain, blurry vision, headache, difficulty speaking, persistent vomiting, skin color changes, or severe fatigue, he should proceed to the emergency room.

## 2023-01-28 NOTE — Assessment & Plan Note (Signed)
Reviewed Dr. Humphrey Rolls plan but its not helpful as prednisone and Z-pack failed. Therefore advised patient to see emergency room,     Provided the following artificial intelligence-generated handout and made extensive efforts to gain agreement to emergency room, but he did not want to go:  Given the patient's complex medical history and current symptoms, there is a strong recommendation for him to go to the ER. Here's why:  1. Uncontrolled hypertension: The patient's blood pressure has significantly increased over the past 10 days, reaching 200/100, which is considered a hypertensive urgency. This alone warrants immediate medical attention.  2. Respiratory symptoms: Despite the relatively clear chest X-ray, the patient's severe productive cough, wheezing, and dyspnea on exertion for two weeks are concerning, especially given his history of esophageal removal and recurrent pneumonia.  3. Failed outpatient treatment: The lack of response to azithromycin and prednisone suggests that the current condition may be more serious or resistant to standard treatments.  4. Weakness: The patient reports feeling weaker, which could be a sign of a more systemic issue.  5. Complex medical history: His history of esophageal removal and frequent pneumonia due to microaspirations makes him more vulnerable to severe respiratory complications.  However, if the patient insists on outpatient management, here's a list of critical factors to monitor and reasons to go to the ER immediately:  1. Blood pressure readings above 180/120 mmHg or symptoms like severe headache, chest pain, or shortness of breath. 2. Fever development (temperature above 100.8F or 38C). 3. Worsening respiratory symptoms, especially increased difficulty breathing or chest pain. 4. Any signs of confusion, dizziness, or altered mental status. 5. Coughing up blood or rusty-colored sputum. 6. Severe fatigue or inability to perform daily  activities. 7. Swelling in the legs or ankles (which could indicate heart failure). 8. Any new neurological symptoms like blurred vision, severe headache, or difficulty speaking. 9. Persistent vomiting or inability to keep fluids down. 10. Skin color changes, particularly bluish lips or fingers (cyanosis).  It's crucial to emphasize that given the patient's complex medical history and current symptoms, outpatient management carries significant risks. Close monitoring and immediate action if any of the above symptoms occur is absolutely essential. The safest course of action remains seeking immediate medical attention at an ER for proper evaluation and management of both the respiratory issues and the hypertensive urgency.   He still didn't want to go to emergency room so agreed to see back in office in 2 days, and try Augmentin and blood pressure medication(s) chlorthalidone. In my medical opinion this is unlikely to be sufficient, but he and his wife were present and assured me he would go to emergency room if conditions worsen or don't improve.

## 2023-01-30 ENCOUNTER — Encounter: Payer: Self-pay | Admitting: Internal Medicine

## 2023-01-30 ENCOUNTER — Ambulatory Visit (INDEPENDENT_AMBULATORY_CARE_PROVIDER_SITE_OTHER): Payer: Medicare Other | Admitting: Internal Medicine

## 2023-01-30 VITALS — BP 105/64 | HR 84 | Temp 97.6°F | Ht 66.0 in | Wt 167.2 lb

## 2023-01-30 DIAGNOSIS — Z9889 Other specified postprocedural states: Secondary | ICD-10-CM

## 2023-01-30 DIAGNOSIS — J411 Mucopurulent chronic bronchitis: Secondary | ICD-10-CM | POA: Diagnosis not present

## 2023-01-30 DIAGNOSIS — I10 Essential (primary) hypertension: Secondary | ICD-10-CM

## 2023-01-30 DIAGNOSIS — Z9049 Acquired absence of other specified parts of digestive tract: Secondary | ICD-10-CM

## 2023-01-30 NOTE — Progress Notes (Signed)
[facility]: [phone]   Routine Medical Office Visit  Patient:  Jordan [name]      Age: 77 y.o.       Sex:  male  Date:   01/30/2023 Patient Care Team: [name], MD as PCP - General (Internal Medicine) [name], MD as PCP - Cardiology (Cardiology) [name], MD as Consulting Physician (Gastroenterology) [name], MD as Consulting Physician (Pulmonary Disease) [name], MD as Consulting Physician (Neurosurgery) Today's Healthcare Provider: [name], MD   Assessment and Plan:  Close follow up  severe bronchitis and blood pressure was over 200, 2 days ago, now with mostly resolved respiratory and improved blood pressure but too low Assessment & Plan H/O esophagectomy Refer back for suspected complication of recurrent bronchitis. Mucopurulent chronic bronchitis (HCC) Acute flare now resolving Complete Augmentin, advised patient to start probiotics Due to no moe wheezing, no further steroids needed Seems to be a complication of very remote esophagectomy. Pulmonary and gastrointestinal hree seem to have no way to prevent recurrences will refer back to original academic thoracic surgeon to see if any procedural correction might be possible.  Acute exacerbation of chronic mucopurulent bronchitis He has shown significant improvement with Augmentin, likely benefiting from its effect on bacterial bronchitis secondary to GI reflux. Examination reveals no wheezing, though some crackles persist. We will continue Augmentin until the course is complete, recommend performing deep breathing exercises several times a day to clear residual mucus, and consider a referral to Dr. Ewing Schlein at [facility] for potential surgical intervention to reduce GI reflux. Hypertension, unspecified type Reviewed labs showing its goone from too high to normal at home, now asymptomatic but very low. Advised patient to monitor closely take fluids and hold blood  pressure medication(s) now as needed.\\   His blood pressure readings have been variable, with some too high and others too low while taking chlorthalidone and losartan. We will hold chlorthalidone due to low blood pressure readings and continue losartan, adjusting its timing based on blood pressure readings. Blood pressure will be closely monitored, and readings should be sent via MyChart for further management.   General Health Maintenance He should start taking probiotics or consuming yogurt to maintain healthy gut flora during antibiotic treatment. Rest and hydration are recommended until the bronchitis is fully resolved.        ED Discharge Orders          Ordered    Ambulatory referral to Cardiothoracic Surgery       Comments: Patient had esophagectomy for bariums 22 years ago with Dr. Ewing Schlein, has had recurrent laryngopharyngeal reflux related chronic bronchitis flares as a complication, hoping there might be possible options to manage this complication that Dr. Ewing Schlein is aware of.   01/30/23 1339            Recommended follow up: No follow-ups on file.  Future Appointments  Date Time Provider Department Center  10/15/2023 10:00 AM [facility] ANNUAL WELLNESS VISIT 1 [facility]           Clinical Presentation:    77 y.o. male who has GERD (gastroesophageal reflux disease); Hypertension; Allergies; Back pain at L4-L5 level; Displacement of lumbar intervertebral disc without myelopathy; Dysphagia; Dizziness; Hypertensive retinopathy; Lower urinary tract symptoms due to benign prostatic hyperplasia; Mixed hyperlipidemia; Stricture of esophagus; Chronic bronchitis (HCC); H/O esophagectomy; Mechanical complication of esophagostomy (HCC); Insomnia; PSA elevation; BPH (benign prostatic hyperplasia); Restless legs syndrome; Hyperlipidemia, acquired; Peripheral polyneuropathy; and Hiatal hernia on their  problem list. His reasons/main concerns/chief complaints for today's office visit  are 2 day follow-up (Patient states he is feeling better today.) and Acute exacerbation of chronic bronchitis   AI-Extracted: Discussed the use of AI scribe software for clinical note transcription with the patient, who gave verbal consent to proceed.  History of Present Illness   The patient, with a history of esophagectomy, presents with a recent episode of bronchitis, which he believes has improved significantly. He reports feeling much better, with the wheezing having subsided. The patient was previously treated with Z-Pak, which was ineffective, leading to a switch to Augmentin. He believes this change in medication has led to his improvement.  The patient also reports monitoring his blood pressure at home, as per previous instructions. He notes a pattern of blood pressure increasing in the evening. He has been taking both chlorthalidone and losartan for blood pressure management.  The patient also mentions a history of reflux, which he believes may be contributing to his recurrent bronchitis. He expresses interest in consulting with a specialist at [facility], where he previously underwent an esophagectomy, to explore potential solutions for the reflux.  The patient's spouse has been assisting with monitoring vitals at home, including blood oxygen levels. He has been adhering to the prescribed antibiotic regimen and has been advised to increase fluid intake to manage blood pressure fluctuations.      He  has a past medical history of Allergy, Arthritis, Barrett's esophagus, Barrett's esophagus with dysplasia (07/24/2021), BPH (benign prostatic hyperplasia), Chronic lower back pain, Chronic pain (07/24/2021), Chronic rhinitis, Chronic sinusitis (11/11/2018), ED (erectile dysfunction) of organic origin, GERD (gastroesophageal reflux disease), Hemoptysis (11/11/2018), History of wheezing (11/11/2018), Hypertension (10/20/21), Neuromuscular disorder (HCC) (2011), Neuropathy (06/18/2016), Pneumonitis due to  inhalation of food or vomitus (HCC) (07/30/2022), Thyroid disease, Tinnitus, and Urinary tract infectious disease (08/13/2022).  Problem overviews that were updated today: Problem  Chronic Bronchitis (Hcc)   Very sick very bad flare and cough July August 2024 failed Z-pack/prednisone but improved rapidly with Augmentin- will prefer Augmentin for future flares.  Recurrent due to aspirations from esophagectomy Per pulmonary Dr. Celine Mans:  has symptoms of reflux and lower micro-aspiration occasionally causing respiratory episodes.  We discussed disease management and progression at length today regarding reflux management in the setting of his esophagectomy. Most of his treatment at this point is lifestyle modification given his altered upper GI anatomy. We discussed avoiding trigger foods, sleeping up right with wedge pillow or bed risers.   If future episodes would probably just treat with prednisone for inflammation as these are not likely to require antibiotics     Current Outpatient Medications on File Prior to Visit  Medication Sig   acetaminophen (TYLENOL) 500 MG tablet Take by mouth as needed.   albuterol (VENTOLIN HFA) 108 (90 Base) MCG/ACT inhaler Inhale 2 puffs into the lungs every 6 (six) hours as needed for wheezing or shortness of breath.   Albuterol-Budesonide (AIRSUPRA) 90-80 MCG/ACT AERO INHALE 2 INTO THE LUNGS EVERY 4 HOURS AS NEEDED. REPLACES ALBUTEROL, RESCUE INHALER   alfuzosin (UROXATRAL) 10 MG 24 hr tablet Take 10 mg by mouth as needed.   amoxicillin-clavulanate (AUGMENTIN) 875-125 MG tablet Take 1 tablet by mouth 2 (two) times daily.   azithromycin (ZITHROMAX Z-PAK) 250 MG tablet Take 2 tablets (500 mg) PO today, then 1 tablet (250 mg) PO daily x4 days.   Budeson-Glycopyrrol-Formoterol (BREZTRI AEROSPHERE) 160-9-4.8 MCG/ACT AERO Inhale 2 puffs into the lungs in the morning and at bedtime.   chlorthalidone (  HYGROTON) 25 MG tablet Take 1 tablet (25 mg total) by mouth daily.    Famotidine-Ca Carb-Mag Hydrox (PEPCID COMPLETE PO) Take by mouth daily.   HYDROcodone-acetaminophen (NORCO/VICODIN) 5-325 MG tablet Take 1 tablet by mouth every 6 (six) hours as needed for moderate pain or severe pain (do not drive while taking, do not mix with other sedatives such as etoh.).   ibuprofen (ADVIL) 200 MG tablet Take 200 mg by mouth 4 (four) times daily.   levalbuterol (XOPENEX) 1.25 MG/3ML nebulizer solution Take 1.25 mg by nebulization every 4 (four) hours as needed for wheezing.   losartan (COZAAR) 50 MG tablet Take 50 mg by mouth daily as needed (BP>120).   montelukast (SINGULAIR) 10 MG tablet Take 10 mg by mouth as needed.   omeprazole (PRILOSEC) 20 MG capsule Take by mouth daily.   predniSONE (DELTASONE) 20 MG tablet Take 2 pills for 3 days, 1 pill for 4 days   pregabalin (LYRICA) 100 MG capsule Take 1 capsule (100 mg total) by mouth 2 (two) times daily.   Saw Palmetto, Serenoa repens, (SAW PALMETTO PO) Take 1 capsule by mouth daily. 450 MG   sildenafil (VIAGRA) 100 MG tablet    No current facility-administered medications on file prior to visit.   There are no discontinued medications.       Clinical Data Analysis:   Physical Exam  BP 105/64 (BP Location: Right Arm, Patient Position: Sitting)   Pulse 84   Temp 97.6 F (36.4 C) (Temporal)   Ht 5\' 6"  (1.676 m)   Wt 167 lb 3.2 oz (75.8 kg)   SpO2 98%   BMI 26.99 kg/m  Wt Readings from Last 10 Encounters:  01/30/23 167 lb 3.2 oz (75.8 kg)  01/28/23 169 lb 12.8 oz (77 kg)  01/18/23 173 lb (78.5 kg)  10/09/22 168 lb (76.2 kg)  09/11/22 168 lb 12.8 oz (76.6 kg)  08/22/22 168 lb 3.2 oz (76.3 kg)  08/13/22 169 lb (76.7 kg)  08/02/22 175 lb 6.4 oz (79.6 kg)  07/30/22 172 lb (78 kg)  12/04/21 163 lb 12.8 oz (74.3 kg)   Vital signs reviewed.  Nursing notes reviewed. Weight trend reviewed. Abnormalities and Problem-Specific physical exam findings:  lungs now almost clear, Physical Exam   VITALS: BP- 105/57 CHEST:  Initial wheezing resolved, residual crackles present initially, resolved after several deep breaths. No wheezing.  Overall looks healthier.     General Appearance:  No acute distress appreciable.   Well-groomed, healthy-appearing male.  Well proportioned with no abnormal fat distribution.  Good muscle tone. Skin: Clear and well-hydrated. Pulmonary:  Normal work of breathing at rest, no respiratory distress apparent. SpO2: 98 %  Musculoskeletal: All extremities are intact.  Neurological:  Awake, alert, oriented, and engaged.  No obvious focal neurological deficits or cognitive impairments.  Sensorium seems unclouded.   Speech is clear and coherent with logical content. Psychiatric:  Appropriate mood, pleasant and cooperative demeanor, thoughtful and engaged during the exam  Results Reviewed:     No results found for any visits on 01/30/23.  Office Visit on 01/18/2023  Component Date Value   SARS Coronavirus 2 Ag 01/18/2023 Negative   Lab on 08/02/2022  Component Date Value   Magnesium 08/02/2022 1.9    Cholesterol 08/02/2022 129    Triglycerides 08/02/2022 77.0    HDL 08/02/2022 48.30    VLDL 08/02/2022 15.4    LDL Cholesterol 08/02/2022 65    Total CHOL/HDL Ratio 08/02/2022 3    NonHDL 08/02/2022 80.50  Sodium 08/02/2022 142    Potassium 08/02/2022 4.9    Chloride 08/02/2022 107    CO2 08/02/2022 27    Glucose, Bld 08/02/2022 113 (H)    BUN 08/02/2022 26 (H)    Creatinine, Ser 08/02/2022 1.04    Total Bilirubin 08/02/2022 1.0    Alkaline Phosphatase 08/02/2022 56    AST 08/02/2022 15    ALT 08/02/2022 12    Total Protein 08/02/2022 6.3    Albumin 08/02/2022 4.0    GFR 08/02/2022 69.91    Calcium 08/02/2022 9.4    WBC 08/02/2022 8.4    RBC 08/02/2022 4.40    Hemoglobin 08/02/2022 12.0 (L)    HCT 08/02/2022 37.2 (L)    MCV 08/02/2022 84.5    MCHC 08/02/2022 32.2    RDW 08/02/2022 17.5 (H)    Platelets 08/02/2022 220.0    Neutrophils Relative % 08/02/2022 64.0     Lymphocytes Relative 08/02/2022 22.1    Monocytes Relative 08/02/2022 8.0    Eosinophils Relative 08/02/2022 5.1 (H)    Basophils Relative 08/02/2022 0.8    Neutro Abs 08/02/2022 5.3    Lymphs Abs 08/02/2022 1.8    Monocytes Absolute 08/02/2022 0.7    Eosinophils Absolute 08/02/2022 0.4    Basophils Absolute 08/02/2022 0.1    Vitamin B-12 08/02/2022 446    Folate 08/02/2022 11.2   Admission on 02/15/2022, Discharged on 02/16/2022  Component Date Value   Sodium 02/15/2022 141    Potassium 02/15/2022 5.2 (H)    Chloride 02/15/2022 108    CO2 02/15/2022 28    Glucose, Bld 02/15/2022 118 (H)    BUN 02/15/2022 24 (H)    Creatinine, Ser 02/15/2022 1.01    Calcium 02/15/2022 10.2    Total Protein 02/15/2022 6.8    Albumin 02/15/2022 4.2    AST 02/15/2022 21    ALT 02/15/2022 21    Alkaline Phosphatase 02/15/2022 50    Total Bilirubin 02/15/2022 1.8 (H)    GFR, Estimated 02/15/2022 >60    Anion gap 02/15/2022 5    WBC 02/15/2022 8.1    RBC 02/15/2022 3.91 (L)    Hemoglobin 02/15/2022 12.7 (L)    HCT 02/15/2022 37.4 (L)    MCV 02/15/2022 95.7    MCH 02/15/2022 32.5    MCHC 02/15/2022 34.0    RDW 02/15/2022 13.1    Platelets 02/15/2022 190    nRBC 02/15/2022 0.0    Neutrophils Relative % 02/15/2022 63    Neutro Abs 02/15/2022 5.0    Lymphocytes Relative 02/15/2022 27    Lymphs Abs 02/15/2022 2.2    Monocytes Relative 02/15/2022 6    Monocytes Absolute 02/15/2022 0.5    Eosinophils Relative 02/15/2022 3    Eosinophils Absolute 02/15/2022 0.3    Basophils Relative 02/15/2022 1    Basophils Absolute 02/15/2022 0.0    Immature Granulocytes 02/15/2022 0    Abs Immature Granulocytes 02/15/2022 0.03    ABO/RH(D) 02/15/2022 A POS    Antibody Screen 02/15/2022 NEG    Sample Expiration 02/15/2022                     Value:02/18/2022,2359 Performed at [facility], [address] [place], [place] [address]    Prothrombin Time 02/15/2022 14.4    INR 02/15/2022  1.1    Color, Urine 02/15/2022 YELLOW    APPearance 02/15/2022 CLEAR    Specific Gravity, Urine 02/15/2022 1.021    pH 02/15/2022 5.0    Glucose, UA 02/15/2022 NEGATIVE  Hgb urine dipstick 02/15/2022 MODERATE (A)    Bilirubin Urine 02/15/2022 NEGATIVE    Ketones, ur 02/15/2022 NEGATIVE    Protein, ur 02/15/2022 NEGATIVE    Nitrite 02/15/2022 NEGATIVE    Leukocytes,Ua 02/15/2022 NEGATIVE    RBC / HPF 02/15/2022 0-5    WBC, UA 02/15/2022 0-5    Bacteria, UA 02/15/2022 NONE SEEN    Mucus 02/15/2022 PRESENT    Hyaline Casts, UA 02/15/2022 PRESENT    Official report for xray from 5 days ago still not available.   Attestation:  I have personally spent  24 minutes involved in face-to-face and non-face-to-face activities for this patient on the day of the visit. Professional time spent includes the following activities:  Preparing to see the patient by reviewing medical records prior to and during the encounter; Obtaining, documenting, and reviewing an updated medical history; Performing a medically appropriate examination;  Evaluating, synthesizing, and documenting the available clinical information in the EMR;  Coordinating/Communicating with other health care professionals; educating on how to monitor and make decisions about the blood pressure. Collaboratively developing and communicating an individualized treatment plan with the patient; Placing medically necessary orders (for medications/tests/procedures/referrals);   This time was independent of any separately billable procedure(s).  The extended duration of this patient visit was medically necessary due to several factors:  The patient's health condition is multifaceted, requiring a comprehensive evaluation of patient and their past records to ensure accurate diagnosis and treatment planning; Effective patient education and communication, particularly for patients with complex care needs, often require additional time to ensure the patient (or  caregivers) fully understand the care plan;  Coordination of care with other healthcare professionals and services depends on thorough documentation, extending both documentation time and visit durations.  All these factors are integral to providing high-quality patient care and ensuring optimal health outcomes.  This encounter employed real-time, collaborative documentation. The patient actively reviewed and updated their medical record on a shared screen, ensuring transparency and facilitating joint problem-solving for the problem list, overview, and plan. This approach promotes accurate, informed care. The treatment plan was discussed and reviewed in detail, including medication safety, potential side effects, and all patient questions. We confirmed understanding and comfort with the plan. Follow-up instructions were established, including contacting the office for any concerns, returning if symptoms worsen, persist, or new symptoms develop, and precautions for potential emergency department visits. ----------------------------------------------------- [name], MD  01/30/2023 1:45 PM  [facility]:  [phone]

## 2023-01-30 NOTE — Assessment & Plan Note (Signed)
Refer back for suspected complication of recurrent bronchitis.

## 2023-01-30 NOTE — Assessment & Plan Note (Signed)
Reviewed labs showing its goone from too high to normal at home, now asymptomatic but very low. Advised patient to monitor closely take fluids and hold blood pressure medication(s) now as needed.\\   His blood pressure readings have been variable, with some too high and others too low while taking chlorthalidone and losartan. We will hold chlorthalidone due to low blood pressure readings and continue losartan, adjusting its timing based on blood pressure readings. Blood pressure will be closely monitored, and readings should be sent via MyChart for further management.

## 2023-01-30 NOTE — Patient Instructions (Signed)
VISIT SUMMARY:  During your visit, we discussed your recent episode of bronchitis and your ongoing management of high blood pressure. You've shown significant improvement in your bronchitis symptoms since switching to Augmentin, and we'll continue this medication until the course is complete. We also discussed your blood pressure, which has been fluctuating. We'll adjust your medication and monitor your blood pressure closely. You also mentioned your history of reflux, and we discussed the possibility of consulting with a specialist at [facility].  YOUR PLAN:  -BRONCHITIS: Bronchitis is an inflammation of the bronchial tubes in your lungs, often causing coughing, wheezing, and mucus production. We'll continue your current medication, Augmentin, until it's finished. To help clear any remaining mucus, try to perform deep breathing exercises several times a day. We're also considering referring you to a specialist at [facility] for potential surgical intervention to reduce your reflux, which may be contributing to your recurrent bronchitis.  -HIGH BLOOD PRESSURE: High blood pressure, or hypertension, is a condition where the force of blood against your artery walls is consistently too high. We're going to adjust your medication and monitor your blood pressure closely. We'll stop the chlorthalidone due to low blood pressure readings and continue with losartan, adjusting its timing based on your blood pressure readings. Please send your readings via MyChart for further management.  -GENERAL HEALTH MAINTENANCE: To maintain your overall health, start taking probiotics or eating yogurt to keep your gut flora healthy during your antibiotic treatment. Also, make sure to rest and stay hydrated until your bronchitis is fully resolved.  INSTRUCTIONS:  Please continue to monitor your blood pressure at home and send your readings via MyChart. Continue taking your Augmentin until the course is complete and perform deep breathing  exercises several times a day. Start taking probiotics or eating yogurt, rest, and stay hydrated. We'll be in touch about a potential referral to a specialist at [facility].  It was a pleasure seeing you today! Your health and satisfaction are our top priorities.  Jordan Hew, MD  Your Providers PCP: [name], MD,  [phone]) Referring Provider: [name], MD,  [phone]) Care Team Provider: [name], MD,  [phone]) Care Team Provider: [name], MD,  [phone]) Care Team Provider: [name], MD,  [phone]) Care Team Provider: [name], MD,  [phone])     NEXT STEPS: [x]  Early Intervention: Schedule sooner appointment, call our on-call services, or go to emergency room if there is any significant Increase in pain or discomfort New or worsening symptoms Sudden or severe changes in your health [x]  Flexible Follow-Up: We recommend regular routine follow ups  for optimal routine care. This allows for progress monitoring and treatment adjustments. [x]  Preventive Care: Schedule your annual preventive care visit! It's typically covered by insurance and helps identify potential health issues early. [x]  Lab & X-ray Appointments: Incomplete tests scheduled today, or call to schedule. X-rays: [facility] (M-F, 8:30am-noon or 1pm-5pm). [x]  Medical Information Release: Sign a release form at front desk to obtain relevant medical information we don't have.  MAKING THE MOST OF OUR FOCUSED 20 MINUTE APPOINTMENTS: [x]   Clearly state your top concerns at the beginning of the visit to focus our discussion [x]   If you anticipate you will need more time, please inform the front desk during scheduling - we can book multiple appointments in the same week. [x]   If you have transportation problems- use our convenient video appointments or ask about transportation support. [x]   We can get  down to business faster if you use MyChart  to update information before the visit and submit non-urgent questions before your visit. Thank you for taking the time to provide details through MyChart.  Let our nurse know and she can import this information into your encounter documents.  Arrival and Wait Times: [x]   Arriving on time ensures that everyone receives prompt attention. [x]   Early morning (8a) and afternoon (1p) appointments tend to have shortest wait times. [x]   Unfortunately, we cannot delay appointments for late arrivals or hold slots during phone calls.  Getting Answers and Following Up [x]   Simple Questions & Concerns: For quick questions or basic follow-up after your visit, reach [place] at [phone] or MyChart messaging. [x]   Complex Concerns: If your concern is more complex, scheduling an appointment might be best. Discuss this with the staff to find the most suitable option. [x]   Lab & Imaging Results: We'll contact you directly if results are abnormal or you don't use MyChart. Most normal results will be on MyChart within 2-3 business days, with a review message from Dr. Jon Billings. Haven't heard back in 2 weeks? Need results sooner? Contact us at [phone]. [x]   Referrals: Our referral coordinator will manage specialist referrals. The specialist's office should contact you within 2 weeks to schedule an appointment. Call us if you haven't heard from them after 2 weeks.  Staying Connected [x]   MyChart: Activate your MyChart for the fastest way to access results and message [place]. See the last page of this paperwork for instructions on how to activate.  Bring to Your Next Appointment [x]   Medications: Please bring all your medication bottles to your next appointment to ensure we have an accurate record of your prescriptions. [x]   Health Diaries: If you're monitoring any health conditions at home, keeping a diary of your readings can be very helpful for discussions at your next appointment.  Billing [x]   X-ray & Lab  Orders: These are billed by separate companies. Contact the invoicing company directly for questions or concerns. [x]   Visit Charges: Discuss any billing inquiries with our administrative services team.  Your Satisfaction Matters [x]   Share Your Experience: We strive for your satisfaction! If you have any complaints, or preferably compliments, please let Dr. Jon Billings know directly or contact our Practice Administrators, Edwena Felty or [employer], by asking at the front desk.   Reviewing Your Records [x]   Review this early draft of your clinical encounter notes below and the final encounter summary tomorrow on MyChart after its been completed.  All orders placed so far are visible here: Mucopurulent chronic bronchitis (HCC) -     Ambulatory referral to Cardiothoracic Surgery  H/O esophagectomy -     Ambulatory referral to Cardiothoracic Surgery  Hypertension, unspecified type

## 2023-01-30 NOTE — Assessment & Plan Note (Signed)
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 Pulmonary and gastrointestinal hree seem to have no way to prevent recurrences will refer back to original academic thoracic surgeon to see if any procedural correction might be possible.  Acute exacerbation of chronic mucopurulent bronchitis He has shown significant improvement with Augmentin, likely benefiting from its effect on bacterial bronchitis secondary to GI reflux. Examination reveals no wheezing, though some crackles persist. We will continue Augmentin until the course is complete, recommend performing deep breathing exercises several times a day to clear residual mucus, and consider a referral to Dr. Ewing Schlein at [facility] for potential surgical intervention to reduce GI reflux.

## 2023-02-02 NOTE — Progress Notes (Signed)
Final radiologist report also did not see pneumonia, just increased lung markings which I called bronchitis.  Anyway we know Augmentin helped it this time.

## 2023-02-12 ENCOUNTER — Encounter: Payer: Self-pay | Admitting: Internal Medicine

## 2023-02-19 ENCOUNTER — Encounter: Payer: Self-pay | Admitting: Family Medicine

## 2023-02-19 ENCOUNTER — Ambulatory Visit (INDEPENDENT_AMBULATORY_CARE_PROVIDER_SITE_OTHER): Payer: Medicare Other | Admitting: Family Medicine

## 2023-02-19 VITALS — BP 164/74 | HR 63 | Temp 97.7°F | Ht 66.0 in | Wt 172.0 lb

## 2023-02-19 DIAGNOSIS — I10 Essential (primary) hypertension: Secondary | ICD-10-CM | POA: Diagnosis not present

## 2023-02-19 DIAGNOSIS — J411 Mucopurulent chronic bronchitis: Secondary | ICD-10-CM | POA: Diagnosis not present

## 2023-02-19 DIAGNOSIS — R059 Cough, unspecified: Secondary | ICD-10-CM

## 2023-02-19 LAB — POC COVID19 BINAXNOW: SARS Coronavirus 2 Ag: NEGATIVE

## 2023-02-19 MED ORDER — AMOXICILLIN-POT CLAVULANATE 875-125 MG PO TABS: 1.0000 | ORAL_TABLET | Freq: Two times a day (BID) | ORAL | 0 refills | Status: DC

## 2023-02-19 MED ORDER — PANTOPRAZOLE SODIUM 40 MG PO TBEC: 40.0000 mg | DELAYED_RELEASE_TABLET | Freq: Every day | ORAL | 5 refills | Status: DC

## 2023-02-19 NOTE — Patient Instructions (Signed)
It was very nice to see you today!  Please start the Augmentin for your cough  Please try the Protonix to help with reducing reflux.  Given on your blood pressure and let us know if it is persistently elevated.  Return if symptoms worsen or fail to improve.   Take care, Dr Jimmey Ralph  PLEASE NOTE:  If you had any lab tests, please let us know if you have not heard back within a few days. You may see your results on mychart before we have a chance to review them but we will give you a call once they are reviewed by [place].   If we ordered any referrals today, please let us know if you have not heard from their office within the next week.   If you had any urgent prescriptions sent in today, please check with the pharmacy within an hour of our visit to make sure the prescription was transmitted appropriately.   Please try these tips to maintain a healthy lifestyle:  Eat at least 3 REAL meals and 1-2 snacks per day.  Aim for no more than 5 hours between eating.  If you eat breakfast, please do so within one hour of getting up.   Each meal should contain half fruits/vegetables, one quarter protein, and one quarter carbs (no bigger than a computer mouse)  Cut down on sweet beverages. This includes juice, soda, and sweet tea.   Drink at least 1 glass of water with each meal and aim for at least 8 glasses per day  Exercise at least 150 minutes every week.

## 2023-02-19 NOTE — Progress Notes (Signed)
   Jordan [name] is a 77 y.o. male who presents today for an office visit.  Assessment/Plan:  New/Acute Problems: Cough  Due to recurrent aspiration secondary to poorly controlled GERD secondary to history of esophagectomy.  No red flag signs or symptoms and overall reassuring lung exam today. He has done well with Augmentin in the past.  We will restart this today.  Will be treating his reflux and GERD as below.  We discussed reasons to return to care and seek emergent care.  Follow-up as needed.  Chronic Problems Addressed Today: GERD On omeprazole 20 mg daily.  We will switch to Protonix 40 mg daily.  Hopefully this will help prevent aspiration events.  Essential Hypertension   Elevated today in setting of acute illness.  Typically well-controlled at home.He will continue his current medications with losartan 50 mg daily, and chlorthalidone 25 mg daily and let us know if persistently elevated at home.    Subjective:  HPI:  See A/P for status of chronic conditions.  Patient is here today for cough and congestion.  He has have been having intermittent issues with aspiration pneumonia related to a history of esophagectomy about 20 years ago. He has previously been following with pulmonology and gastroenterology for this. Usually does well but most recently had a flare up about a month ago that he was followed by his PCP. They gave him a course of amoxicillin and symptoms did improve.  He was doing well for a few weeks however 2 days ago had a recurrence of symptoms.  He was out of town when he had a blooming onion at [employer].  That night had severe reflux.  The next day started having more cough and congestion that has persisted for the last couple of days.  No fevers or chills.  Cough is productive of yellow phlegm.  No chest pain.  No shortness of breath.        Objective:  Physical Exam: BP (!) 164/74   Pulse 63   Temp 97.7 F (36.5 C) (Temporal)   Ht 5\' 6"  (1.676 m)   Wt 172  lb (78 kg)   SpO2 96%   BMI 27.76 kg/m   Gen: No acute distress, resting comfortably CV: Regular rate and rhythm with no murmurs appreciated Pulm: Normal work of breathing, diffuse wheezing rhonchi throughout all lung fields. Neuro: Grossly normal, moves all extremities Psych: Normal affect and thought content      Zayla Agar M. Jimmey Ralph, MD 02/19/2023 2:58 PM

## 2023-02-26 ENCOUNTER — Other Ambulatory Visit: Payer: Self-pay

## 2023-02-26 DIAGNOSIS — R1319 Other dysphagia: Secondary | ICD-10-CM

## 2023-02-26 DIAGNOSIS — K219 Gastro-esophageal reflux disease without esophagitis: Secondary | ICD-10-CM

## 2023-02-26 DIAGNOSIS — Z9889 Other specified postprocedural states: Secondary | ICD-10-CM

## 2023-02-26 DIAGNOSIS — K222 Esophageal obstruction: Secondary | ICD-10-CM

## 2023-02-26 DIAGNOSIS — K9433 Esophagostomy malfunction: Secondary | ICD-10-CM

## 2023-03-15 ENCOUNTER — Ambulatory Visit (INDEPENDENT_AMBULATORY_CARE_PROVIDER_SITE_OTHER): Payer: Medicare Other | Admitting: Family

## 2023-03-15 ENCOUNTER — Encounter: Payer: Self-pay | Admitting: Family

## 2023-03-15 ENCOUNTER — Other Ambulatory Visit: Payer: Self-pay | Admitting: Family

## 2023-03-15 ENCOUNTER — Telehealth: Payer: Self-pay

## 2023-03-15 VITALS — BP 109/75 | HR 61 | Temp 97.5°F | Ht 66.0 in | Wt 173.4 lb

## 2023-03-15 DIAGNOSIS — K219 Gastro-esophageal reflux disease without esophagitis: Secondary | ICD-10-CM

## 2023-03-15 DIAGNOSIS — J209 Acute bronchitis, unspecified: Secondary | ICD-10-CM | POA: Diagnosis not present

## 2023-03-15 DIAGNOSIS — J42 Unspecified chronic bronchitis: Secondary | ICD-10-CM

## 2023-03-15 MED ORDER — PREDNISONE 10 MG PO TABS
ORAL_TABLET | ORAL | 0 refills | Status: DC
Start: 2023-03-15 — End: 2023-04-26

## 2023-03-15 MED ORDER — BREZTRI AEROSPHERE 160-9-4.8 MCG/ACT IN AERO: 2.0000 | INHALATION_SPRAY | Freq: Two times a day (BID) | RESPIRATORY_TRACT | Status: DC

## 2023-03-15 MED ORDER — IPRATROPIUM BROMIDE 0.02 % IN SOLN: 0.5000 mg | Freq: Four times a day (QID) | RESPIRATORY_TRACT | 1 refills | Status: DC

## 2023-03-15 NOTE — Telephone Encounter (Signed)
I called pt in regards to message below, pt verbalized understanding.

## 2023-03-15 NOTE — Patient Instructions (Addendum)
It was very nice to see you today!   I gave you a sample of Breztri inhaler. Use this twice a day, 2 puffs. You need to ask your pharmacy about this medication as Dr Jon Billings sent it in 8/2 - maybe your copay was too high? Let Dr Jon Billings know.  I also am sending Atrovent to add to your Xopenex in your nebulizer, this should help loosen the mucus in your chest and help you cough it up easier. You should use your nebulizer at least 3 times per day for the next week and then back off if your cough is better. And I have sent another round of Prednisone, take this in the mornings  Drink at least 2 liters of water every day!     PLEASE NOTE:  If you had any lab tests please let us know if you have not heard back within a few days. You may see your results on MyChart before we have a chance to review them but we will give you a call once they are reviewed by [place]. If we ordered any referrals today, please let us know if you have not heard from their office within the next week.

## 2023-03-15 NOTE — Progress Notes (Signed)
Patient ID: Jordan [name], male    DOB: 06-30-1945, 77 y.o.   MRN: [id number]  Chief Complaint  Patient presents with   Cough    Pt c/o Cough and wheezing, present since Tuesday. Has tried coricidin and breathing treatments which did help slightly.    *Discussed the use of AI scribe software for clinical note transcription with the patient, who gave verbal consent to proceed.  History of Present Illness   The patient, with a history of chronic bronchitis and acid reflux, presents with persistent cough and chest congestion. The patient reports that the symptoms are due to acid reflux and aspiration into the lungs. The patient has been on Protonix for acid reflux and completed a course of Augmentin for bronchitis 2w ago. He also was treated by PCP  August 2 with a Zpack, prednisone and 2 inhalers. Despite these treatments, the patient reports a recent episode of severe reflux leading to increased chest congestion and production of yellow phlegm. The patient uses a nebulizer with Levalbuterol twice daily and takes Corcidin for chest congestion. The patient also reports using a nasal spray for mucus build-up in the throat. The patient has a history of using various inhalers for breathing treatments, but reports limited improvement with the most recent light blue inhaler, which is not AirSupra which was ordered by PCP on 8/2 along with Markus Daft (pt states he did not get this).         Assessment & Plan:     Gastroesophageal Reflux Disease (GERD)- Chronic condition with recent exacerbation leading to aspiration and bronchitis. Currently managed with Protonix 40mg  daily. -Continue Protonix 40mg  daily.  Chronic Bronchitis - Recent exacerbation with increased cough and yellow phlegm production. Previous treatment with Augmentin was effective. Currently using Levalbuterol nebulizer and over-the-counter Corcidin for chest congestion. -Start Breztri inhaler twice daily for 7 days. Check with PCP and  pharmacy about copay cost for ongoing tx. -Adding Ipratropium to Levalbuterol nebulizer treatments 3-4xdaily until symptoms improve. -Start Prednisone with a slow taper over 7 days. -Consider use of over-the-counter generic Flonase or Nasacort for potential post-nasal drip contributing to cough.  Follow-up Monitor response to new treatments and adjust as necessary.      Subjective:    Outpatient Medications Prior to Visit  Medication Sig Dispense Refill   acetaminophen (TYLENOL) 500 MG tablet Take by mouth as needed.     albuterol (VENTOLIN HFA) 108 (90 Base) MCG/ACT inhaler Inhale 2 puffs into the lungs every 6 (six) hours as needed for wheezing or shortness of breath. 1 each 1   alfuzosin (UROXATRAL) 10 MG 24 hr tablet Take 10 mg by mouth as needed.     chlorthalidone (HYGROTON) 25 MG tablet Take 1 tablet (25 mg total) by mouth daily. 90 tablet 0   Famotidine-Ca Carb-Mag Hydrox (PEPCID COMPLETE PO) Take by mouth daily.     HYDROcodone-acetaminophen (NORCO/VICODIN) 5-325 MG tablet Take 1 tablet by mouth every 6 (six) hours as needed for moderate pain or severe pain (do not drive while taking, do not mix with other sedatives such as etoh.). 45 tablet 0   ibuprofen (ADVIL) 200 MG tablet Take 200 mg by mouth 4 (four) times daily.     levalbuterol (XOPENEX) 1.25 MG/3ML nebulizer solution Take 1.25 mg by nebulization every 4 (four) hours as needed for wheezing. 30 mL 1   losartan (COZAAR) 50 MG tablet Take 50 mg by mouth daily as needed (BP>120).     montelukast (SINGULAIR) 10 MG tablet Take 10  mg by mouth as needed.     pantoprazole (PROTONIX) 40 MG tablet Take 1 tablet (40 mg total) by mouth daily. 30 tablet 5   pregabalin (LYRICA) 100 MG capsule Take 1 capsule (100 mg total) by mouth 2 (two) times daily. 60 capsule 2   Saw Palmetto, Serenoa repens, (SAW PALMETTO PO) Take 1 capsule by mouth daily. 450 MG     sildenafil (VIAGRA) 100 MG tablet      Albuterol-Budesonide (AIRSUPRA) 90-80 MCG/ACT  AERO INHALE 2 INTO THE LUNGS EVERY 4 HOURS AS NEEDED. REPLACES ALBUTEROL, RESCUE INHALER 10.7 g 11   Budeson-Glycopyrrol-Formoterol (BREZTRI AEROSPHERE) 160-9-4.8 MCG/ACT AERO Inhale 2 puffs into the lungs in the morning and at bedtime. 1 each 1   omeprazole (PRILOSEC) 20 MG capsule Take by mouth daily.     amoxicillin-clavulanate (AUGMENTIN) 875-125 MG tablet Take 1 tablet by mouth 2 (two) times daily. (Patient not taking: Reported on 03/15/2023) 20 tablet 0   No facility-administered medications prior to visit.   Past Medical History:  Diagnosis Date   Allergy    Arthritis    back, shoulder- both    Barrett's esophagus    Barrett's esophagus with dysplasia 07/24/2021   BPH (benign prostatic hyperplasia)    Chronic lower back pain    Herniated disc at L4   Chronic pain 07/24/2021   Chronic rhinitis    Chronic sinusitis 11/11/2018   ED (erectile dysfunction) of organic origin    GERD (gastroesophageal reflux disease)    Barrett's Esophagus, treated /w esophagectomy - 1998, continues toi have reflux & uses Zantac on a regular basis    Hemoptysis 11/11/2018   History of wheezing 11/11/2018   Hypertension 10/20/21   Neuromuscular disorder (HCC) 2011   Tingling feet   Neuropathy 06/18/2016   Pneumonitis due to inhalation of food or vomitus (HCC) 07/30/2022   Thyroid disease    Tinnitus    hearing loss in both ears    Urinary tract infectious disease 08/13/2022   Past Surgical History:  Procedure Laterality Date   CATARACT EXTRACTION Bilateral 2019   Dr. Alben Spittle   ESOPHAGECTOMY     1998- at [facility], /w epidural & sedation & pain management /w continued  epidural    EYE SURGERY  2018   Cataracts   HERNIA REPAIR     LUMBAR LAMINECTOMY/DECOMPRESSION MICRODISCECTOMY  03/22/2012   Procedure: LUMBAR LAMINECTOMY/DECOMPRESSION MICRODISCECTOMY 1 LEVEL;  Surgeon: Tia Alert, MD;  Location: [facility];  Service: Neurosurgery;  Laterality: Right;  right lumbar four-five extra-foraminal  microdiscectomy   SPINE SURGERY  2013   Ruptured disc L4   Allergies  Allergen Reactions   Latex    Adhesive [Tape] Rash      Objective:    Physical Exam Vitals and nursing note reviewed.  Constitutional:      General: He is not in acute distress.    Appearance: Normal appearance.  HENT:     Head: Normocephalic.  Cardiovascular:     Rate and Rhythm: Normal rate and regular rhythm.  Pulmonary:     Effort: Pulmonary effort is normal.     Breath sounds: Examination of the right-upper field reveals rhonchi. Examination of the left-upper field reveals rhonchi. Examination of the right-middle field reveals rhonchi. Examination of the left-middle field reveals rhonchi. Examination of the right-lower field reveals rhonchi. Examination of the left-lower field reveals rhonchi. Rhonchi present.  Musculoskeletal:        General: Normal range of motion.     Cervical  back: Normal range of motion.  Skin:    General: Skin is warm and dry.  Neurological:     Mental Status: He is alert and oriented to person, place, and time.  Psychiatric:        Mood and Affect: Mood normal.    BP 109/75 (BP Location: Left Arm, Patient Position: Sitting, Cuff Size: Large)   Pulse 61   Temp (!) 97.5 F (36.4 C) (Temporal)   Ht 5\' 6"  (1.676 m)   Wt 173 lb 6 oz (78.6 kg)   SpO2 95%   BMI 27.98 kg/m  Wt Readings from Last 3 Encounters:  03/15/23 173 lb 6 oz (78.6 kg)  02/19/23 172 lb (78 kg)  01/30/23 167 lb 3.2 oz (75.8 kg)      Dulce Sellar, NP

## 2023-03-15 NOTE — Telephone Encounter (Signed)
-----   Message from Sherman sent at 03/15/2023  1:13 PM EDT ----- Regarding: nebulizer med call pt and let him know his insurance would only cover the combination of the albuterol and Atrovent (that I ordered today) in one combined vial. So he should not use the Xopenex in his nebulizer, just the new one I sent, so only one vial or bullet with each treatment.

## 2023-04-09 ENCOUNTER — Encounter: Payer: Self-pay | Admitting: Internal Medicine

## 2023-04-15 ENCOUNTER — Encounter: Payer: Self-pay | Admitting: Internal Medicine

## 2023-04-15 ENCOUNTER — Ambulatory Visit (INDEPENDENT_AMBULATORY_CARE_PROVIDER_SITE_OTHER): Payer: Medicare Other | Admitting: Internal Medicine

## 2023-04-15 VITALS — BP 142/62 | HR 60 | Temp 97.3°F | Ht 66.0 in | Wt 173.6 lb

## 2023-04-15 DIAGNOSIS — G629 Polyneuropathy, unspecified: Secondary | ICD-10-CM

## 2023-04-15 DIAGNOSIS — M1812 Unilateral primary osteoarthritis of first carpometacarpal joint, left hand: Secondary | ICD-10-CM | POA: Diagnosis not present

## 2023-04-15 DIAGNOSIS — R42 Dizziness and giddiness: Secondary | ICD-10-CM

## 2023-04-15 DIAGNOSIS — M545 Low back pain, unspecified: Secondary | ICD-10-CM | POA: Diagnosis not present

## 2023-04-15 DIAGNOSIS — I10 Essential (primary) hypertension: Secondary | ICD-10-CM | POA: Diagnosis not present

## 2023-04-15 DIAGNOSIS — E785 Hyperlipidemia, unspecified: Secondary | ICD-10-CM | POA: Diagnosis not present

## 2023-04-15 DIAGNOSIS — K219 Gastro-esophageal reflux disease without esophagitis: Secondary | ICD-10-CM | POA: Diagnosis not present

## 2023-04-15 DIAGNOSIS — E65 Localized adiposity: Secondary | ICD-10-CM

## 2023-04-15 MED ORDER — PREGABALIN 100 MG PO CAPS: 100.0000 mg | ORAL_CAPSULE | Freq: Two times a day (BID) | ORAL | 2 refills | Status: DC

## 2023-04-15 MED ORDER — HYDROCODONE-ACETAMINOPHEN 5-325 MG PO TABS: 1.0000 | ORAL_TABLET | Freq: Four times a day (QID) | ORAL | 0 refills | Status: DC | PRN

## 2023-04-15 NOTE — Assessment & Plan Note (Signed)
His blood pressure was slightly elevated during the visit, and he has been taking losartan only when his blood pressure is high. We advise taking losartan daily to maintain consistent blood pressure control.
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
## 2023-04-15 NOTE — Patient Instructions (Signed)
Patient Handout: Thumb Arthritis (Basal Joint Arthritis) - Treatment Options What is thumb arthritis? Thumb arthritis, also known as basal joint arthritis or carpometacarpal (CMC) arthritis, is a common condition that causes pain and stiffness at the base of the thumb. It results from wear and tear on the joint over time. Symptoms:  Pain, swelling, and grinding sensation with thumb movement, especially pinching and gripping Difficulty with daily activities that require thumb use, such as opening jars, turning keys, and writing  Treatment Options:  Conservative management:  Activity modification - Avoid repetitive or high-force activities that aggravate symptoms. Anti-inflammatory medications to reduce pain and inflammation. Splinting or bracing the thumb to support and immobilize the joint. Steroid injections into the joint to provide temporary pain relief. Physical therapy to strengthen surrounding muscles and improve joint mechanics.   Surgical options (if conservative treatments fail):  Arthroscopic debridement - Minimally invasive procedure to remove damaged joint tissue. Partial joint replacement (hemiarthroplasty) - Replaces the base of the thumb metacarpal bone. Total joint replacement (arthroplasty) - Replaces the entire basal joint. Ligament reconstruction and tendon interposition (LRTI) - Reconstructs the joint with tendon material. Arthrodesis (joint fusion) - Permanently fuses the joint to eliminate motion and pain.    Your doctor will work with you to develop the most appropriate treatment plan based on the severity of your condition and your individual needs and preferences.

## 2023-04-15 NOTE — Assessment & Plan Note (Signed)
He experiences chronic pain in the base of the left thumb, likely due to carpometacarpal arthritis, and conservative treatments have failed. We will refer him to a hand surgeon for further evaluation and potential surgical intervention and consider Voltaren ointment for topical pain relief.

## 2023-04-15 NOTE — Assessment & Plan Note (Signed)
He reports some early morning reflux symptoms despite taking pantoprazole. We will continue the current management with pantoprazole and lifestyle modifications.

## 2023-04-15 NOTE — Assessment & Plan Note (Signed)
Increased activity has led to more frequent acute flares of his chronic lower back pain, necessitating increased use of hydrocodone. There are no signs of misuse or overuse. We will refill the hydrocodone prescription for 45 tablets for use as needed for acute pain flares.

## 2023-04-17 ENCOUNTER — Other Ambulatory Visit: Payer: Medicare Other

## 2023-04-18 ENCOUNTER — Other Ambulatory Visit (INDEPENDENT_AMBULATORY_CARE_PROVIDER_SITE_OTHER): Payer: Medicare Other

## 2023-04-18 DIAGNOSIS — E785 Hyperlipidemia, unspecified: Secondary | ICD-10-CM | POA: Diagnosis not present

## 2023-04-18 DIAGNOSIS — Z9049 Acquired absence of other specified parts of digestive tract: Secondary | ICD-10-CM

## 2023-04-18 DIAGNOSIS — Z9889 Other specified postprocedural states: Secondary | ICD-10-CM

## 2023-04-18 DIAGNOSIS — G622 Polyneuropathy due to other toxic agents: Secondary | ICD-10-CM

## 2023-04-18 DIAGNOSIS — G619 Inflammatory polyneuropathy, unspecified: Secondary | ICD-10-CM

## 2023-04-18 LAB — COMPREHENSIVE METABOLIC PANEL
ALT: 16 U/L (ref 0–53)
AST: 19 U/L (ref 0–37)
Albumin: 3.9 g/dL (ref 3.5–5.2)
Alkaline Phosphatase: 49 U/L (ref 39–117)
BUN: 26 mg/dL — ABNORMAL HIGH (ref 6–23)
CO2: 29 meq/L (ref 19–32)
Calcium: 9.1 mg/dL (ref 8.4–10.5)
Chloride: 106 meq/L (ref 96–112)
Creatinine, Ser: 1.05 mg/dL (ref 0.40–1.50)
GFR: 68.77 mL/min (ref >60.00)
Glucose, Bld: 119 mg/dL — ABNORMAL HIGH (ref 70–99)
Potassium: 4.4 meq/L (ref 3.5–5.1)
Sodium: 142 meq/L (ref 135–145)
Total Bilirubin: 1.1 mg/dL (ref 0.2–1.2)
Total Protein: 6.1 g/dL (ref 6.0–8.3)

## 2023-04-18 LAB — CBC
HCT: 37.1 % — ABNORMAL LOW (ref 39.0–52.0)
Hemoglobin: 12 g/dL — ABNORMAL LOW (ref 13.0–17.0)
MCHC: 32.3 g/dL (ref 30.0–36.0)
MCV: 86.5 fL (ref 78.0–100.0)
Platelets: 207 10*3/uL (ref 150.0–400.0)
RBC: 4.29 Mil/uL (ref 4.22–5.81)
RDW: 15 % (ref 11.5–15.5)
WBC: 6.7 10*3/uL (ref 4.0–10.5)

## 2023-04-18 LAB — LIPID PANEL
Cholesterol: 124 mg/dL (ref 0–200)
HDL: 45.3 mg/dL (ref >39.00)
LDL Cholesterol: 60 mg/dL (ref 0–99)
NonHDL: 78.68
Total CHOL/HDL Ratio: 3
Triglycerides: 92 mg/dL (ref 0.0–149.0)
VLDL: 18.4 mg/dL (ref 0.0–40.0)

## 2023-04-18 LAB — MAGNESIUM: Magnesium: 2 mg/dL (ref 1.5–2.5)

## 2023-04-18 LAB — B12 AND FOLATE PANEL
Folate: 17 ng/mL (ref >5.9)
Vitamin B-12: 844 pg/mL (ref 211–911)

## 2023-04-19 ENCOUNTER — Encounter: Payer: Self-pay | Admitting: Internal Medicine

## 2023-04-19 DIAGNOSIS — D649 Anemia, unspecified: Secondary | ICD-10-CM | POA: Insufficient documentation

## 2023-04-19 NOTE — Progress Notes (Signed)
Reviewed results from 04/18/2023. Labs show stable mild anemia (Hgb 12.0), mildly elevated glucose (119), and well-controlled lipids. B12 and folate levels optimal. Chest X-ray (02/01/2023) shows chronic changes without acute disease. BP elevated at visit (142/62), likely due to intermittent medication use - emphasized importance of daily losartan. Multiple chronic conditions stable. Hand surgery referral placed for CMC arthritis. Detailed explanation sent via Patient Message. Follow-up scheduled for annual wellness visit 10/15/2023.

## 2023-04-22 NOTE — Telephone Encounter (Signed)
Informed patient of lab results/notes.

## 2023-04-26 ENCOUNTER — Encounter: Payer: Self-pay | Admitting: Internal Medicine

## 2023-04-26 ENCOUNTER — Ambulatory Visit (INDEPENDENT_AMBULATORY_CARE_PROVIDER_SITE_OTHER): Payer: Medicare Other | Admitting: Internal Medicine

## 2023-04-26 VITALS — BP 146/78 | HR 89 | Temp 98.2°F | Ht 66.0 in | Wt 171.2 lb

## 2023-04-26 DIAGNOSIS — J320 Chronic maxillary sinusitis: Secondary | ICD-10-CM

## 2023-04-26 DIAGNOSIS — J690 Pneumonitis due to inhalation of food and vomit: Secondary | ICD-10-CM

## 2023-04-26 DIAGNOSIS — K219 Gastro-esophageal reflux disease without esophagitis: Secondary | ICD-10-CM | POA: Diagnosis not present

## 2023-04-26 DIAGNOSIS — R0602 Shortness of breath: Secondary | ICD-10-CM

## 2023-04-26 DIAGNOSIS — J209 Acute bronchitis, unspecified: Secondary | ICD-10-CM

## 2023-04-26 DIAGNOSIS — I10 Essential (primary) hypertension: Secondary | ICD-10-CM

## 2023-04-26 DIAGNOSIS — J411 Mucopurulent chronic bronchitis: Secondary | ICD-10-CM

## 2023-04-26 MED ORDER — METHYLPREDNISOLONE ACETATE 80 MG/ML IJ SUSP
80.0000 mg | Freq: Once | INTRAMUSCULAR | Status: AC
  Administered 2023-04-26: 80 mg via INTRAMUSCULAR

## 2023-04-26 MED ORDER — IPRATROPIUM-ALBUTEROL 0.5-2.5 (3) MG/3ML IN SOLN: 3.0000 mL | Freq: Four times a day (QID) | RESPIRATORY_TRACT | 1 refills | Status: DC | PRN

## 2023-04-26 MED ORDER — PROBIOTIC DAILY PO CAPS: 1.0000 | ORAL_CAPSULE | Freq: Every day | ORAL | 2 refills | Status: DC

## 2023-04-26 MED ORDER — AMOXICILLIN-POT CLAVULANATE 875-125 MG PO TABS: 1.0000 | ORAL_TABLET | Freq: Two times a day (BID) | ORAL | 0 refills | Status: DC

## 2023-04-26 MED ORDER — PREDNISONE 10 MG PO TABS: ORAL_TABLET | ORAL | 0 refills | Status: DC

## 2023-04-26 NOTE — Assessment & Plan Note (Signed)
Hypertension   His blood pressure is elevated, likely because he did not take his melasartan this morning. We discussed the risks of uncontrolled hypertension, including stroke and heart attack, and the benefits of medication adherence, such as controlled blood pressure and reduced risk of complications. We advise him to resume melasartan as prescribed.

## 2023-04-26 NOTE — Progress Notes (Signed)
[facility]: [phone]   -- Medical Office Visit --  Patient:  Jordan [name]      Age: 77 y.o.       Sex:  male  Date:   04/26/2023 Today's Healthcare Provider: [name], MD  ==========================================================================     Assessment & Plan Mucopurulent chronic bronchitis (HCC)  Recurrent aspiration pneumonia (HCC) Aspiration Pneumonia   He has recurrent aspiration pneumonia, likely due to GERD, presenting with a yellowish, sometimes black, productive cough, shortness of breath, and malaise. Examination reveals right-sided pneumonia. He has responded well to antibiotics in the past. We discussed the risks of untreated pneumonia, including respiratory failure and sepsis, and the benefits of treatment, such as infection resolution and improved breathing. Treatment alternatives include different antibiotics or hospitalization if there is no improvement. We will prescribe Augmentin, administer methylprednisolone acetate 80 mg IM, prescribe prednisone and probiotics, and order a nebulizer solution. Aspiration pneumonia of right lower lobe due to gastric secretions (HCC)  Chronic maxillary sinusitis Sinusitis   He possibly has a sinus infection contributing to his symptoms, with reports of nasal congestion and previous soreness in the right nostril. We discussed the risks of untreated sinusitis, including chronic infection, and the benefits of monitoring to avoid unnecessary antibiotics. Treatment alternatives include nasal decongestants or antibiotics if symptoms persist. We will monitor symptoms and consider further evaluation if symptoms persist. Chronic bronchitis with acute exacerbation (HCC) Chronic Obstructive Pulmonary Disease (COPD) Flare   His COPD flare is secondary to aspiration pneumonia, with symptoms of wheezing and shortness of breath. Previous steroid treatments have been effective. We discussed the risks of an  untreated COPD flare, including worsening respiratory function, and the benefits of treatment, which include reduced inflammation and improved breathing. Treatment alternatives include different steroids or bronchodilators. We will administer methylprednisolone acetate 80 mg IM, prescribe prednisone, and order a nebulizer solution. Shortness of breath  Gastroesophageal reflux disease, unspecified whether esophagitis present Gastroesophageal Reflux Disease (GERD)   His GERD is contributing to recurrent aspiration pneumonia. He reports improvement with pantoprazole. We discussed the risks of untreated GERD, including esophagitis and aspiration pneumonia, and the benefits of treatment, such as reduced reflux and prevention of pneumonia. Treatment alternatives include different proton pump inhibitors or lifestyle modifications. We will continue pantoprazole. Hypertension, unspecified type Hypertension   His blood pressure is elevated, likely because he did not take his melasartan this morning. We discussed the risks of uncontrolled hypertension, including stroke and heart attack, and the benefits of medication adherence, such as controlled blood pressure and reduced risk of complications. We advise him to resume melasartan as prescribed.  ED Discharge Orders          Ordered    METHYLPREDNISOLONE ACETATE 80 MG/ML IJ SUSP   Once        04/26/23 1218    amoxicillin-clavulanate (AUGMENTIN) 875-125 MG tablet  2 times daily        04/26/23 1159    predniSONE (DELTASONE) 10 MG tablet        04/26/23 1159    Probiotic Product (PROBIOTIC DAILY) CAPS  Daily        04/26/23 1159    ipratropium-albuterol (DUONEB) 0.5-2.5 (3) MG/3ML SOLN  Every 6 hours PRN       Note to Pharmacy: D/C Xopenex and Atrovent   04/26/23 1200          Diagnoses and all orders for this visit: Mucopurulent chronic bronchitis (HCC) -     Probiotic Product (PROBIOTIC DAILY)  CAPS; Take 1 capsule by mouth daily. Recurrent  aspiration pneumonia (HCC) -     Probiotic Product (PROBIOTIC DAILY) CAPS; Take 1 capsule by mouth daily. Aspiration pneumonia of right lower lobe due to gastric secretions (HCC) -     amoxicillin-clavulanate (AUGMENTIN) 875-125 MG tablet; Take 1 tablet by mouth 2 (two) times daily. -     Probiotic Product (PROBIOTIC DAILY) CAPS; Take 1 capsule by mouth daily. Chronic maxillary sinusitis -     Probiotic Product (PROBIOTIC DAILY) CAPS; Take 1 capsule by mouth daily. -     methylPREDNISolone acetate (DEPO-MEDROL) injection 80 mg Chronic bronchitis with acute exacerbation (HCC) -     predniSONE (DELTASONE) 10 MG tablet; Take with breakfast:  6 tab day 1, 5 tab day 2-3, 4 tab day 4, 3 tab day 5-7 -     Probiotic Product (PROBIOTIC DAILY) CAPS; Take 1 capsule by mouth daily. -     ipratropium-albuterol (DUONEB) 0.5-2.5 (3) MG/3ML SOLN; Take 3 mLs by nebulization every 6 (six) hours as needed. Shortness of breath Gastroesophageal reflux disease, unspecified whether esophagitis present Hypertension, unspecified type  Recommended follow-up: Follow-up   We advise scheduling a follow-up with a gastroenterologist. Future Appointments  Date Time Provider Department Center  10/15/2023 10:00 AM [facility] ANNUAL WELLNESS VISIT 1 [facility]  Patient Care Team: [name], MD as PCP - General (Internal Medicine) [name], MD as PCP - Cardiology (Cardiology) [name], MD as Consulting Physician (Gastroenterology) [name], MD as Consulting Physician (Pulmonary Disease) [name], MD as Consulting Physician (Neurosurgery)    SUBJECTIVE: 77 y.o. male who has GERD (gastroesophageal reflux disease); Hypertension; Allergies; Back pain at L4-L5 level; Displacement of lumbar intervertebral disc without myelopathy; Dysphagia; Dizziness; Hypertensive retinopathy; Lower urinary tract symptoms due to benign prostatic hyperplasia; Mixed hyperlipidemia; Stricture of esophagus;  Chronic bronchitis (HCC); H/O esophagectomy; Mechanical complication of esophagostomy (HCC); Insomnia; PSA elevation; BPH (benign prostatic hyperplasia); Restless legs syndrome; Hyperlipidemia, acquired; Peripheral polyneuropathy; Hiatal hernia; Arthritis of carpometacarpal (CMC) joint of left thumb; and Chronic anemia on their problem list.. Main reasons for visit/main concerns/chief complaint: Severe cough (Productive with yellowish and sometime black mucus. For about a week. Has gotten worse. Taken Coracedin and Advil sinus and decongestant and also used inhaler with no relief.) and Fatigue   ------------------------------------------------------------------------------------------------------------------------ AI-Extracted: Discussed the use of AI scribe software for clinical note transcription with the patient, who gave verbal consent to proceed.  History of Present Illness The patient, with a history of recurrent right-sided pneumonia due to reflux, presents with a week-long cough that has progressively worsened. The cough is productive, yielding yellowish and sometimes black sputum, but no blood. The patient also reports malaise, shortness of breath, and weakness. He has been experiencing body aches, but attributes this to physical work around the house. The patient has been taking quercetin, Advil, and an inhaler, but these have not provided relief.  The patient has not taken his usual dose of melasartan for hypertension on the day of the consultation. He also reports that he has not experienced any new reflux, which has previously been identified as the cause of his recurrent pneumonia. Despite this, the patient acknowledges that reflux could have occurred without his awareness, possibly during sleep or while coughing.  The patient has been on a new medication for reflux, Panto, which he believes has been effective, as he has gone almost two months without a flare. He has not yet seen his  gastroenterologist.  The patient  also reports nasal congestion and a previously sore right nostril. He denies any abdominal tenderness. The patient has a history of using a nebulizer, but the medication may be expired. He recalls a previous episode of pneumonia treated with a strong medication, possibly Augmentin, which was effective.  The patient has been prescribed Augmentin, probiotics, and steroids in the past for similar symptoms. He has also been given a shot of a steroid, possibly Decadron, which he found helpful. The patient is open to receiving another steroid injection to kick off his treatment.   Note that patient  has a past medical history of Allergy, Arthritis, Barrett's esophagus, Barrett's esophagus with dysplasia (07/24/2021), BPH (benign prostatic hyperplasia), Chronic lower back pain, Chronic pain (07/24/2021), Chronic rhinitis, Chronic sinusitis (11/11/2018), ED (erectile dysfunction) of organic origin, GERD (gastroesophageal reflux disease), Hemoptysis (11/11/2018), History of wheezing (11/11/2018), Hypertension (10/20/21), Neuromuscular disorder (HCC) (2011), Neuropathy (06/18/2016), Pneumonitis due to inhalation of food or vomitus (HCC) (07/30/2022), Thyroid disease, Tinnitus, and Urinary tract infectious disease (08/13/2022).  Problem list overviews that were updated at today's visit:No problems updated.  Med reconciliation: Current Outpatient Medications on File Prior to Visit  Medication Sig   acetaminophen (TYLENOL) 500 MG tablet Take by mouth as needed.   albuterol (VENTOLIN HFA) 108 (90 Base) MCG/ACT inhaler Inhale 2 puffs into the lungs every 6 (six) hours as needed for wheezing or shortness of breath.   alfuzosin (UROXATRAL) 10 MG 24 hr tablet Take 10 mg by mouth as needed.   Budeson-Glycopyrrol-Formoterol (BREZTRI AEROSPHERE) 160-9-4.8 MCG/ACT AERO Inhale 2 puffs into the lungs in the morning and at bedtime.   chlorthalidone (HYGROTON) 25 MG tablet Take 1 tablet (25 mg  total) by mouth daily.   Famotidine-Ca Carb-Mag Hydrox (PEPCID COMPLETE PO) Take by mouth daily.   HYDROcodone-acetaminophen (NORCO/VICODIN) 5-325 MG tablet Take 1 tablet by mouth every 6 (six) hours as needed for moderate pain (pain score 4-6) or severe pain (pain score 7-10) (do not drive while taking, do not mix with other sedatives such as etoh.).   ibuprofen (ADVIL) 200 MG tablet Take 200 mg by mouth 4 (four) times daily.   losartan (COZAAR) 50 MG tablet Take 50 mg by mouth daily as needed (BP>120).   montelukast (SINGULAIR) 10 MG tablet Take 10 mg by mouth as needed.   pantoprazole (PROTONIX) 40 MG tablet Take 1 tablet (40 mg total) by mouth daily.   pregabalin (LYRICA) 100 MG capsule Take 1 capsule (100 mg total) by mouth 2 (two) times daily.   Saw Palmetto, Serenoa repens, (SAW PALMETTO PO) Take 1 capsule by mouth daily. 450 MG   sildenafil (VIAGRA) 100 MG tablet    No current facility-administered medications on file prior to visit.   Medications Discontinued During This Encounter  Medication Reason   predniSONE (DELTASONE) 10 MG tablet Reorder   ipratropium-albuterol (DUONEB) 0.5-2.5 (3) MG/3ML SOLN Reorder     Objective   Physical Exam     04/26/2023   11:35 AM 04/26/2023   11:26 AM 04/15/2023   10:43 AM  Vitals with BMI  Height  5\' 6"    Weight  171 lbs 3 oz   BMI  27.65   Systolic 146 155 782  Diastolic 78 78 62  Pulse  89    Wt Readings from Last 10 Encounters:  04/26/23 171 lb 3.2 oz (77.7 kg)  04/15/23 173 lb 9.6 oz (78.7 kg)  03/15/23 173 lb 6 oz (78.6 kg)  02/19/23 172 lb (78 kg)  01/30/23 167 lb  3.2 oz (75.8 kg)  01/28/23 169 lb 12.8 oz (77 kg)  01/18/23 173 lb (78.5 kg)  10/09/22 168 lb (76.2 kg)  09/11/22 168 lb 12.8 oz (76.6 kg)  08/22/22 168 lb 3.2 oz (76.3 kg)   Vital signs reviewed.  Nursing notes reviewed. Weight trend reviewed. Abnormalities and Problem-Specific physical exam findings: HEENT: Throat normal. Nasal congestion present. CHEST: Right  lung sounds consistent with pneumonia, left lung clear. Wheezing present. General Appearance:  No acute distress appreciable.   Well-groomed, healthy-appearing male.  Well proportioned with no abnormal fat distribution.  Good muscle tone. Pulmonary:  Normal work of breathing at rest, no respiratory distress apparent. SpO2: 96 %  Musculoskeletal: All extremities are intact.  Neurological:  Awake, alert, oriented, and engaged.  No obvious focal neurological deficits or cognitive impairments.  Sensorium seems unclouded.   Speech is clear and coherent with logical content. Psychiatric:  Appropriate mood, pleasant and cooperative demeanor, thoughtful and engaged during the exam  Results            No results found for any visits on 04/26/23.  Lab on 04/18/2023  Component Date Value   Vitamin B-12 04/18/2023 844    Folate 04/18/2023 17.0    WBC 04/18/2023 6.7    RBC 04/18/2023 4.29    Platelets 04/18/2023 207.0    Hemoglobin 04/18/2023 12.0 (L)    HCT 04/18/2023 37.1 (L)    MCV 04/18/2023 86.5    MCHC 04/18/2023 32.3    RDW 04/18/2023 15.0    Sodium 04/18/2023 142    Potassium 04/18/2023 4.4    Chloride 04/18/2023 106    CO2 04/18/2023 29    Glucose, Bld 04/18/2023 119 (H)    BUN 04/18/2023 26 (H)    Creatinine, Ser 04/18/2023 1.05    Total Bilirubin 04/18/2023 1.1    Alkaline Phosphatase 04/18/2023 49    AST 04/18/2023 19    ALT 04/18/2023 16    Total Protein 04/18/2023 6.1    Albumin 04/18/2023 3.9    GFR 04/18/2023 68.77    Calcium 04/18/2023 9.1    Cholesterol 04/18/2023 124    Triglycerides 04/18/2023 92.0    HDL 04/18/2023 45.30    VLDL 04/18/2023 18.4    LDL Cholesterol 04/18/2023 60    Total CHOL/HDL Ratio 04/18/2023 3    NonHDL 04/18/2023 78.68    Magnesium 04/18/2023 2.0   Office Visit on 02/19/2023  Component Date Value   SARS Coronavirus 2 Ag 02/19/2023 Negative   Office Visit on 01/18/2023  Component Date Value   SARS Coronavirus 2 Ag 01/18/2023 Negative    Lab on 08/02/2022  Component Date Value   Magnesium 08/02/2022 1.9    Cholesterol 08/02/2022 129    Triglycerides 08/02/2022 77.0    HDL 08/02/2022 48.30    VLDL 08/02/2022 15.4    LDL Cholesterol 08/02/2022 65    Total CHOL/HDL Ratio 08/02/2022 3    NonHDL 08/02/2022 80.50    Sodium 08/02/2022 142    Potassium 08/02/2022 4.9    Chloride 08/02/2022 107    CO2 08/02/2022 27    Glucose, Bld 08/02/2022 113 (H)    BUN 08/02/2022 26 (H)    Creatinine, Ser 08/02/2022 1.04    Total Bilirubin 08/02/2022 1.0    Alkaline Phosphatase 08/02/2022 56    AST 08/02/2022 15    ALT 08/02/2022 12    Total Protein 08/02/2022 6.3    Albumin 08/02/2022 4.0    GFR 08/02/2022 69.91    Calcium 08/02/2022 9.4  WBC 08/02/2022 8.4    RBC 08/02/2022 4.40    Hemoglobin 08/02/2022 12.0 (L)    HCT 08/02/2022 37.2 (L)    MCV 08/02/2022 84.5    MCHC 08/02/2022 32.2    RDW 08/02/2022 17.5 (H)    Platelets 08/02/2022 220.0    Neutrophils Relative % 08/02/2022 64.0    Lymphocytes Relative 08/02/2022 22.1    Monocytes Relative 08/02/2022 8.0    Eosinophils Relative 08/02/2022 5.1 (H)    Basophils Relative 08/02/2022 0.8    Neutro Abs 08/02/2022 5.3    Lymphs Abs 08/02/2022 1.8    Monocytes Absolute 08/02/2022 0.7    Eosinophils Absolute 08/02/2022 0.4    Basophils Absolute 08/02/2022 0.1    Vitamin B-12 08/02/2022 446    Folate 08/02/2022 11.2    No image results found.   No results found.  DG Chest 2 View  Result Date: 02/01/2023 CLINICAL DATA:  Productive cough x2 weeks. EXAM: CHEST - 2 VIEW COMPARISON:  None Available. FINDINGS: The heart size and mediastinal contours are within normal limits. Mild, diffuse, chronic appearing increased lung markings are seen. Mild biapical pleural thickening and mild biapical atelectasis is also noted. Numerous radiopaque surgical clips are seen throughout the mediastinum. No pleural effusion or pneumothorax is identified. A stable moderate sized hiatal hernia is  present. The visualized skeletal structures are unremarkable. IMPRESSION: 1. Chronic appearing increased lung markings without evidence of acute or active cardiopulmonary disease. 2. Moderate sized hiatal hernia. Electronically Signed   By: Aram Candela M.D.   On: 02/01/2023 20:11         Additional Info: This encounter employed real-time, collaborative documentation. The patient actively reviewed and updated their medical record on a shared screen, ensuring transparency and facilitating joint problem-solving for the problem list, overview, and plan. This approach promotes accurate, informed care. The treatment plan was discussed and reviewed in detail, including medication safety, potential side effects, and all patient questions. We confirmed understanding and comfort with the plan. Follow-up instructions were established, including contacting the office for any concerns, returning if symptoms worsen, persist, or new symptoms develop, and precautions for potential emergency department visits.

## 2023-04-26 NOTE — Patient Instructions (Signed)
VISIT SUMMARY:  During today's visit, we addressed your week-long productive cough, shortness of breath, and general malaise. We discussed your history of recurrent right-sided pneumonia due to reflux, and your current symptoms, including nasal congestion and a sore nostril. We also reviewed your medication regimen and noted that you missed your usual dose of melasartan for hypertension today. We have developed a comprehensive treatment plan to address your symptoms and underlying conditions.  YOUR PLAN:  -ASPIRATION PNEUMONIA: Aspiration pneumonia is a lung infection that occurs when food, liquid, or vomit is inhaled into the lungs. It is often caused by gastroesophageal reflux disease (GERD). We will treat this with Augmentin, a steroid injection of methylprednisolone acetate 80 mg IM, prednisone, and probiotics. We will also order a nebulizer solution to help with your breathing.  -CHRONIC OBSTRUCTIVE PULMONARY DISEASE (COPD) FLARE: A COPD flare is a sudden worsening of COPD symptoms, often triggered by an infection. We will treat this with the same steroid injection of methylprednisolone acetate 80 mg IM, prednisone, and a nebulizer solution to reduce inflammation and improve your breathing.  -GASTROESOPHAGEAL REFLUX DISEASE (GERD): GERD is a condition where stomach acid frequently flows back into the esophagus, causing irritation. This can lead to aspiration pneumonia. We will continue your current medication, pantoprazole, to manage your reflux and prevent further complications.  -SINUSITIS: Sinusitis is an inflammation or swelling of the tissue lining the sinuses, which can cause nasal congestion and soreness. We will monitor your symptoms and consider further evaluation if they persist.  -HYPERTENSION: Hypertension, or high blood pressure, can lead to serious health issues like stroke and heart attack if not controlled. Please resume taking your melasartan as prescribed to manage your blood  pressure effectively.  INSTRUCTIONS:  Please schedule a follow-up appointment with your gastroenterologist. Continue taking your medications as prescribed and monitor your symptoms. If you experience any worsening of symptoms or new issues, contact our office immediately.

## 2023-04-26 NOTE — Assessment & Plan Note (Signed)
Gastroesophageal Reflux Disease (GERD)   His GERD is contributing to recurrent aspiration pneumonia. He reports improvement with pantoprazole. We discussed the risks of untreated GERD, including esophagitis and aspiration pneumonia, and the benefits of treatment, such as reduced reflux and prevention of pneumonia. Treatment alternatives include different proton pump inhibitors or lifestyle modifications. We will continue pantoprazole.

## 2023-05-20 ENCOUNTER — Encounter: Payer: Self-pay | Admitting: Internal Medicine

## 2023-05-22 ENCOUNTER — Other Ambulatory Visit: Payer: Self-pay

## 2023-05-22 ENCOUNTER — Other Ambulatory Visit: Payer: Self-pay | Admitting: Internal Medicine

## 2023-05-22 MED ORDER — ALFUZOSIN HCL ER 10 MG PO TB24: 10.0000 mg | ORAL_TABLET | ORAL | 3 refills | Status: DC | PRN

## 2023-05-22 MED ORDER — LOSARTAN POTASSIUM 50 MG PO TABS: 50.0000 mg | ORAL_TABLET | Freq: Every day | ORAL | 3 refills | Status: DC | PRN

## 2023-05-22 NOTE — Telephone Encounter (Signed)
Sent refills to pharmacy on file.

## 2023-06-03 DIAGNOSIS — K2270 Barrett's esophagus without dysplasia: Secondary | ICD-10-CM | POA: Diagnosis not present

## 2023-06-03 DIAGNOSIS — J690 Pneumonitis due to inhalation of food and vomit: Secondary | ICD-10-CM | POA: Diagnosis not present

## 2023-06-03 DIAGNOSIS — K219 Gastro-esophageal reflux disease without esophagitis: Secondary | ICD-10-CM | POA: Diagnosis not present

## 2023-06-21 ENCOUNTER — Ambulatory Visit
Admission: EM | Admit: 2023-06-21 | Discharge: 2023-06-21 | Disposition: A | Discharge status: Home / Self Care | Payer: Medicare Other | Attending: Family Medicine | Admitting: Family Medicine

## 2023-06-21 ENCOUNTER — Telehealth: Payer: Self-pay | Admitting: Internal Medicine

## 2023-06-21 DIAGNOSIS — I10 Essential (primary) hypertension: Secondary | ICD-10-CM | POA: Diagnosis not present

## 2023-06-21 DIAGNOSIS — J209 Acute bronchitis, unspecified: Secondary | ICD-10-CM | POA: Diagnosis not present

## 2023-06-21 MED ORDER — PROMETHAZINE-DM 6.25-15 MG/5ML PO SYRP: 5.0000 mL | ORAL_SOLUTION | Freq: Three times a day (TID) | ORAL | 0 refills | Status: DC | PRN

## 2023-06-21 MED ORDER — PREDNISONE 20 MG PO TABS: ORAL_TABLET | ORAL | 0 refills | Status: DC

## 2023-06-21 NOTE — Telephone Encounter (Signed)
 Copied from CRM [id number]. Topic: Clinical - Medical Advice >> Jun 21, 2023  8:07 AM Curlee DEL wrote: Reason for CRM: Patient is experiencing chest congestion, fatigue, body aches, cough - patient mentioned that normally the office is able to prescribe some steroids.  Patient declined acute appointment for Monday - no provider available today. Please call patient with additional guidance, they don't wish this condition to get worse.

## 2023-06-21 NOTE — ED Provider Notes (Signed)
 [facility]  Note:  This document was prepared using Conservation officer, historic buildings and may include unintentional dictation errors.  MRN: [id number] DOB: 1945/12/27  Subjective:   Jordan [name] is a 78 y.o. male presenting for 5-day history of acute onset persistent productive cough, chest congestion.  Reports that generally he is not having any coughing fits unless it is to get out mucus from his lungs.  Has a history of bronchitis and usually does very well with prednisone .  Has had bouts of pneumonia but feels that this bronchitis episode is different.   No current facility-administered medications for this encounter.  Current Outpatient Medications:    acetaminophen  (TYLENOL ) 500 MG tablet, Take by mouth as needed., Disp: , Rfl:    albuterol  (VENTOLIN  HFA) 108 (90 Base) MCG/ACT inhaler, Inhale 2 puffs into the lungs every 6 (six) hours as needed for wheezing or shortness of breath., Disp: 1 each, Rfl: 1   alfuzosin  (UROXATRAL ) 10 MG 24 hr tablet, TAKE 1 TABLET BY MOUTH AS NEEDED, Disp: 90 tablet, Rfl: 3   amoxicillin -clavulanate (AUGMENTIN ) 875-125 MG tablet, Take 1 tablet by mouth 2 (two) times daily., Disp: 20 tablet, Rfl: 0   Budeson-Glycopyrrol-Formoterol  (BREZTRI  AEROSPHERE) 160-9-4.8 MCG/ACT AERO, Inhale 2 puffs into the lungs in the morning and at bedtime., Disp: 1 each, Rfl:    chlorthalidone  (HYGROTON ) 25 MG tablet, Take 1 tablet (25 mg total) by mouth daily., Disp: 90 tablet, Rfl: 0   Famotidine-Ca Carb-Mag Hydrox (PEPCID COMPLETE PO), Take by mouth daily., Disp: , Rfl:    HYDROcodone -acetaminophen  (NORCO/VICODIN) 5-325 MG tablet, Take 1 tablet by mouth every 6 (six) hours as needed for moderate pain (pain score 4-6) or severe pain (pain score 7-10) (do not drive while taking, do not mix with other sedatives such as etoh.)., Disp: 45 tablet, Rfl: 0   ibuprofen (ADVIL) 200 MG tablet, Take 200 mg by mouth 4 (four) times daily., Disp: , Rfl:     ipratropium-albuterol  (DUONEB) 0.5-2.5 (3) MG/3ML SOLN, Take 3 mLs by nebulization every 6 (six) hours as needed., Disp: 270 mL, Rfl: 1   losartan  (COZAAR ) 50 MG tablet, Take 1 tablet (50 mg total) by mouth daily as needed (BP>120)., Disp: 90 tablet, Rfl: 3   montelukast (SINGULAIR) 10 MG tablet, Take 10 mg by mouth as needed., Disp: , Rfl:    pantoprazole  (PROTONIX ) 40 MG tablet, Take 1 tablet (40 mg total) by mouth daily., Disp: 30 tablet, Rfl: 5   predniSONE  (DELTASONE ) 10 MG tablet, Take with breakfast:  6 tab day 1, 5 tab day 2-3, 4 tab day 4, 3 tab day 5-7, Disp: 29 tablet, Rfl: 0   pregabalin  (LYRICA ) 100 MG capsule, Take 1 capsule (100 mg total) by mouth 2 (two) times daily., Disp: 60 capsule, Rfl: 2   Probiotic Product (PROBIOTIC DAILY) CAPS, Take 1 capsule by mouth daily., Disp: 30 capsule, Rfl: 2   Saw Palmetto, Serenoa repens, (SAW PALMETTO PO), Take 1 capsule by mouth daily. 450 MG, Disp: , Rfl:    sildenafil (VIAGRA) 100 MG tablet, , Disp: , Rfl:    Allergies  Allergen Reactions   Latex    Adhesive [Tape] Rash    Past Medical History:  Diagnosis Date   Allergy    Arthritis    back, shoulder- both    Barrett's esophagus    Barrett's esophagus with dysplasia 07/24/2021   BPH (benign prostatic hyperplasia)    Chronic lower back pain    Herniated disc at  L4   Chronic pain 07/24/2021   Chronic rhinitis    Chronic sinusitis 11/11/2018   ED (erectile dysfunction) of organic origin    GERD (gastroesophageal reflux disease)    Barrett's Esophagus, treated /w esophagectomy - 1998, continues toi have reflux & uses Zantac on a regular basis    Hemoptysis 11/11/2018   History of wheezing 11/11/2018   Hypertension 10/20/21   Neuromuscular disorder (HCC) 2011   Tingling feet   Neuropathy 06/18/2016   Pneumonitis due to inhalation of food or vomitus (HCC) 07/30/2022   Thyroid  disease    Tinnitus    hearing loss in both ears    Urinary tract infectious disease 08/13/2022      Past Surgical History:  Procedure Laterality Date   CATARACT EXTRACTION Bilateral 2019   Dr. Lelon   ESOPHAGECTOMY     1998- at [facility], /w epidural & sedation & pain management /w continued  epidural    EYE SURGERY  2018   Cataracts   HERNIA REPAIR     LUMBAR LAMINECTOMY/DECOMPRESSION MICRODISCECTOMY  03/22/2012   Procedure: LUMBAR LAMINECTOMY/DECOMPRESSION MICRODISCECTOMY 1 LEVEL;  Surgeon: Alm GORMAN Molt, MD;  Location: [facility];  Service: Neurosurgery;  Laterality: Right;  right lumbar four-five extra-foraminal microdiscectomy   SPINE SURGERY  2013   Ruptured disc L4    Family History  Problem Relation Age of Onset   Hypertension Mother    Alcohol abuse Mother    Arthritis Mother    Cancer Mother    Hypertension Father    Cancer Father    Heart attack Father    Alcohol abuse Father    Stroke Father    Allergic rhinitis Neg Hx    Asthma Neg Hx    Eczema Neg Hx    Urticaria Neg Hx     Social History   Tobacco Use   Smoking status: Never   Smokeless tobacco: Never   Tobacco comments:    Never smoked  Vaping Use   Vaping status: Never Used  Substance Use Topics   Alcohol use: Not Currently   Drug use: No    ROS   Objective:   Vitals: BP (!) 170/91 (BP Location: Left Arm)   Pulse 87   Temp 98.3 F (36.8 C) (Oral)   Resp 20   SpO2 94%   BP Readings from Last 3 Encounters:  06/21/23 (!) 170/91  04/26/23 (!) 146/78  04/15/23 (!) 142/62   Physical Exam Constitutional:      General: He is not in acute distress.    Appearance: Normal appearance. He is well-developed. He is not ill-appearing, toxic-appearing or diaphoretic.  HENT:     Head: Normocephalic and atraumatic.     Right Ear: External ear normal.     Left Ear: External ear normal.     Nose: Nose normal.     Mouth/Throat:     Mouth: Mucous membranes are moist.  Eyes:     General: No scleral icterus.       Right eye: No discharge.        Left eye: No discharge.     Extraocular  Movements: Extraocular movements intact.  Cardiovascular:     Rate and Rhythm: Normal rate and regular rhythm.     Heart sounds: Normal heart sounds. No murmur heard.    No friction rub. No gallop.  Pulmonary:     Effort: Pulmonary effort is normal. No respiratory distress.     Breath sounds: No stridor. Rhonchi (mid-lower lung  fields) present. No wheezing or rales.  Neurological:     Mental Status: He is alert and oriented to person, place, and time.  Psychiatric:        Mood and Affect: Mood normal.        Behavior: Behavior normal.        Thought Content: Thought content normal.     Assessment and Plan :   PDMP not reviewed this encounter.  1. Acute bronchitis, unspecified organism   2. Essential hypertension    Recommend starting an oral prednisone  course, using supportive care.  Will defer imaging for now.  Patient was in agreement.  Counseled patient on potential for adverse effects with medications prescribed/recommended today, ER and return-to-clinic precautions discussed, patient verbalized understanding.    Christopher Savannah, [place] 06/21/23 1327

## 2023-06-21 NOTE — ED Triage Notes (Signed)
 Pt c/p prod cough, chest congestion x 5 days-denies fever-using albuterol inhaler and OTC med-NAD-steady gait

## 2023-06-21 NOTE — Telephone Encounter (Signed)
 I have spoke with patient in regard.  I have informed patient that he does need an office visit and maybe other orders such as xray.  I did not advise him going into the weekend with the current symptoms he has.  I offered to have patient triaged.  Patient declined.   [facility] offices are full today.  I have advised patient to seek treat at [facility].  Patient states he will go to the UC on [address] today.

## 2023-06-24 ENCOUNTER — Ambulatory Visit (INDEPENDENT_AMBULATORY_CARE_PROVIDER_SITE_OTHER): Payer: Medicare Other | Admitting: Physician Assistant

## 2023-06-24 ENCOUNTER — Encounter: Payer: Self-pay | Admitting: Physician Assistant

## 2023-06-24 ENCOUNTER — Telehealth: Payer: Self-pay

## 2023-06-24 VITALS — BP 198/100 | HR 105 | Temp 97.5°F | Ht 66.0 in | Wt 172.2 lb

## 2023-06-24 DIAGNOSIS — I10 Essential (primary) hypertension: Secondary | ICD-10-CM | POA: Diagnosis not present

## 2023-06-24 DIAGNOSIS — J209 Acute bronchitis, unspecified: Secondary | ICD-10-CM | POA: Diagnosis not present

## 2023-06-24 DIAGNOSIS — J42 Unspecified chronic bronchitis: Secondary | ICD-10-CM

## 2023-06-24 MED ORDER — AMOXICILLIN-POT CLAVULANATE 875-125 MG PO TABS
1.0000 | ORAL_TABLET | Freq: Two times a day (BID) | ORAL | 0 refills | Status: DC
Start: 2023-06-24 — End: 2023-09-25

## 2023-06-24 MED ORDER — IPRATROPIUM-ALBUTEROL 0.5-2.5 (3) MG/3ML IN SOLN
3.0000 mL | Freq: Four times a day (QID) | RESPIRATORY_TRACT | 1 refills | Status: AC | PRN
Start: 2023-06-24 — End: ?

## 2023-06-24 MED ORDER — AZITHROMYCIN 250 MG PO TABS
ORAL_TABLET | ORAL | 0 refills | Status: AC
Start: 2023-06-24 — End: 2023-06-29

## 2023-06-24 MED ORDER — PREDNISONE 20 MG PO TABS: 20.0000 mg | ORAL_TABLET | Freq: Two times a day (BID) | ORAL | 0 refills | Status: DC

## 2023-06-24 NOTE — Progress Notes (Signed)
 Jordan [name] is a 78 y.o. male here for a new problem.  History of Present Illness:   Chief Complaint  Patient presents with   Bronchitis    Follow up, productive cough, runny nose    HPI  Acute Concerns:  Cough/Bronchitis  He complains today of a cough that has persisted for the past few days.  He was seen in the ED for this and was treated with prednisone .  He states that his symptoms are worse since starting the prednisone .  He is also experiencing accompanying sputum production, sinus pain, wheezing, SOB, and nasal congestion.  He is wondering if it has progressed to pneumonia as he has a history of it.  He states that he has been using his albuterol  every day since his last bronchitis episodes but states that he doesn't feel much relief with it.  He has also been using a nebulizer treatment which seems to be more effective than the inhalers.    His wife is also experiencing a cough.  He denies any sore throat, fever, or chills.  His interested in starting antibiotics today.   Chronic Concerns: HTN  His blood pressure reading at office today is 198/100.  He states his BP is high today as he forgot to take his Losartan  50 mg daily this morning. He is not taking his chlorthalidone  Denies any chest pain, changes in vision, SOB, leg swelling.   Past Medical History:  Diagnosis Date   Allergy    Arthritis    back, shoulder- both    Barrett's esophagus    Barrett's esophagus with dysplasia 07/24/2021   BPH (benign prostatic hyperplasia)    Chronic lower back pain    Herniated disc at L4   Chronic pain 07/24/2021   Chronic rhinitis    Chronic sinusitis 11/11/2018   ED (erectile dysfunction) of organic origin    GERD (gastroesophageal reflux disease)    Barrett's Esophagus, treated /w esophagectomy - 1998, continues toi have reflux & uses Zantac on a regular basis    Hemoptysis 11/11/2018   History of wheezing 11/11/2018   Hypertension 10/20/21   Neuromuscular disorder  (HCC) 2011   Tingling feet   Neuropathy 06/18/2016   Pneumonitis due to inhalation of food or vomitus (HCC) 07/30/2022   Thyroid  disease    Tinnitus    hearing loss in both ears    Urinary tract infectious disease 08/13/2022     Social History   Tobacco Use   Smoking status: Never   Smokeless tobacco: Never   Tobacco comments:    Never smoked  Vaping Use   Vaping status: Never Used  Substance Use Topics   Alcohol use: Not Currently   Drug use: No    Past Surgical History:  Procedure Laterality Date   CATARACT EXTRACTION Bilateral 2019   Dr. Lelon   ESOPHAGECTOMY     1998- at [facility], /w epidural & sedation & pain management /w continued  epidural    EYE SURGERY  2018   Cataracts   HERNIA REPAIR     LUMBAR LAMINECTOMY/DECOMPRESSION MICRODISCECTOMY  03/22/2012   Procedure: LUMBAR LAMINECTOMY/DECOMPRESSION MICRODISCECTOMY 1 LEVEL;  Surgeon: Alm GORMAN Molt, MD;  Location: [facility];  Service: Neurosurgery;  Laterality: Right;  right lumbar four-five extra-foraminal microdiscectomy   SPINE SURGERY  2013   Ruptured disc L4    Family History  Problem Relation Age of Onset   Hypertension Mother    Alcohol abuse Mother    Arthritis Mother  Cancer Mother    Hypertension Father    Cancer Father    Heart attack Father    Alcohol abuse Father    Stroke Father    Allergic rhinitis Neg Hx    Asthma Neg Hx    Eczema Neg Hx    Urticaria Neg Hx     Allergies  Allergen Reactions   Latex    Adhesive [Tape] Rash    Current Medications:   Current Outpatient Medications:    albuterol  (VENTOLIN  HFA) 108 (90 Base) MCG/ACT inhaler, Inhale 2 puffs into the lungs every 6 (six) hours as needed for wheezing or shortness of breath., Disp: 1 each, Rfl: 1   alfuzosin  (UROXATRAL ) 10 MG 24 hr tablet, TAKE 1 TABLET BY MOUTH AS NEEDED, Disp: 90 tablet, Rfl: 3   amoxicillin -clavulanate (AUGMENTIN ) 875-125 MG tablet, Take 1 tablet by mouth 2 (two) times daily., Disp: 14 tablet, Rfl: 0    azithromycin  (ZITHROMAX ) 250 MG tablet, Take 2 tablets on day 1, then 1 tablet daily on days 2 through 5, Disp: 6 tablet, Rfl: 0   Budeson-Glycopyrrol-Formoterol  (BREZTRI  AEROSPHERE) 160-9-4.8 MCG/ACT AERO, Inhale 2 puffs into the lungs in the morning and at bedtime., Disp: 1 each, Rfl:    Famotidine-Ca Carb-Mag Hydrox (PEPCID COMPLETE PO), Take by mouth daily., Disp: , Rfl:    HYDROcodone -acetaminophen  (NORCO/VICODIN) 5-325 MG tablet, Take 1 tablet by mouth every 6 (six) hours as needed for moderate pain (pain score 4-6) or severe pain (pain score 7-10) (do not drive while taking, do not mix with other sedatives such as etoh.)., Disp: 45 tablet, Rfl: 0   ibuprofen (ADVIL) 200 MG tablet, Take 200 mg by mouth 4 (four) times daily., Disp: , Rfl:    losartan  (COZAAR ) 50 MG tablet, Take 1 tablet (50 mg total) by mouth daily as needed (BP>120)., Disp: 90 tablet, Rfl: 3   pantoprazole  (PROTONIX ) 40 MG tablet, Take 1 tablet (40 mg total) by mouth daily., Disp: 30 tablet, Rfl: 5   predniSONE  (DELTASONE ) 20 MG tablet, Take 1 tablet (20 mg total) by mouth 2 (two) times daily with a meal., Disp: 10 tablet, Rfl: 0   promethazine -dextromethorphan (PROMETHAZINE -DM) 6.25-15 MG/5ML syrup, Take 5 mLs by mouth 3 (three) times daily as needed for cough., Disp: 200 mL, Rfl: 0   Saw Palmetto, Serenoa repens, (SAW PALMETTO PO), Take 1 capsule by mouth daily. 450 MG, Disp: , Rfl:    sildenafil (VIAGRA) 100 MG tablet, , Disp: , Rfl:    acetaminophen  (TYLENOL ) 500 MG tablet, Take by mouth as needed. (Patient not taking: Reported on 06/24/2023), Disp: , Rfl:    chlorthalidone  (HYGROTON ) 25 MG tablet, Take 1 tablet (25 mg total) by mouth daily. (Patient not taking: Reported on 06/24/2023), Disp: 90 tablet, Rfl: 0   ipratropium-albuterol  (DUONEB) 0.5-2.5 (3) MG/3ML SOLN, Take 3 mLs by nebulization every 6 (six) hours as needed., Disp: 270 mL, Rfl: 1   montelukast (SINGULAIR) 10 MG tablet, Take 10 mg by mouth as needed. (Patient not  taking: Reported on 06/24/2023), Disp: , Rfl:    pregabalin  (LYRICA ) 100 MG capsule, Take 1 capsule (100 mg total) by mouth 2 (two) times daily. (Patient not taking: Reported on 06/24/2023), Disp: 60 capsule, Rfl: 2   Probiotic Product (PROBIOTIC DAILY) CAPS, Take 1 capsule by mouth daily. (Patient not taking: Reported on 06/24/2023), Disp: 30 capsule, Rfl: 2   Review of Systems:   Review of Systems  Constitutional:  Negative for chills and fever.  HENT:  Positive for  congestion and sinus pain. Negative for sore throat.   Eyes:  Negative for double vision and pain.  Respiratory:  Positive for cough, sputum production, shortness of breath and wheezing.   Cardiovascular:  Negative for leg swelling.    Vitals:   Vitals:   06/24/23 1310  BP: (!) 198/100  Pulse: (!) 105  Temp: (!) 97.5 F (36.4 C)  SpO2: 95%  Weight: 172 lb 3.2 oz (78.1 kg)  Height: 5' 6 (1.676 m)     Body mass index is 27.79 kg/m.  Physical Exam:   Physical Exam Vitals and nursing note reviewed.  Constitutional:      General: He is not in acute distress.    Appearance: He is well-developed. He is not ill-appearing or toxic-appearing.  Cardiovascular:     Rate and Rhythm: Normal rate and regular rhythm.     Pulses: Normal pulses.     Heart sounds: Normal heart sounds, S1 normal and S2 normal.  Pulmonary:     Effort: Pulmonary effort is normal.     Breath sounds: Examination of the right-upper field reveals wheezing and rhonchi. Examination of the right-middle field reveals rhonchi. Examination of the right-lower field reveals rhonchi. Wheezing and rhonchi present.  Skin:    General: Skin is warm and dry.  Neurological:     Mental Status: He is alert.     GCS: GCS eye subscore is 4. GCS verbal subscore is 5. GCS motor subscore is 6.  Psychiatric:        Speech: Speech normal.        Behavior: Behavior normal. Behavior is cooperative.     Assessment and Plan:   Chronic bronchitis with acute exacerbation  (HCC) No red flags on exam.   Will initiate Augmentin  and azithromycin  per orders.  Will extend oral prednisone  script for 5 more days. Discussed obtaining CXR however we decided to start oral antibiotic(s) treatment for a few days and then if still no improvement, will obtain xray Discussed taking medications as prescribed.  Reviewed return precautions including new or worsening fever, SOB, new or worsening cough or other concerns.  Push fluids and rest.  I recommend that patient follow-up if symptoms worsen or persist despite treatment x 7-10 days, sooner if needed.   Hypertension, unspecified type Above goal today No evidence of end-organ damage on my exam Recommend patient monitor home blood pressure at least a few times weekly  Advised patient to go home and take his losartan  50 mg daily Advised as follows: KEEP AN EYE ON YOUR BLOOD PRESSURE If no improvement of any worsening, please call us  as soon as possible and we will advise vurhter If any chest pain or stoke-like symptom(s), please go to the ER  If home monitoring shows consistent elevation, or any symptom(s) develop, recommend reach out to us  for further advice on next steps     I,Safa M Kadhim,acting as a scribe for [employer], PA.,have documented all relevant documentation on the behalf of Lucie Buttner, PA,as directed by  Lucie Buttner, PA while in the presence of Lucie Buttner, [place].   I, Lucie Buttner, [place], have reviewed all documentation for this visit. The documentation on 06/24/23 for the exam, diagnosis, procedures, and orders are all accurate and complete.   Lucie Buttner, PA-C

## 2023-06-24 NOTE — Patient Instructions (Addendum)
 It was great to see you!  Start Augmentin  and azithromycin  antibiotic(s) Message me or call me if no improvement in next few days and we will order chest xray  Continue steroids and start new steroids when current dose runs out  I've refilled your nebulizer solution  KEEP AN EYE ON YOUR BLOOD PRESSURE If no improvement of any worsening, please call us  as soon as possible and we will advise vurhter If any chest pain or stoke-like symptom(s), please go to the ER  Take care,  Shantale Holtmeyer PA-C

## 2023-06-24 NOTE — Telephone Encounter (Signed)
 I called patient to follow up with his blood pressure and it was checked at 3:45pm and was 194/97 and patient said that he took another Losartan  at 3:15pm. He said that he does not usually take 2 tablets like this in one day. I asked patient if he was taking Chlorthalidone  and he said that he is not.

## 2023-06-24 NOTE — Telephone Encounter (Signed)
 I called and spoke with Jordan [name] and notified him of below message. I got the front office to schedule Jordan [name] for 8am on 06/27/2023 with Dr,. Jon Billings with a follow up on elevated blood pressure and Jordan [name] aware of appointment.

## 2023-06-24 NOTE — Telephone Encounter (Signed)
 I called patient to follow up on his blood pressure. Mr. Jordan [name] said that he took his Losartan  at 2:15 pm and that he took his BP at 2:45 and it was 193/100. He said that he is going to lay in bed and rest even though he said that he feels ok. I told patient that I will call him back later to get another reading of his blood pressure.

## 2023-06-27 ENCOUNTER — Ambulatory Visit (INDEPENDENT_AMBULATORY_CARE_PROVIDER_SITE_OTHER): Payer: Medicare Other | Admitting: Internal Medicine

## 2023-06-27 ENCOUNTER — Encounter: Payer: Self-pay | Admitting: Internal Medicine

## 2023-06-27 VITALS — BP 157/82 | HR 108 | Temp 98.6°F | Resp 18 | Ht 66.0 in | Wt 174.2 lb

## 2023-06-27 DIAGNOSIS — I10 Essential (primary) hypertension: Secondary | ICD-10-CM | POA: Diagnosis not present

## 2023-06-27 DIAGNOSIS — J411 Mucopurulent chronic bronchitis: Secondary | ICD-10-CM | POA: Diagnosis not present

## 2023-06-27 DIAGNOSIS — F419 Anxiety disorder, unspecified: Secondary | ICD-10-CM | POA: Diagnosis not present

## 2023-06-27 MED ORDER — SODIUM CHLORIDE 0.9 % IN NEBU: 3.0000 mL | INHALATION_SOLUTION | RESPIRATORY_TRACT | 3 refills | Status: AC | PRN

## 2023-06-27 MED ORDER — TELMISARTAN 40 MG PO TABS: 40.0000 mg | ORAL_TABLET | Freq: Every day | ORAL | 3 refills | Status: DC

## 2023-06-27 NOTE — Assessment & Plan Note (Signed)
 Chronic Bronchitis with GERD Chronic bronchitis, exacerbated by GERD, presents with a persistent, phlegmy cough and difficulty clearing secretions, currently managed with azithromycin , amoxicillin , and prednisone . Severe reflux post-esophagectomy causes recurrent bronchitis. We recommend an elevated sleeping position and avoiding reflux triggers. Each reflux event risks cumulative lung damage, complicating blood pressure management. We will continue the current regimen, prescribe saline nebulizer treatments, advise an elevated sleeping position, avoid reflux triggers, and order a chest x-ray if symptoms persist post-medication.

## 2023-06-27 NOTE — Telephone Encounter (Signed)
 Patient saw Dr. Jon Billings today for an OV concerning this.

## 2023-06-27 NOTE — Progress Notes (Signed)
 ==============================  [facility]: [phone]   -- Medical Office Visit --  Patient: Jordan [name]      Age: 78 y.o.       Sex:  male  Date:   06/27/2023 Today's Healthcare Provider: Bernardino KANDICE Cone, MD  ==============================   CHIEF COMPLAINT: Hypertension (Follow-up on elevated blood pressure/) and Bronchitis Blood pressure over 200 3 days ago, close follow up, has improved also flare of his recurrent reflux chronic mucopurulent bronchitis, also improving. Wants to change losartan   SUBJECTIVE: 78 y.o. male who has GERD (gastroesophageal reflux disease); Hypertension; Allergies; Back pain at L4-L5 level; Displacement of lumbar intervertebral disc without myelopathy; Dysphagia; Dizziness; Hypertensive retinopathy; Lower urinary tract symptoms due to benign prostatic hyperplasia; Mixed hyperlipidemia; Stricture of esophagus; Chronic bronchitis (HCC); H/O esophagectomy; Mechanical complication of esophagostomy (HCC); Insomnia; PSA elevation; BPH (benign prostatic hyperplasia); Restless legs syndrome; Hyperlipidemia, acquired; Peripheral polyneuropathy; Hiatal hernia; Arthritis of carpometacarpal (CMC) joint of left thumb; and Chronic anemia on their problem list.  History of Present Illness The patient, with a history of chronic bronchitis, hypertension, and esophageal surgery, presents with concerns of elevated blood pressure and persistent bronchitis. He has been monitoring his blood pressure at home, which had previously spiked to 190-200 systolic, but has since improved to around 150 systolic over the past three days. He attributes this improvement to self-adjustment of his losartan  dosage and management of his bronchitis.  The patient's bronchitis is reportedly triggered by reflux due to his surgically altered esophagus. He describes a productive cough with difficulty clearing phlegm, which has recently moved up into his lungs. Despite  this, he reports feeling better and breathing easier over the past day. He is currently on a regimen of azithromycin , amoxicillin , and prednisone  for his bronchitis.  The patient also reports a history of neuropathy, which has surprisingly improved over the past few weeks without any specific intervention. He denies any recent dizziness but does report feeling jittery and anxious, which he attributes to his current steroid use.  In terms of lifestyle, the patient has made several modifications to manage his reflux, including dietary changes and sleeping in an elevated position. He has ceased consumption of wine, beer, chocolate, and fried foods, and limits his coffee intake to one cup per day. Despite these changes, he continues to experience reflux events, which he believes contribute to his bronchitis and hypertension.    Past Medical History - High blood pressure - Bronchitis - Esophagectomy - Gastroesophageal reflux disease (GERD) - Dizziness - Neuropathy  Note that patient  has a past medical history of Allergy, Arthritis, Barrett's esophagus, Barrett's esophagus with dysplasia (07/24/2021), BPH (benign prostatic hyperplasia), Chronic lower back pain, Chronic pain (07/24/2021), Chronic rhinitis, Chronic sinusitis (11/11/2018), ED (erectile dysfunction) of organic origin, GERD (gastroesophageal reflux disease), Hemoptysis (11/11/2018), History of wheezing (11/11/2018), Hypertension (10/20/21), Neuromuscular disorder (HCC) (2011), Neuropathy (06/18/2016), Pneumonitis due to inhalation of food or vomitus (HCC) (07/30/2022), Thyroid  disease, Tinnitus, and Urinary tract infectious disease (08/13/2022).  Problem list overviews that were updated at today's visit:No problems updated.   Medications - Azithromycin  - Amoxicillin  - Losartan  - Prednisone  - Erythromycin - Saline nebulizer 3 times a day   Med reconciliation: Current Outpatient Medications on File Prior to Visit  Medication Sig    albuterol  (VENTOLIN  HFA) 108 (90 Base) MCG/ACT inhaler Inhale 2 puffs into the lungs every 6 (six) hours as needed for wheezing or shortness of breath.   alfuzosin  (UROXATRAL ) 10 MG 24 hr tablet TAKE  1 TABLET BY MOUTH AS NEEDED   amoxicillin -clavulanate (AUGMENTIN ) 875-125 MG tablet Take 1 tablet by mouth 2 (two) times daily.   azithromycin  (ZITHROMAX ) 250 MG tablet Take 2 tablets on day 1, then 1 tablet daily on days 2 through 5   Budeson-Glycopyrrol-Formoterol  (BREZTRI  AEROSPHERE) 160-9-4.8 MCG/ACT AERO Inhale 2 puffs into the lungs in the morning and at bedtime.   Famotidine-Ca Carb-Mag Hydrox (PEPCID COMPLETE PO) Take by mouth daily.   HYDROcodone -acetaminophen  (NORCO/VICODIN) 5-325 MG tablet Take 1 tablet by mouth every 6 (six) hours as needed for moderate pain (pain score 4-6) or severe pain (pain score 7-10) (do not drive while taking, do not mix with other sedatives such as etoh.).   ibuprofen (ADVIL) 200 MG tablet Take 200 mg by mouth 4 (four) times daily.   ipratropium-albuterol  (DUONEB) 0.5-2.5 (3) MG/3ML SOLN Take 3 mLs by nebulization every 6 (six) hours as needed.   losartan  (COZAAR ) 50 MG tablet Take 1 tablet (50 mg total) by mouth daily as needed (BP>120).   predniSONE  (DELTASONE ) 20 MG tablet Take 1 tablet (20 mg total) by mouth 2 (two) times daily with a meal.   promethazine -dextromethorphan (PROMETHAZINE -DM) 6.25-15 MG/5ML syrup Take 5 mLs by mouth 3 (three) times daily as needed for cough.   Saw Palmetto, Serenoa repens, (SAW PALMETTO PO) Take 1 capsule by mouth daily. 450 MG   sildenafil (VIAGRA) 100 MG tablet    acetaminophen  (TYLENOL ) 500 MG tablet Take by mouth as needed. (Patient not taking: Reported on 06/27/2023)   chlorthalidone  (HYGROTON ) 25 MG tablet Take 1 tablet (25 mg total) by mouth daily. (Patient not taking: Reported on 06/27/2023)   montelukast (SINGULAIR) 10 MG tablet Take 10 mg by mouth as needed. (Patient not taking: Reported on 06/27/2023)   pantoprazole  (PROTONIX )  40 MG tablet Take 1 tablet (40 mg total) by mouth daily. (Patient not taking: Reported on 06/27/2023)   pregabalin  (LYRICA ) 100 MG capsule Take 1 capsule (100 mg total) by mouth 2 (two) times daily. (Patient not taking: Reported on 06/27/2023)   Probiotic Product (PROBIOTIC DAILY) CAPS Take 1 capsule by mouth daily. (Patient not taking: Reported on 06/27/2023)   No current facility-administered medications on file prior to visit.  There are no discontinued medications.  Social History - Quit drinking beer - Quit drinking wine - Quit eating chocolate - Reduced fried food intake    Objective   Physical Exam     06/27/2023    7:50 AM 06/24/2023    1:10 PM 06/21/2023   12:59 PM  Vitals with BMI  Height 5' 6 5' 6   Weight 174 lbs 4 oz 172 lbs 3 oz   BMI 28.14 27.81   Systolic 157 198 829  Diastolic 82 100 91  Pulse 108 894 87   Wt Readings from Last 10 Encounters:  06/27/23 174 lb 4 oz (79 kg)  06/24/23 172 lb 3.2 oz (78.1 kg)  04/26/23 171 lb 3.2 oz (77.7 kg)  04/15/23 173 lb 9.6 oz (78.7 kg)  03/15/23 173 lb 6 oz (78.6 kg)  02/19/23 172 lb (78 kg)  01/30/23 167 lb 3.2 oz (75.8 kg)  01/28/23 169 lb 12.8 oz (77 kg)  01/18/23 173 lb (78.5 kg)  10/09/22 168 lb (76.2 kg)   Vital signs reviewed.  Nursing notes reviewed. Weight trend reviewed. Abnormalities and Problem-Specific physical exam findings:  phlegmy cough, wheezy.   General Appearance:  No acute distress appreciable.   Well-groomed, healthy-appearing male.  Well proportioned with no abnormal  fat distribution.  Good muscle tone. Pulmonary:  Normal work of breathing at rest, no respiratory distress apparent. SpO2: 93 %  Musculoskeletal: All extremities are intact.  Neurological:  Awake, alert, oriented, and engaged.  No obvious focal neurological deficits or cognitive impairments.  Sensorium seems unclouded.   Speech is clear and coherent with logical content. Psychiatric:  Appropriate mood, pleasant and cooperative demeanor,  thoughtful and engaged during the exam    No results found for any visits on 06/27/23. Lab on 04/18/2023  Component Date Value   Vitamin B-12 04/18/2023 844    Folate 04/18/2023 17.0    WBC 04/18/2023 6.7    RBC 04/18/2023 4.29    Platelets 04/18/2023 207.0    Hemoglobin 04/18/2023 12.0 (L)    HCT 04/18/2023 37.1 (L)    MCV 04/18/2023 86.5    MCHC 04/18/2023 32.3    RDW 04/18/2023 15.0    Sodium 04/18/2023 142    Potassium 04/18/2023 4.4    Chloride 04/18/2023 106    CO2 04/18/2023 29    Glucose, Bld 04/18/2023 119 (H)    BUN 04/18/2023 26 (H)    Creatinine, Ser 04/18/2023 1.05    Total Bilirubin 04/18/2023 1.1    Alkaline Phosphatase 04/18/2023 49    AST 04/18/2023 19    ALT 04/18/2023 16    Total Protein 04/18/2023 6.1    Albumin 04/18/2023 3.9    GFR 04/18/2023 68.77    Calcium 04/18/2023 9.1    Cholesterol 04/18/2023 124    Triglycerides 04/18/2023 92.0    HDL 04/18/2023 45.30    VLDL 04/18/2023 18.4    LDL Cholesterol 04/18/2023 60    Total CHOL/HDL Ratio 04/18/2023 3    NonHDL 04/18/2023 78.68    Magnesium 04/18/2023 2.0   Office Visit on 02/19/2023  Component Date Value   SARS Coronavirus 2 Ag 02/19/2023 Negative   Office Visit on 01/18/2023  Component Date Value   SARS Coronavirus 2 Ag 01/18/2023 Negative   Lab on 08/02/2022  Component Date Value   Magnesium 08/02/2022 1.9    Cholesterol 08/02/2022 129    Triglycerides 08/02/2022 77.0    HDL 08/02/2022 48.30    VLDL 08/02/2022 15.4    LDL Cholesterol 08/02/2022 65    Total CHOL/HDL Ratio 08/02/2022 3    NonHDL 08/02/2022 80.50    Sodium 08/02/2022 142    Potassium 08/02/2022 4.9    Chloride 08/02/2022 107    CO2 08/02/2022 27    Glucose, Bld 08/02/2022 113 (H)    BUN 08/02/2022 26 (H)    Creatinine, Ser 08/02/2022 1.04    Total Bilirubin 08/02/2022 1.0    Alkaline Phosphatase 08/02/2022 56    AST 08/02/2022 15    ALT 08/02/2022 12    Total Protein 08/02/2022 6.3    Albumin 08/02/2022 4.0     GFR 08/02/2022 69.91    Calcium 08/02/2022 9.4    WBC 08/02/2022 8.4    RBC 08/02/2022 4.40    Hemoglobin 08/02/2022 12.0 (L)    HCT 08/02/2022 37.2 (L)    MCV 08/02/2022 84.5    MCHC 08/02/2022 32.2    RDW 08/02/2022 17.5 (H)    Platelets 08/02/2022 220.0    Neutrophils Relative % 08/02/2022 64.0    Lymphocytes Relative 08/02/2022 22.1    Monocytes Relative 08/02/2022 8.0    Eosinophils Relative 08/02/2022 5.1 (H)    Basophils Relative 08/02/2022 0.8    Neutro Abs 08/02/2022 5.3    Lymphs Abs 08/02/2022 1.8    Monocytes  Absolute 08/02/2022 0.7    Eosinophils Absolute 08/02/2022 0.4    Basophils Absolute 08/02/2022 0.1    Vitamin B-12 08/02/2022 446    Folate 08/02/2022 11.2   No image results found. No results found.No results found.     Assessment & Plan Hypertension, unspecified type Hypertension readings have improved from up to 217/113 to the 150s under losartan , though side effects prompt a switch to telmisartan  for its smoother, longer-lasting effect, despite taking 3-4 days to reach full efficacy. Concurrent losartan  may be necessary initially. Uncontrolled hypertension risks include heart damage and increased stroke risk, while telmisartan  offers more stable control with fewer side effects. We will prescribe telmisartan  40 mg once daily, advise a half-dose of losartan  today if starting telmisartan , monitor blood pressure closely, and follow up in 1-2 months to assess control. Mucopurulent chronic bronchitis (HCC) Chronic Bronchitis with GERD Chronic bronchitis, exacerbated by GERD, presents with a persistent, phlegmy cough and difficulty clearing secretions, currently managed with azithromycin , amoxicillin , and prednisone . Severe reflux post-esophagectomy causes recurrent bronchitis. We recommend an elevated sleeping position and avoiding reflux triggers. Each reflux event risks cumulative lung damage, complicating blood pressure management. We will continue the current  regimen, prescribe saline nebulizer treatments, advise an elevated sleeping position, avoid reflux triggers, and order a chest x-ray if symptoms persist post-medication. Anxiety disorder, unspecified type Anxiety He reports jitteriness and anxiety, likely exacerbated by steroid and albuterol  nebulizer treatments, without current anxiety medication. We discussed reassessing anxiety post-steroid treatment and considering anxiety medication if symptoms persist.     Orders Placed During this Encounter:   Orders Placed This Encounter  Procedures   DG Chest 2 View    Standing Status:   Future    Expiration Date:   12/25/2023    Reason for Exam (SYMPTOM  OR DIAGNOSIS REQUIRED):   chronic bronchitis    Preferred imaging location?:   [address]   Meds ordered this encounter  Medications   telmisartan  (MICARDIS ) 40 MG tablet    Sig: Take 1 tablet (40 mg total) by mouth daily. Replaces losartan     Dispense:  90 tablet    Refill:  3   sodium chloride  0.9 % nebulizer solution    Sig: Take 3 mLs by nebulization as needed for wheezing.    Dispense:  90 mL    Refill:  3    General Health Maintenance Lifestyle changes to manage reflux and overall health include reducing intake of coffee, chocolate, wine, and fried foods, managing allergies with Xyzal as needed, and emphasizing the importance of an elevated sleeping position. We encourage avoiding reflux triggers, maintaining an elevated sleeping position, and documenting reflux events.  Follow-up We will follow up in 1-2 months to assess blood pressure control and overall health. A walk-in chest x-ray is available at [facility] office if symptoms persist.    This document was synthesized by artificial intelligence ([employer]) using HIPAA-compliant recording of the clinical interaction;   We discussed the use of AI scribe software for clinical note transcription with the patient, who gave verbal consent to proceed.    Additional Info: This  encounter employed state-of-the-art, real-time, collaborative documentation. The patient actively reviewed and assisted in updating their electronic medical record on a shared screen, ensuring transparency and facilitating joint problem-solving for the problem list, overview, and plan. This approach promotes accurate, informed care. The treatment plan was discussed and reviewed in detail, including medication safety, potential side effects, and all patient questions. We confirmed understanding and comfort with the  plan. Follow-up instructions were established, including contacting the office for any concerns, returning if symptoms worsen, persist, or new symptoms develop, and precautions for potential emergency department visits.

## 2023-06-27 NOTE — Assessment & Plan Note (Signed)
 Hypertension readings have improved from up to 217/113 to the 150s under losartan , though side effects prompt a switch to telmisartan  for its smoother, longer-lasting effect, despite taking 3-4 days to reach full efficacy. Concurrent losartan  may be necessary initially. Uncontrolled hypertension risks include heart damage and increased stroke risk, while telmisartan  offers more stable control with fewer side effects. We will prescribe telmisartan  40 mg once daily, advise a half-dose of losartan  today if starting telmisartan , monitor blood pressure closely, and follow up in 1-2 months to assess control.

## 2023-06-27 NOTE — Patient Instructions (Signed)
 VISIT SUMMARY:  During today's visit, we discussed your concerns about elevated blood pressure and persistent bronchitis. You have been monitoring your blood pressure at home, and it has improved recently. We also reviewed your chronic bronchitis, which is exacerbated by reflux due to your esophageal surgery. Additionally, we talked about your recent feelings of jitteriness and anxiety, likely related to your current steroid use. We went over your lifestyle changes to manage reflux and overall health.  YOUR PLAN:  -HYPERTENSION: Hypertension, or high blood pressure, can lead to serious health issues like heart damage and stroke. Your blood pressure has improved with losartan , but we are switching you to telmisartan  40 mg once daily for more stable control. Continue monitoring your blood pressure closely, and take a half-dose of losartan  today if you start telmisartan . We will follow up in 1-2 months to assess your blood pressure control.  -CHRONIC BRONCHITIS WITH GERD: Chronic bronchitis, worsened by acid reflux, causes a persistent cough and difficulty clearing phlegm. You are currently on azithromycin , amoxicillin , and prednisone . We recommend continuing these medications, using saline nebulizer treatments, sleeping in an elevated position, and avoiding reflux triggers. If symptoms persist after finishing your medication, a chest x-ray may be needed.  -ANXIETY: Your feelings of jitteriness and anxiety are likely due to your steroid and albuterol  nebulizer treatments. We will reassess your anxiety after you finish the steroid treatment and consider anxiety medication if needed.  -GENERAL HEALTH MAINTENANCE: You have made several lifestyle changes to manage your reflux, such as reducing intake of coffee, chocolate, wine, and fried foods, and sleeping in an elevated position. Continue these practices, manage allergies with Xyzal as needed, and document any reflux events.  INSTRUCTIONS:  Please follow  up in 1-2 months to assess your blood pressure control and overall health. If your bronchitis symptoms persist after finishing your medication, you can get a chest x-ray at the [facility] office.

## 2023-07-10 ENCOUNTER — Encounter: Payer: Self-pay | Admitting: Internal Medicine

## 2023-07-10 DIAGNOSIS — I10 Essential (primary) hypertension: Secondary | ICD-10-CM

## 2023-07-11 MED ORDER — AMLODIPINE BESYLATE 5 MG PO TABS: 5.0000 mg | ORAL_TABLET | Freq: Every day | ORAL | 2 refills | Status: DC

## 2023-07-15 ENCOUNTER — Encounter: Payer: Self-pay | Admitting: Internal Medicine

## 2023-07-15 NOTE — Telephone Encounter (Signed)
Please see pt msg as FYI and advise if anything further is needed

## 2023-07-31 ENCOUNTER — Ambulatory Visit: Payer: Medicare Other | Admitting: Internal Medicine

## 2023-08-31 ENCOUNTER — Other Ambulatory Visit: Payer: Self-pay | Admitting: Family Medicine

## 2023-09-05 ENCOUNTER — Encounter: Payer: Self-pay | Admitting: Internal Medicine

## 2023-09-14 ENCOUNTER — Encounter: Payer: Self-pay | Admitting: Internal Medicine

## 2023-09-14 DIAGNOSIS — R1319 Other dysphagia: Secondary | ICD-10-CM

## 2023-09-14 DIAGNOSIS — K219 Gastro-esophageal reflux disease without esophagitis: Secondary | ICD-10-CM

## 2023-09-14 DIAGNOSIS — K449 Diaphragmatic hernia without obstruction or gangrene: Secondary | ICD-10-CM

## 2023-09-14 DIAGNOSIS — Z9889 Other specified postprocedural states: Secondary | ICD-10-CM

## 2023-09-17 MED ORDER — PANTOPRAZOLE SODIUM 40 MG PO TBEC: 40.0000 mg | DELAYED_RELEASE_TABLET | Freq: Every day | ORAL | 3 refills | Status: DC

## 2023-09-17 NOTE — Telephone Encounter (Signed)
 MyChart secure digital messaging clinical encounter  Chief Complaint:  Request for pantoprazole refill for GERD management  Relevant History: - 78 year old male with history of GERD, hiatal hernia, h/o esophagectomy with mechanical complications - Prior episodes of aspiration pneumonia secondary to reflux (most recent 04/2023) - Started pantoprazole 40mg  daily in February 2025 through another provider in our office - Patient reports good symptom control on current therapy - Currently taking pantoprazole 40mg  daily - Recent hospitalization for aspiration pneumonia, likely GERD-related  Assessment: - Gastroesophageal reflux disease (K21.9) - Stable on pantoprazole therapy - History of esophagectomy (Z90.3) with mechanical complications increasing GERD risk - Large hiatal hernia (K44.9) - Confirmed on imaging - History of aspiration pneumonia (J69.0) related to GERD  Plan: 1. Continue pantoprazole 40mg  daily as previously prescribed 2. Provided adequate medication supply from 1-year prescription 3. Request follow-up appointment in next 4-6 weeks to discuss:    - Risks/benefits of long-term PPI therapy including:      - Potential vitamin B12, calcium, and magnesium deficiencies      - Possible increased risk of bone fractures with prolonged use      - Potential risk of chronic kidney disease with long-term use    - Monitoring parameters for long-term therapy    - Reinforcement of non-pharmacologic strategies (elevation of head of bed, avoiding trigger foods, etc.) 4. Will review recent labs at follow-up (hemoglobin 12.0 g/dL (L), normal W09 at 811 pg/mL) 5. Advised patient on warning signs requiring emergency evaluation  Medical Decision Making: Low complexity decision-making based on well-controlled symptoms with established therapy, but requiring assessment of long-term therapy risks/benefits given patient's complicated GI history and prior aspiration events. Coordination of care  required between GI specialist recommendations and primary management.  Current Medical Guidelines: [employer] of Gastroenterology guidelines recommend long-term PPI therapy for patients with complicated GERD, particularly those with risk factors for aspiration. Monitoring for potential nutritional deficiencies and consideration of lowest effective dose is recommended for maintenance therapy.  Please see the MyChart message reply(ies) for my assessment and plan.    This patient gave consent for this Medical Advice Message and is aware that it may result in a bill to [employer], as well as the possibility of receiving a bill for a co-payment or deductible. They are an established patient, but are not seeking medical advice exclusively about a problem treated during an in person or video visit in the last seven days. I did not recommend an in person or video visit within seven days of my reply.    I spent a total of 9 minutes cumulative time within 7 days through [employer]. [name], MD

## 2023-09-25 ENCOUNTER — Ambulatory Visit (INDEPENDENT_AMBULATORY_CARE_PROVIDER_SITE_OTHER): Admitting: Physician Assistant

## 2023-09-25 ENCOUNTER — Encounter: Payer: Self-pay | Admitting: Physician Assistant

## 2023-09-25 VITALS — BP 136/80 | HR 98 | Temp 98.1°F | Ht 66.0 in | Wt 168.0 lb

## 2023-09-25 DIAGNOSIS — R051 Acute cough: Secondary | ICD-10-CM

## 2023-09-25 MED ORDER — PREDNISONE 20 MG PO TABS: 20.0000 mg | ORAL_TABLET | Freq: Two times a day (BID) | ORAL | 0 refills | Status: DC

## 2023-09-25 MED ORDER — AZITHROMYCIN 250 MG PO TABS: ORAL_TABLET | ORAL | 0 refills | Status: AC

## 2023-09-25 MED ORDER — BUDESONIDE-FORMOTEROL FUMARATE 80-4.5 MCG/ACT IN AERO: 2.0000 | INHALATION_SPRAY | Freq: Two times a day (BID) | RESPIRATORY_TRACT | 3 refills | Status: DC

## 2023-09-25 NOTE — Patient Instructions (Signed)
 It was great to see you!  Start antibiotic and steroid  I've also sent in an inhaler for you to use twice daily -- start this whenever you feel your cold symptom(s) starting If this inhaler is too expensive, please tell your pharmacist to contact us  Take care,  Jarold Motto PA-C

## 2023-09-25 NOTE — Progress Notes (Signed)
 Jordan [name] is a 78 y.o. male here for a recurrence of a previously resolved problem.  History of Present Illness:   Chief Complaint  Patient presents with   Cough    Pt c/o cough, chest congestion, expectorating green/yellow sputum. Started 3 days ago.    HPI  Cough // Hx of chronic bronchitis: Pt complains of productive cough, chest congestion, starting 3 days ago.  He also endorses popping in his left ear.  He previously had a mild sore throat about 3 days ago, but states this self-resolved.  His symptoms seemed to start off feeling similar to his allergy symptoms, exacerbated by the pollen, then got increasingly worse with sinus symptoms and now "in the lungs".  He was previously seen with similar symptoms  1/6 as well as 1/9 with Dr. Jon Billings.  He states the steroids and antibiotics did help improve his condition.  Has used his nebulizer, albuterol inhaler, and did a saline rinse yesterday.  His nebulizer broke yesterday, his new one will arrive on Saturday.  Denies any fever, chills.   Past Medical History:  Diagnosis Date   Allergy    Arthritis    back, shoulder- both    Barrett's esophagus    Barrett's esophagus with dysplasia 07/24/2021   BPH (benign prostatic hyperplasia)    Chronic lower back pain    Herniated disc at L4   Chronic pain 07/24/2021   Chronic rhinitis    Chronic sinusitis 11/11/2018   ED (erectile dysfunction) of organic origin    GERD (gastroesophageal reflux disease)    Barrett's Esophagus, treated /w esophagectomy - 1998, continues toi have reflux & uses Zantac on a regular basis    Hemoptysis 11/11/2018   History of wheezing 11/11/2018   Hypertension 10/20/21   Neuromuscular disorder (HCC) 2011   Tingling feet   Neuropathy 06/18/2016   Pneumonitis due to inhalation of food or vomitus (HCC) 07/30/2022   Thyroid disease    Tinnitus    hearing loss in both ears    Urinary tract infectious disease 08/13/2022     Social History   Tobacco Use    Smoking status: Never   Smokeless tobacco: Never   Tobacco comments:    Never smoked  Vaping Use   Vaping status: Never Used  Substance Use Topics   Alcohol use: Not Currently   Drug use: No    Past Surgical History:  Procedure Laterality Date   CATARACT EXTRACTION Bilateral 2019   Dr. Alben Spittle   ESOPHAGECTOMY     1998- at [facility], /w epidural & sedation & pain management /w continued  epidural    EYE SURGERY  2018   Cataracts   HERNIA REPAIR     LUMBAR LAMINECTOMY/DECOMPRESSION MICRODISCECTOMY  03/22/2012   Procedure: LUMBAR LAMINECTOMY/DECOMPRESSION MICRODISCECTOMY 1 LEVEL;  Surgeon: Tia Alert, MD;  Location: [facility];  Service: Neurosurgery;  Laterality: Right;  right lumbar four-five extra-foraminal microdiscectomy   SPINE SURGERY  2013   Ruptured disc L4    Family History  Problem Relation Age of Onset   Hypertension Mother    Alcohol abuse Mother    Arthritis Mother    Cancer Mother    Hypertension Father    Cancer Father    Heart attack Father    Alcohol abuse Father    Stroke Father    Allergic rhinitis Neg Hx    Asthma Neg Hx    Eczema Neg Hx    Urticaria Neg Hx     Allergies  Allergen Reactions   Latex    Adhesive [Tape] Rash    Current Medications:   Current Outpatient Medications:    albuterol (VENTOLIN HFA) 108 (90 Base) MCG/ACT inhaler, Inhale 2 puffs into the lungs every 6 (six) hours as needed for wheezing or shortness of breath., Disp: 1 each, Rfl: 1   alfuzosin (UROXATRAL) 10 MG 24 hr tablet, TAKE 1 TABLET BY MOUTH AS NEEDED, Disp: 90 tablet, Rfl: 3   azithromycin (ZITHROMAX) 250 MG tablet, Take 2 tablets on day 1, then 1 tablet daily on days 2 through 5, Disp: 6 tablet, Rfl: 0   budesonide-formoterol (SYMBICORT) 80-4.5 MCG/ACT inhaler, Inhale 2 puffs into the lungs 2 (two) times daily., Disp: 1 each, Rfl: 3   Famotidine-Ca Carb-Mag Hydrox (PEPCID COMPLETE PO), Take by mouth daily., Disp: , Rfl:    HYDROcodone-acetaminophen  (NORCO/VICODIN) 5-325 MG tablet, Take 1 tablet by mouth every 6 (six) hours as needed for moderate pain (pain score 4-6) or severe pain (pain score 7-10) (do not drive while taking, do not mix with other sedatives such as etoh.)., Disp: 45 tablet, Rfl: 0   ibuprofen (ADVIL) 200 MG tablet, Take 200 mg by mouth 4 (four) times daily., Disp: , Rfl:    ipratropium-albuterol (DUONEB) 0.5-2.5 (3) MG/3ML SOLN, Take 3 mLs by nebulization every 6 (six) hours as needed., Disp: 270 mL, Rfl: 1   pantoprazole (PROTONIX) 40 MG tablet, Take 1 tablet (40 mg total) by mouth daily., Disp: 90 tablet, Rfl: 3   predniSONE (DELTASONE) 20 MG tablet, Take 1 tablet (20 mg total) by mouth 2 (two) times daily with a meal., Disp: 10 tablet, Rfl: 0   promethazine-dextromethorphan (PROMETHAZINE-DM) 6.25-15 MG/5ML syrup, Take 5 mLs by mouth 3 (three) times daily as needed for cough., Disp: 200 mL, Rfl: 0   sildenafil (VIAGRA) 100 MG tablet, , Disp: , Rfl:    sodium chloride 0.9 % nebulizer solution, Take 3 mLs by nebulization as needed for wheezing., Disp: 90 mL, Rfl: 3   Review of Systems:   Negative unless otherwise specified per HPI.  Vitals:   Vitals:   09/25/23 1411  BP: 136/80  Pulse: 98  Temp: 98.1 F (36.7 C)  TempSrc: Temporal  SpO2: 97%  Weight: 168 lb (76.2 kg)  Height: 5\' 6"  (1.676 m)     Body mass index is 27.12 kg/m.  Physical Exam:   Physical Exam Vitals and nursing note reviewed.  Constitutional:      General: He is not in acute distress.    Appearance: He is well-developed. He is not ill-appearing or toxic-appearing.  Cardiovascular:     Rate and Rhythm: Normal rate and regular rhythm.     Pulses: Normal pulses.     Heart sounds: Normal heart sounds, S1 normal and S2 normal.  Pulmonary:     Effort: Pulmonary effort is normal.     Breath sounds: Rhonchi present.  Skin:    General: Skin is warm and dry.  Neurological:     Mental Status: He is alert.     GCS: GCS eye subscore is 4. GCS  verbal subscore is 5. GCS motor subscore is 6.  Psychiatric:        Speech: Speech normal.        Behavior: Behavior normal. Behavior is cooperative.     Assessment and Plan:   1. Acute cough (Primary) No red flags on exam.   Will initiate oral prednisone and azithromycin per orders.  Continue nebulizers as needed I did  recommend use of inhaled steroid for these flares to help prevent developing into infection in the future -- sent in Symbicort for patient with instructions to use twice daily during allergy season or when symptom(s) arise and then to continue use until symptom(s) resolve and stop; with low threshold to restart this if needed  Consider CXR if no improvement of symptom(s)   Discussed taking medications as prescribed.  Reviewed return precautions including new or worsening fever, SOB, new or worsening cough or other concerns.  Push fluids and rest.  I recommend that patient follow-up if symptoms worsen or persist despite treatment x 7-10 days, sooner if needed.   I, Isabelle Course, acting as a Neurosurgeon for Jarold Motto, [place]., have documented all relevant documentation on the behalf of Jarold Motto, [place], as directed by  Jarold Motto, PA while in the presence of Jarold Motto, [place].  I, Jarold Motto, [place], have reviewed all documentation for this visit. The documentation on 09/25/23 for the exam, diagnosis, procedures, and orders are all accurate and complete.  Jarold Motto, PA-C

## 2023-09-27 ENCOUNTER — Telehealth: Payer: Self-pay | Admitting: *Deleted

## 2023-09-27 MED ORDER — FLUTICASONE-SALMETEROL 100-50 MCG/ACT IN AEPB: 1.0000 | INHALATION_SPRAY | Freq: Two times a day (BID) | RESPIRATORY_TRACT | 3 refills | Status: DC

## 2023-09-27 NOTE — Telephone Encounter (Signed)
 Received fax from pharmacy stating pt can not afford Symbicort Rx he would like an alternative.

## 2023-09-27 NOTE — Telephone Encounter (Signed)
 Spoke to pt told him per Lelon Mast,  I sent in Advair for patient.  If this is still too expensive, I recommend that he either ask his pharmacist or call his insurance company to see which inhaler is most affordable for him. Pt  Verbalized understanding.

## 2023-09-27 NOTE — Addendum Note (Signed)
 Addended by: Haynes Bast on: 09/27/2023 11:00 AM   Modules accepted: Orders

## 2023-10-15 ENCOUNTER — Ambulatory Visit (INDEPENDENT_AMBULATORY_CARE_PROVIDER_SITE_OTHER): Payer: Medicare Other

## 2023-10-15 VITALS — Ht 66.0 in | Wt 168.0 lb

## 2023-10-15 DIAGNOSIS — Z0000 Encounter for general adult medical examination without abnormal findings: Secondary | ICD-10-CM

## 2023-10-15 NOTE — Progress Notes (Deleted)
 Subjective:   Jordan [name] is a 78 y.o. who presents for a Medicare Wellness preventive visit.  Visit Complete: Virtual I connected with  Jordan [name] on 10/15/23 by a audio enabled telemedicine application and verified that I am speaking with the correct person using two identifiers.  Patient Location: Home  Provider Location: Office/Clinic  I discussed the limitations of evaluation and management by telemedicine. The patient expressed understanding and agreed to proceed.  Vital Signs: Because this visit was a virtual/telehealth visit, some criteria may be missing or patient reported. Any vitals not documented were not able to be obtained and vitals that have been documented are patient reported.  VideoDeclined- This patient declined Librarian, academic. Therefore the visit was completed with audio only.  Persons Participating in Visit: Patient.  AWV Questionnaire: No: Patient Medicare AWV questionnaire was not completed prior to this visit.  Cardiac Risk Factors include: advanced age (>39men, >2 women);dyslipidemia;hypertension;male gender     Objective:    Today's Vitals   10/15/23 1004  Weight: 168 lb (76.2 kg)  Height: 5\' 6"  (1.676 m)   Body mass index is 27.12 kg/m.     10/15/2023   10:10 AM 10/09/2022   10:06 AM 02/15/2022    6:00 PM 03/22/2012    4:15 AM 03/21/2012   10:50 PM 03/21/2012    1:35 PM 03/21/2012    1:17 PM  Advanced Directives  Does Patient Have a Medical Advance Directive? Yes Yes No Patient has advance directive, copy not in chart Patient has advance directive, copy not in chart Patient has advance directive, copy not in chart Patient does not have advance directive  Type of Advance Directive Healthcare Power of [place];Living will Healthcare Power of [place];Living will       Copy of Healthcare Power of Attorney in Chart? No - copy requested No - copy requested         Current Medications (verified) Outpatient Encounter  Medications as of 10/15/2023  Medication Sig   albuterol  (VENTOLIN  HFA) 108 (90 Base) MCG/ACT inhaler Inhale 2 puffs into the lungs every 6 (six) hours as needed for wheezing or shortness of breath.   Famotidine-Ca Carb-Mag Hydrox (PEPCID COMPLETE PO) Take by mouth daily.   fluticasone -salmeterol (ADVAIR) 100-50 MCG/ACT AEPB Inhale 1 puff into the lungs 2 (two) times daily.   HYDROcodone -acetaminophen  (NORCO/VICODIN) 5-325 MG tablet Take 1 tablet by mouth every 6 (six) hours as needed for moderate pain (pain score 4-6) or severe pain (pain score 7-10) (do not drive while taking, do not mix with other sedatives such as etoh.).   ibuprofen (ADVIL) 200 MG tablet Take 200 mg by mouth 4 (four) times daily.   ipratropium-albuterol  (DUONEB) 0.5-2.5 (3) MG/3ML SOLN Take 3 mLs by nebulization every 6 (six) hours as needed.   pantoprazole  (PROTONIX ) 40 MG tablet Take 1 tablet (40 mg total) by mouth daily.   saw palmetto 500 MG capsule Take 450 mg by mouth daily.   sildenafil (VIAGRA) 100 MG tablet    sodium chloride  0.9 % nebulizer solution Take 3 mLs by nebulization as needed for wheezing.   [DISCONTINUED] alfuzosin  (UROXATRAL ) 10 MG 24 hr tablet TAKE 1 TABLET BY MOUTH AS NEEDED   [DISCONTINUED] predniSONE  (DELTASONE ) 20 MG tablet Take 1 tablet (20 mg total) by mouth 2 (two) times daily with a meal.   [DISCONTINUED] promethazine -dextromethorphan (PROMETHAZINE -DM) 6.25-15 MG/5ML syrup Take 5 mLs by mouth 3 (three) times daily as needed for cough.   No facility-administered encounter medications on  file as of 10/15/2023.    Allergies (verified) Latex and Adhesive [tape]   History: Past Medical History:  Diagnosis Date   Allergy    Arthritis    back, shoulder- both    Barrett's esophagus    Barrett's esophagus with dysplasia 07/24/2021   BPH (benign prostatic hyperplasia)    Chronic lower back pain    Herniated disc at L4   Chronic pain 07/24/2021   Chronic rhinitis    Chronic sinusitis  11/11/2018   ED (erectile dysfunction) of organic origin    GERD (gastroesophageal reflux disease)    Barrett's Esophagus, treated /w esophagectomy - 1998, continues toi have reflux & uses Zantac on a regular basis    Hemoptysis 11/11/2018   History of wheezing 11/11/2018   Hypertension 10/20/21   Neuromuscular disorder (HCC) 2011   Tingling feet   Neuropathy 06/18/2016   Pneumonitis due to inhalation of food or vomitus (HCC) 07/30/2022   Thyroid  disease    Tinnitus    hearing loss in both ears    Urinary tract infectious disease 08/13/2022   Past Surgical History:  Procedure Laterality Date   CATARACT EXTRACTION Bilateral 2019   Dr. Reyne Cave   ESOPHAGECTOMY     1998- at [facility], /w epidural & sedation & pain management /w continued  epidural    EYE SURGERY  2018   Cataracts   HERNIA REPAIR     LUMBAR LAMINECTOMY/DECOMPRESSION MICRODISCECTOMY  03/22/2012   Procedure: LUMBAR LAMINECTOMY/DECOMPRESSION MICRODISCECTOMY 1 LEVEL;  Surgeon: Isadora Mar, MD;  Location: [facility];  Service: Neurosurgery;  Laterality: Right;  right lumbar four-five extra-foraminal microdiscectomy   SPINE SURGERY  2013   Ruptured disc L4   Family History  Problem Relation Age of Onset   Hypertension Mother    Alcohol abuse Mother    Arthritis Mother    Cancer Mother    Hypertension Father    Cancer Father    Heart attack Father    Alcohol abuse Father    Stroke Father    Allergic rhinitis Neg Hx    Asthma Neg Hx    Eczema Neg Hx    Urticaria Neg Hx    Social History   Socioeconomic History   Marital status: Married    Spouse name: Jordan [name]   Number of children: 4   Years of education: Not on file   Highest education level: Bachelor's degree (e.g., BA, AB, BS)  Occupational History   Occupation: Art gallery manager    Comment: Retired  Tobacco Use   Smoking status: Never   Smokeless tobacco: Never   Tobacco comments:    Never smoked  Vaping Use   Vaping status: Never Used  Substance and  Sexual Activity   Alcohol use: Not Currently   Drug use: No   Sexual activity: Yes    Birth control/protection: Surgical, None  Other Topics Concern   Not on file  Social History Narrative   Married father of 4: 3 girls and 1 boy.   Wife's name is Hospital doctor-   Retired Art gallery manager      No routine exercise.   1-2 cups coffee a day.   2-3 beers a week   Social Drivers of Corporate investment banker Strain: Low Risk  (10/15/2023)   Overall Financial Resource Strain (CARDIA)    Difficulty of Paying Living Expenses: Not hard at all  Food Insecurity: No Food Insecurity (10/15/2023)   Hunger Vital Sign    Worried About  Running Out of Food in the Last Year: Never true    Ran Out of Food in the Last Year: Never true  Transportation Needs: No Transportation Needs (10/15/2023)   PRAPARE - Administrator, Civil Service (Medical): No    Lack of Transportation (Non-Medical): No  Physical Activity: Sufficiently Active (10/15/2023)   Exercise Vital Sign    Days of Exercise per Week: 3 days    Minutes of Exercise per Session: 150+ min  Stress: No Stress Concern Present (10/15/2023)   [employer] of Occupational Health - Occupational Stress Questionnaire    Feeling of Stress : Not at all  Social Connections: Moderately Integrated (10/15/2023)   Social Connection and Isolation Panel [NHANES]    Frequency of Communication with Friends and Family: Three times a week    Frequency of Social Gatherings with Friends and Family: More than three times a week    Attends Religious Services: More than 4 times per year    Active Member of [employer] or Organizations: No    Attends Engineer, structural: Never    Marital Status: Married    Tobacco Counseling Counseling given: Not Answered Tobacco comments: Never smoked    Clinical Intake:  Pre-visit preparation completed: Yes  Pain : No/denies pain     BMI - recorded: 27.12 Nutritional Status: BMI 25 -29  Overweight Nutritional Risks: None Diabetes: No  No results found for: "HGBA1C"   How often do you need to have someone help you when you read instructions, pamphlets, or other written materials from your doctor or pharmacy?: 1 - Never  Interpreter Needed?: No  Information entered by :: Lamont Pilsner, LPN   Activities of Daily Living     10/15/2023   10:06 AM  In your present state of health, do you have any difficulty performing the following activities:  Hearing? 1  Comment hearing aids  Vision? 0  Difficulty concentrating or making decisions? 0  Walking or climbing stairs? 0  Dressing or bathing? 0  Doing errands, shopping? 0  Preparing Food and eating ? N  Using the Toilet? N  In the past six months, have you accidently leaked urine? N  Do you have problems with loss of bowel control? N  Managing your Medications? N  Managing your Finances? N  Housekeeping or managing your Housekeeping? N    Patient Care Team: Anthon Kins, MD as PCP - General (Internal Medicine) Arleen Lacer, MD as PCP - Cardiology (Cardiology) Alvis Jourdain, MD as Consulting Physician (Gastroenterology) Aleck Hurdle, MD as Consulting Physician (Pulmonary Disease) Joaquin Mulberry, MD as Consulting Physician (Neurosurgery)  Indicate any recent Medical Services you may have received from other than [facility] providers in the past year (date may be approximate).     Assessment:   This is a routine wellness examination for Jordan [name].  Hearing/Vision screen Hearing Screening - Comments:: Pt has hearing aids  Vision Screening - Comments:: Pt stated he doesn't recall providers name    Goals Addressed             This Visit's Progress    Patient Stated       Maintain health and activity         Depression Screen     10/15/2023   10:10 AM 06/27/2023    8:15 AM 04/15/2023   10:44 AM 02/19/2023    2:34 PM 10/09/2022   10:05 AM 08/02/2022    8:07 AM 07/30/2022    4:09  PM  PHQ 2/9  Scores  PHQ - 2 Score 0 0 0 0 0 0 0  PHQ- 9 Score  2    3     Fall Risk     10/15/2023   10:12 AM 06/27/2023    8:15 AM 04/15/2023   10:43 AM 02/19/2023    2:34 PM 10/08/2022    5:35 PM  Fall Risk   Falls in the past year? 0 0 1 0 0  Number falls in past yr: 0 0 0 0 0  Injury with Fall? 0 0 1 0 0  Risk for fall due to : No Fall Risks No Fall Risks No Fall Risks No Fall Risks Impaired vision  Follow up Falls prevention discussed Falls evaluation completed Falls evaluation completed  Falls prevention discussed    MEDICARE RISK AT HOME:  Medicare Risk at Home Any stairs in or around the home?: Yes If so, are there any without handrails?: No Home free of loose throw rugs in walkways, pet beds, electrical cords, etc?: Yes Adequate lighting in your home to reduce risk of falls?: Yes Life alert?: No Use of a cane, walker or w/c?: No Grab bars in the bathroom?: No Shower chair or bench in shower?: No Elevated toilet seat or a handicapped toilet?: No  TIMED UP AND GO:  Was the test performed?  No  Cognitive Function: 6CIT completed        10/15/2023   10:13 AM 10/09/2022   10:07 AM  6CIT Screen  What Year? 0 points 0 points  What month? 0 points 0 points  What time? 0 points 0 points  Count back from 20 0 points 0 points  Months in reverse 0 points 0 points  Repeat phrase 0 points 0 points  Total Score 0 points 0 points    Immunizations Immunization History  Administered Date(s) Administered   Influenza, High Dose Seasonal PF 04/12/2015   Influenza,inj,Quad PF,6-35 Mos 06/28/2011, 03/12/2014   Pneumococcal Conjugate-13 05/24/2017   Pneumococcal Polysaccharide-23 12/29/2012   Tdap 05/24/2017   Zoster, Live 06/28/2011    Screening Tests Health Maintenance  Topic Date Due   Zoster Vaccines- Shingrix (1 of 2) 05/29/1996   INFLUENZA VACCINE  01/17/2024   Medicare Annual Wellness (AWV)  10/14/2024   DTaP/Tdap/Td (2 - Td or Tdap) 05/25/2027   HPV VACCINES  Aged Out    Meningococcal B Vaccine  Aged Out   Pneumonia Vaccine 11+ Years old  Discontinued   Colonoscopy  Discontinued   COVID-19 Vaccine  Discontinued   Hepatitis C Screening  Discontinued    Health Maintenance  Health Maintenance Due  Topic Date Due   Zoster Vaccines- Shingrix (1 of 2) 05/29/1996   Health Maintenance Items Addressed: See Nurse Notes  Additional Screening:  Vision Screening: Recommended annual ophthalmology exams for early detection of glaucoma and other disorders of the eye.  Dental Screening: Recommended annual dental exams for proper oral hygiene  Community Resource Referral / Chronic Care Management: CRR required this visit?  No   CCM required this visit?  No     Plan:     I have personally reviewed and noted the following in the patient's chart:   Medical and social history Use of alcohol, tobacco or illicit drugs  Current medications and supplements including opioid prescriptions. Patient is not currently taking opioid prescriptions. Functional ability and status Nutritional status Physical activity Advanced directives List of other physicians Hospitalizations, surgeries, and ER visits in previous 12 months Vitals  Screenings to include cognitive, depression, and falls Referrals and appointments  In addition, I have reviewed and discussed with patient certain preventive protocols, quality metrics, and best practice recommendations. A written personalized care plan for preventive services as well as general preventive health recommendations were provided to patient.     Bruno Capri, LPN   11/13/4130   After Visit Summary: (MyChart) Due to this being a telephonic visit, the after visit summary with patients personalized plan was offered to patient via MyChart   Notes: Nothing significant to report at this time.

## 2023-10-15 NOTE — Patient Instructions (Signed)
 Mr. Jordan [name] , Thank you for taking time to come for your Medicare Wellness Visit. I appreciate your ongoing commitment to your health goals. Please review the following plan we discussed and let me know if I can assist you in the future.   Referrals/Orders/Follow-Ups/Clinician Recommendations: Maintain health and activity   This is a list of the screening recommended for you and due dates:  Health Maintenance  Topic Date Due   Zoster (Shingles) Vaccine (1 of 2) 05/29/1996   Flu Shot  01/17/2024   Medicare Annual Wellness Visit  10/14/2024   DTaP/Tdap/Td vaccine (2 - Td or Tdap) 05/25/2027   HPV Vaccine  Aged Out   Meningitis B Vaccine  Aged Out   Pneumonia Vaccine  Discontinued   Colon Cancer Screening  Discontinued   COVID-19 Vaccine  Discontinued   Hepatitis C Screening  Discontinued    Advanced directives: (Copy Requested) Please bring a copy of your health care power of attorney and living will to the office to be added to your chart at your convenience. You can mail to [facility] [address] [place], [place] [address] or email to [email or web address]  Next Medicare Annual Wellness Visit scheduled for next year: Yes

## 2023-10-15 NOTE — Progress Notes (Signed)
 Subjective:   Jordan [name] is a 78 y.o. who presents for a Medicare Wellness preventive visit.  Visit Complete: Virtual I connected with  Jordan [name] on 10/15/23 by a audio enabled telemedicine application and verified that I am speaking with the correct person using two identifiers.  Patient Location: Home  Provider Location: Office/Clinic  I discussed the limitations of evaluation and management by telemedicine. The patient expressed understanding and agreed to proceed.  Vital Signs: Because this visit was a virtual/telehealth visit, some criteria may be missing or patient reported. Any vitals not documented were not able to be obtained and vitals that have been documented are patient reported.  VideoDeclined- This patient declined Librarian, academic. Therefore the visit was completed with audio only.  Persons Participating in Visit: Patient.  AWV Questionnaire: No: Patient Medicare AWV questionnaire was not completed prior to this visit.  Cardiac Risk Factors include: advanced age (>26men, >21 women);dyslipidemia;hypertension;male gender     Objective:    Today's Vitals   10/15/23 1004  Weight: 168 lb (76.2 kg)  Height: 5\' 6"  (1.676 m)   Body mass index is 27.12 kg/m.     10/15/2023   10:10 AM 10/09/2022   10:06 AM 02/15/2022    6:00 PM 03/22/2012    4:15 AM 03/21/2012   10:50 PM 03/21/2012    1:35 PM 03/21/2012    1:17 PM  Advanced Directives  Does Patient Have a Medical Advance Directive? Yes Yes No Patient has advance directive, copy not in chart Patient has advance directive, copy not in chart Patient has advance directive, copy not in chart Patient does not have advance directive  Type of Advance Directive Healthcare Power of [place];Living will Healthcare Power of [place];Living will       Copy of Healthcare Power of Attorney in Chart? No - copy requested No - copy requested         Current Medications (verified) Outpatient Encounter  Medications as of 10/15/2023  Medication Sig   albuterol  (VENTOLIN  HFA) 108 (90 Base) MCG/ACT inhaler Inhale 2 puffs into the lungs every 6 (six) hours as needed for wheezing or shortness of breath.   Famotidine-Ca Carb-Mag Hydrox (PEPCID COMPLETE PO) Take by mouth daily.   fluticasone -salmeterol (ADVAIR) 100-50 MCG/ACT AEPB Inhale 1 puff into the lungs 2 (two) times daily.   HYDROcodone -acetaminophen  (NORCO/VICODIN) 5-325 MG tablet Take 1 tablet by mouth every 6 (six) hours as needed for moderate pain (pain score 4-6) or severe pain (pain score 7-10) (do not drive while taking, do not mix with other sedatives such as etoh.).   ibuprofen (ADVIL) 200 MG tablet Take 200 mg by mouth 4 (four) times daily.   ipratropium-albuterol  (DUONEB) 0.5-2.5 (3) MG/3ML SOLN Take 3 mLs by nebulization every 6 (six) hours as needed.   pantoprazole  (PROTONIX ) 40 MG tablet Take 1 tablet (40 mg total) by mouth daily.   saw palmetto 500 MG capsule Take 450 mg by mouth daily.   sildenafil (VIAGRA) 100 MG tablet    sodium chloride  0.9 % nebulizer solution Take 3 mLs by nebulization as needed for wheezing.   [DISCONTINUED] alfuzosin  (UROXATRAL ) 10 MG 24 hr tablet TAKE 1 TABLET BY MOUTH AS NEEDED   [DISCONTINUED] predniSONE  (DELTASONE ) 20 MG tablet Take 1 tablet (20 mg total) by mouth 2 (two) times daily with a meal.   [DISCONTINUED] promethazine -dextromethorphan (PROMETHAZINE -DM) 6.25-15 MG/5ML syrup Take 5 mLs by mouth 3 (three) times daily as needed for cough.   No facility-administered encounter medications on  file as of 10/15/2023.    Allergies (verified) Latex and Adhesive [tape]   History: Past Medical History:  Diagnosis Date   Allergy    Arthritis    back, shoulder- both    Barrett's esophagus    Barrett's esophagus with dysplasia 07/24/2021   BPH (benign prostatic hyperplasia)    Chronic lower back pain    Herniated disc at L4   Chronic pain 07/24/2021   Chronic rhinitis    Chronic sinusitis  11/11/2018   ED (erectile dysfunction) of organic origin    GERD (gastroesophageal reflux disease)    Barrett's Esophagus, treated /w esophagectomy - 1998, continues toi have reflux & uses Zantac on a regular basis    Hemoptysis 11/11/2018   History of wheezing 11/11/2018   Hypertension 10/20/21   Neuromuscular disorder (HCC) 2011   Tingling feet   Neuropathy 06/18/2016   Pneumonitis due to inhalation of food or vomitus (HCC) 07/30/2022   Thyroid  disease    Tinnitus    hearing loss in both ears    Urinary tract infectious disease 08/13/2022   Past Surgical History:  Procedure Laterality Date   CATARACT EXTRACTION Bilateral 2019   Dr. Reyne Cave   ESOPHAGECTOMY     1998- at [facility], /w epidural & sedation & pain management /w continued  epidural    EYE SURGERY  2018   Cataracts   HERNIA REPAIR     LUMBAR LAMINECTOMY/DECOMPRESSION MICRODISCECTOMY  03/22/2012   Procedure: LUMBAR LAMINECTOMY/DECOMPRESSION MICRODISCECTOMY 1 LEVEL;  Surgeon: Isadora Mar, MD;  Location: [facility];  Service: Neurosurgery;  Laterality: Right;  right lumbar four-five extra-foraminal microdiscectomy   SPINE SURGERY  2013   Ruptured disc L4   Family History  Problem Relation Age of Onset   Hypertension Mother    Alcohol abuse Mother    Arthritis Mother    Cancer Mother    Hypertension Father    Cancer Father    Heart attack Father    Alcohol abuse Father    Stroke Father    Allergic rhinitis Neg Hx    Asthma Neg Hx    Eczema Neg Hx    Urticaria Neg Hx    Social History   Socioeconomic History   Marital status: Married    Spouse name: Jordan [name]   Number of children: 4   Years of education: Not on file   Highest education level: Bachelor's degree (e.g., BA, AB, BS)  Occupational History   Occupation: Art gallery manager    Comment: Retired  Tobacco Use   Smoking status: Never   Smokeless tobacco: Never   Tobacco comments:    Never smoked  Vaping Use   Vaping status: Never Used  Substance and  Sexual Activity   Alcohol use: Not Currently   Drug use: No   Sexual activity: Yes    Birth control/protection: Surgical, None  Other Topics Concern   Not on file  Social History Narrative   Married father of 4: 3 girls and 1 boy.   Wife's name is Hospital doctor-   Retired Art gallery manager      No routine exercise.   1-2 cups coffee a day.   2-3 beers a week   Social Drivers of Corporate investment banker Strain: Low Risk  (10/15/2023)   Overall Financial Resource Strain (CARDIA)    Difficulty of Paying Living Expenses: Not hard at all  Food Insecurity: No Food Insecurity (10/15/2023)   Hunger Vital Sign    Worried About  Running Out of Food in the Last Year: Never true    Ran Out of Food in the Last Year: Never true  Transportation Needs: No Transportation Needs (10/15/2023)   PRAPARE - Administrator, Civil Service (Medical): No    Lack of Transportation (Non-Medical): No  Physical Activity: Sufficiently Active (10/15/2023)   Exercise Vital Sign    Days of Exercise per Week: 3 days    Minutes of Exercise per Session: 150+ min  Stress: No Stress Concern Present (10/15/2023)   [employer] of Occupational Health - Occupational Stress Questionnaire    Feeling of Stress : Not at all  Social Connections: Moderately Integrated (10/15/2023)   Social Connection and Isolation Panel [NHANES]    Frequency of Communication with Friends and Family: Three times a week    Frequency of Social Gatherings with Friends and Family: More than three times a week    Attends Religious Services: More than 4 times per year    Active Member of [employer] or Organizations: No    Attends Engineer, structural: Never    Marital Status: Married    Tobacco Counseling Counseling given: Not Answered Tobacco comments: Never smoked    Clinical Intake:  Pre-visit preparation completed: Yes  Pain : No/denies pain     BMI - recorded: 27.12 Nutritional Status: BMI 25 -29  Overweight Nutritional Risks: None Diabetes: No  No results found for: "HGBA1C"   How often do you need to have someone help you when you read instructions, pamphlets, or other written materials from your doctor or pharmacy?: 1 - Never  Interpreter Needed?: No  Information entered by :: Lamont Pilsner, LPN   Activities of Daily Living     10/15/2023   10:06 AM  In your present state of health, do you have any difficulty performing the following activities:  Hearing? 1  Comment hearing aids  Vision? 0  Difficulty concentrating or making decisions? 0  Walking or climbing stairs? 0  Dressing or bathing? 0  Doing errands, shopping? 0  Preparing Food and eating ? N  Using the Toilet? N  In the past six months, have you accidently leaked urine? N  Do you have problems with loss of bowel control? N  Managing your Medications? N  Managing your Finances? N  Housekeeping or managing your Housekeeping? N    Patient Care Team: Anthon Kins, MD as PCP - General (Internal Medicine) Arleen Lacer, MD as PCP - Cardiology (Cardiology) Alvis Jourdain, MD as Consulting Physician (Gastroenterology) Aleck Hurdle, MD as Consulting Physician (Pulmonary Disease) Joaquin Mulberry, MD as Consulting Physician (Neurosurgery)  Indicate any recent Medical Services you may have received from other than [facility] providers in the past year (date may be approximate).     Assessment:   This is a routine wellness examination for Jordan [name].  Hearing/Vision screen Hearing Screening - Comments:: Pt has hearing aids  Vision Screening - Comments:: Pt stated he doesn't recall providers name    Goals Addressed             This Visit's Progress    Patient Stated       Maintain health and activity         Depression Screen     10/15/2023   10:10 AM 06/27/2023    8:15 AM 04/15/2023   10:44 AM 02/19/2023    2:34 PM 10/09/2022   10:05 AM 08/02/2022    8:07 AM 07/30/2022    4:09  PM  PHQ 2/9  Scores  PHQ - 2 Score 0 0 0 0 0 0 0  PHQ- 9 Score  2    3     Fall Risk     10/15/2023   10:12 AM 06/27/2023    8:15 AM 04/15/2023   10:43 AM 02/19/2023    2:34 PM 10/08/2022    5:35 PM  Fall Risk   Falls in the past year? 0 0 1 0 0  Number falls in past yr: 0 0 0 0 0  Injury with Fall? 0 0 1 0 0  Risk for fall due to : No Fall Risks No Fall Risks No Fall Risks No Fall Risks Impaired vision  Follow up Falls prevention discussed Falls evaluation completed Falls evaluation completed  Falls prevention discussed    MEDICARE RISK AT HOME:  Medicare Risk at Home Any stairs in or around the home?: Yes If so, are there any without handrails?: No Home free of loose throw rugs in walkways, pet beds, electrical cords, etc?: Yes Adequate lighting in your home to reduce risk of falls?: Yes Life alert?: No Use of a cane, walker or w/c?: No Grab bars in the bathroom?: No Shower chair or bench in shower?: No Elevated toilet seat or a handicapped toilet?: No  TIMED UP AND GO:  Was the test performed?  No  Cognitive Function: 6CIT completed        10/15/2023   10:13 AM 10/09/2022   10:07 AM  6CIT Screen  What Year? 0 points 0 points  What month? 0 points 0 points  What time? 0 points 0 points  Count back from 20 0 points 0 points  Months in reverse 0 points 0 points  Repeat phrase 0 points 0 points  Total Score 0 points 0 points    Immunizations Immunization History  Administered Date(s) Administered   Influenza, High Dose Seasonal PF 04/12/2015   Influenza,inj,Quad PF,6-35 Mos 06/28/2011, 03/12/2014   Pneumococcal Conjugate-13 05/24/2017   Pneumococcal Polysaccharide-23 12/29/2012   Tdap 05/24/2017   Zoster, Live 06/28/2011    Screening Tests Health Maintenance  Topic Date Due   Zoster Vaccines- Shingrix (1 of 2) 05/29/1996   INFLUENZA VACCINE  01/17/2024   Medicare Annual Wellness (AWV)  10/14/2024   DTaP/Tdap/Td (2 - Td or Tdap) 05/25/2027   HPV VACCINES  Aged Out    Meningococcal B Vaccine  Aged Out   Pneumonia Vaccine 76+ Years old  Discontinued   Colonoscopy  Discontinued   COVID-19 Vaccine  Discontinued   Hepatitis C Screening  Discontinued    Health Maintenance  Health Maintenance Due  Topic Date Due   Zoster Vaccines- Shingrix (1 of 2) 05/29/1996   Health Maintenance Items Addressed: See Nurse Notes  Additional Screening:  Vision Screening: Recommended annual ophthalmology exams for early detection of glaucoma and other disorders of the eye.  Dental Screening: Recommended annual dental exams for proper oral hygiene  Community Resource Referral / Chronic Care Management: CRR required this visit?  No   CCM required this visit?  No     Plan:     I have personally reviewed and noted the following in the patient's chart:   Medical and social history Use of alcohol, tobacco or illicit drugs  Current medications and supplements including opioid prescriptions. Patient is currently taking opioid prescriptions. Information provided to patient regarding non-opioid alternatives. Patient advised to discuss non-opioid treatment plan with their provider. Functional ability and status Nutritional status Physical activity  Advanced directives List of other physicians Hospitalizations, surgeries, and ER visits in previous 12 months Vitals Screenings to include cognitive, depression, and falls Referrals and appointments  In addition, I have reviewed and discussed with patient certain preventive protocols, quality metrics, and best practice recommendations. A written personalized care plan for preventive services as well as general preventive health recommendations were provided to patient.     Bruno Capri, LPN   1/61/0960   After Visit Summary: (MyChart) Due to this being a telephonic visit, the after visit summary with patients personalized plan was offered to patient via MyChart   Notes: Nothing significant to report at this  time.

## 2023-10-30 DIAGNOSIS — N5201 Erectile dysfunction due to arterial insufficiency: Secondary | ICD-10-CM | POA: Diagnosis not present

## 2023-10-30 DIAGNOSIS — R350 Frequency of micturition: Secondary | ICD-10-CM | POA: Diagnosis not present

## 2023-10-30 DIAGNOSIS — N401 Enlarged prostate with lower urinary tract symptoms: Secondary | ICD-10-CM | POA: Diagnosis not present

## 2023-10-30 DIAGNOSIS — Z8042 Family history of malignant neoplasm of prostate: Secondary | ICD-10-CM | POA: Diagnosis not present

## 2023-12-06 ENCOUNTER — Ambulatory Visit: Admitting: Physician Assistant

## 2023-12-06 VITALS — BP 123/83 | HR 89 | Temp 97.7°F | Ht 66.0 in | Wt 170.0 lb

## 2023-12-06 DIAGNOSIS — J209 Acute bronchitis, unspecified: Secondary | ICD-10-CM | POA: Diagnosis not present

## 2023-12-06 DIAGNOSIS — J42 Unspecified chronic bronchitis: Secondary | ICD-10-CM | POA: Diagnosis not present

## 2023-12-06 MED ORDER — AZITHROMYCIN 250 MG PO TABS
ORAL_TABLET | ORAL | 0 refills | Status: AC
Start: 2023-12-06 — End: 2023-12-11

## 2023-12-06 MED ORDER — PREDNISONE 20 MG PO TABS: 40.0000 mg | ORAL_TABLET | Freq: Every day | ORAL | 0 refills | Status: AC

## 2023-12-06 NOTE — Patient Instructions (Addendum)
  VISIT SUMMARY: You visited us  today due to a flare-up of your chronic bronchitis symptoms, which you have been experiencing for the past five days. We discussed your current medications and made some adjustments to help manage your symptoms more effectively.  YOUR PLAN: CHRONIC BRONCHITIS: You are experiencing a flare-up of chronic bronchitis, likely due to acid reflux, with symptoms including yellow phlegm, fatigue, and wheezing. -We have prescribed a Z-Pak and prednisone  to help manage the exacerbation. -Continue using your Wixela inhaler twice daily and rinse your mouth after each use. -Use DuoNeb for bronchodilation and inflammation control. -Consider taking Mucinex for chest congestion, but ensure it is suitable for your hypertension. -Stay hydrated.  ACID REFLUX: Your acid reflux may be contributing to the flare-up of your chronic bronchitis. -Make dietary modifications to reduce your symptoms, particularly reducing sugar intake.  CHRONIC OBSTRUCTIVE PULMONARY DISEASE (COPD): You have COPD, which can lead to exacerbations like the one you are currently experiencing. -Continue using your Wixela inhaler twice daily and rinse your mouth after each use.  HYPERTENSION: Your high blood pressure affects the choice of medications for your bronchitis treatment. -Ensure any over-the-counter medications you take for bronchitis are suitable for your hypertension.                      Contains text generated by Abridge.                                 Contains text generated by Abridge.

## 2023-12-06 NOTE — Progress Notes (Signed)
 Patient ID: Jordan [name], male    DOB: April 29, 1946, 78 y.o.   MRN: [id number]   Assessment & Plan:  Acute exacerbation of chronic bronchitis (HCC)  Other orders -     Azithromycin ; Take 2 tablets on day 1, then 1 tablet daily on days 2 through 5  Dispense: 6 tablet; Refill: 0 -     predniSONE ; Take 2 tablets (40 mg total) by mouth daily with breakfast for 5 days.  Dispense: 10 tablet; Refill: 0     Assessment & Plan Chronic Bronchitis Exacerbation likely due to acid reflux, presenting with yellow phlegm for five days, fatigue, and wheezing in both lower lungs. No fever, chills, or pneumonia. Previous episodes in January and April responded to Z-Pak and prednisone . Discussed Wixela inhaler with a 7% pneumonia risk in COPD patients. - Prescribe Z-Pak and prednisone  for exacerbation management. - Continue Wixela inhaler twice daily with mouth rinsing post-use. - Use DuoNeb for bronchodilation and inflammation control. - Consider Mucinex for chest congestion, ensuring suitability for hypertension. - Encourage hydration.  Acid Reflux Contributing to chronic bronchitis exacerbations, with dietary indiscretions, particularly sugar consumption, as a factor. - Encourage dietary modifications to reduce symptoms.  Chronic Obstructive Pulmonary Disease (COPD) COPD with exacerbations. Discussed 7% pneumonia risk with Wixela use. - Continue Wixela inhaler with mouth rinsing post-use.  Hypertension Hypertension influencing medication choices for bronchitis treatment. Uses Coricidin due to hypertension. - Ensure over-the-counter medications for bronchitis are suitable for hypertension.      No follow-ups on file.    Subjective:    Chief Complaint  Patient presents with   Cough    Started about 5 days ago coughing , no fever, no body aches or chills, fatigue, tried otc medication, using wixlea , using nebulizer 1 or 2 a day.    Cough   Discussed the use of AI scribe software  for clinical note transcription with the patient, who gave verbal consent to proceed.  History of Present Illness Jordan [name] is a 78 year old male with chronic bronchitis and COPD who presents with a flare-up of bronchitis symptoms.  He has been experiencing a flare-up of chronic bronchitis symptoms for the past five days, characterized by coughing up yellow phlegm. He attributes these symptoms to acid reflux and has a history of similar episodes, with previous occurrences in January and April of this year.  He has been using a powdered inhaler, Wixela, which he started recently, and performing respiratory treatments. He has also been taking Coricidin, but notes no improvement in his symptoms. In the past, a combination of prednisone  and a Z-Pak resolved his symptoms within five days.  No recent travel or contact with sick individuals. He reports feeling tired but denies fever or chills. He has a history of pneumonitis and recalls being prescribed strong medications in the past to avoid hospitalization.  His current medications include Wixela inhaler, which he uses twice daily, and DuoNeb for respiratory treatments. He mentions being a 'sugar addict' and is trying to improve his diet, which he believes contributes to his acid reflux.  No fever, chills, or shortness of breath. He reports fatigue.     Past Medical History:  Diagnosis Date   Allergy    Arthritis    back, shoulder- both    Barrett's esophagus    Barrett's esophagus with dysplasia 07/24/2021   BPH (benign prostatic hyperplasia)    Chronic lower back pain    Herniated disc at L4   Chronic pain 07/24/2021  Chronic rhinitis    Chronic sinusitis 11/11/2018   ED (erectile dysfunction) of organic origin    GERD (gastroesophageal reflux disease)    Barrett's Esophagus, treated /w esophagectomy - 1998, continues toi have reflux & uses Zantac on a regular basis    Hemoptysis 11/11/2018   History of wheezing 11/11/2018    Hypertension 10/20/21   Neuromuscular disorder (HCC) 2011   Tingling feet   Neuropathy 06/18/2016   Pneumonitis due to inhalation of food or vomitus (HCC) 07/30/2022   Thyroid  disease    Tinnitus    hearing loss in both ears    Urinary tract infectious disease 08/13/2022    Past Surgical History:  Procedure Laterality Date   CATARACT EXTRACTION Bilateral 2019   Dr. Reyne Cave   ESOPHAGECTOMY     1998- at [facility], /w epidural & sedation & pain management /w continued  epidural    EYE SURGERY  2018   Cataracts   HERNIA REPAIR     LUMBAR LAMINECTOMY/DECOMPRESSION MICRODISCECTOMY  03/22/2012   Procedure: LUMBAR LAMINECTOMY/DECOMPRESSION MICRODISCECTOMY 1 LEVEL;  Surgeon: Isadora Mar, MD;  Location: [facility];  Service: Neurosurgery;  Laterality: Right;  right lumbar four-five extra-foraminal microdiscectomy   SPINE SURGERY  2013   Ruptured disc L4    Family History  Problem Relation Age of Onset   Hypertension Mother    Alcohol abuse Mother    Arthritis Mother    Cancer Mother    Hypertension Father    Cancer Father    Heart attack Father    Alcohol abuse Father    Stroke Father    Allergic rhinitis Neg Hx    Asthma Neg Hx    Eczema Neg Hx    Urticaria Neg Hx     Social History   Tobacco Use   Smoking status: Never   Smokeless tobacco: Never   Tobacco comments:    Never smoked  Vaping Use   Vaping status: Never Used  Substance Use Topics   Alcohol use: Not Currently   Drug use: No     Allergies  Allergen Reactions   Latex    Adhesive [Tape] Rash    Review of Systems  Respiratory:  Positive for cough.    NEGATIVE UNLESS OTHERWISE INDICATED IN HPI      Objective:     BP 123/83 (BP Location: Left Arm, Patient Position: Sitting, Cuff Size: Normal)   Pulse 89   Temp 97.7 F (36.5 C)   Ht 5' 6 (1.676 m)   Wt 170 lb (77.1 kg)   SpO2 95%   BMI 27.44 kg/m   Wt Readings from Last 3 Encounters:  12/06/23 170 lb (77.1 kg)  10/15/23 168 lb (76.2 kg)   09/25/23 168 lb (76.2 kg)    BP Readings from Last 3 Encounters:  12/06/23 123/83  09/25/23 136/80  06/27/23 (!) 157/82     Physical Exam Vitals and nursing note reviewed.  Constitutional:      General: He is not in acute distress.    Appearance: Normal appearance. He is not ill-appearing.  HENT:     Head: Normocephalic.     Right Ear: Tympanic membrane, ear canal and external ear normal.     Left Ear: Tympanic membrane, ear canal and external ear normal.     Nose: No congestion.     Mouth/Throat:     Mouth: Mucous membranes are moist.     Pharynx: No oropharyngeal exudate or posterior oropharyngeal erythema.   Eyes:  Extraocular Movements: Extraocular movements intact.     Conjunctiva/sclera: Conjunctivae normal.     Pupils: Pupils are equal, round, and reactive to light.    Cardiovascular:     Rate and Rhythm: Normal rate and regular rhythm.     Pulses: Normal pulses.     Heart sounds: Normal heart sounds. No murmur heard. Pulmonary:     Effort: Pulmonary effort is normal.     Breath sounds: Wheezing (bilateral lower lungs) present.   Musculoskeletal:     Cervical back: Normal range of motion.   Skin:    General: Skin is warm.   Neurological:     Mental Status: He is alert and oriented to person, place, and time.   Psychiatric:        Mood and Affect: Mood normal.        Behavior: Behavior normal.             Jasara Corrigan M Caydence Enck, PA-C

## 2023-12-12 ENCOUNTER — Telehealth: Payer: Self-pay | Admitting: *Deleted

## 2023-12-12 DIAGNOSIS — J411 Mucopurulent chronic bronchitis: Secondary | ICD-10-CM

## 2023-12-12 NOTE — Telephone Encounter (Signed)
 Copied from CRM [id number]. Topic: Clinical - Medical Advice >> Dec 12, 2023  8:12 AM Mesmerise C wrote: Reason for CRM: Patient stated the medication he's been taking medication for his chronic bronchitis ended his medication on Tuesday but no improvement inquiring about another prescription or possibly needed his lungs to be looked at patient can be reached at [phone]

## 2023-12-13 ENCOUNTER — Ambulatory Visit (INDEPENDENT_AMBULATORY_CARE_PROVIDER_SITE_OTHER): Admitting: Family

## 2023-12-13 ENCOUNTER — Encounter: Payer: Self-pay | Admitting: Family

## 2023-12-13 VITALS — BP 100/60 | HR 104 | Temp 97.9°F | Ht 66.0 in | Wt 164.8 lb

## 2023-12-13 DIAGNOSIS — J42 Unspecified chronic bronchitis: Secondary | ICD-10-CM

## 2023-12-13 MED ORDER — PREDNISONE 20 MG PO TABS: ORAL_TABLET | ORAL | 0 refills | Status: DC

## 2023-12-13 MED ORDER — BREZTRI AEROSPHERE 160-9-4.8 MCG/ACT IN AERO: 2.0000 | INHALATION_SPRAY | Freq: Two times a day (BID) | RESPIRATORY_TRACT | Status: DC

## 2023-12-13 NOTE — Progress Notes (Signed)
 Patient ID: Jordan [name], male    DOB: January 11, 1946, 78 y.o.   MRN: [id number]  Chief Complaint  Patient presents with   Cough    Pt c/o chronic bronchitis and this is a f/u visit  Discussed the use of AI scribe software for clinical note transcription with the patient, who gave verbal consent to proceed.  History of Present Illness Jordan [name] is a 78 year old male with chronic bronchitis who presents for a follow-up visit.  He experiences persistent coughing and fatigue since last Tuesday, with no significant improvement after completing azithromycin  and prednisone . Phlegm production has slightly improved, but coughing and fatigue persist, though it does not disturb his sleep. The cough produces clear mucus. He completed azithromycin  on Tuesday and took prednisone  40 mg daily for five days. He uses a nebulizer with ipratropium and Duoneb twice daily, sometimes three times, and a saline solution to manage mucus. He discontinued a powder inhaler due to severe coughing and discomfort. He manages acid reflux with daily Protonix  and Pepcid Complete at night, noting dietary indiscretions can exacerbate reflux and bronchitis. He has a history of pneumonia treated with strong antibiotics. He experiences ringing in his ear and uses a nasal spray and Coricidin for chest congestion, which he finds helpful. No ear pain but frequent throat clearing due to mucus.  Assessment & Plan Chronic Bronchitis Exacerbation with persistent cough, clear phlegm, and fatigue. No improvement after azithromycin  and prednisone  started 1 week ago. Previous episodes led to pneumonia. Adverse effects from Wixela inhaler. Discussed it can take a few weeks to completely recover and have energy again. - Extend prednisone  treatment. - Sample of Breztri  inhaler provided with long-acting bronchodilator, 2 puffs in am and pm. - Discontinue Wixela inhaler. - Increase Duoneb nebulizer use to three times daily. - Increase water intake  to 2L daily. - Call office if symptoms worsen or still not improved after finishing 2nd round of prednisone .  Acid Reflux Chronic acid reflux contributing to bronchitis exacerbations. Managed with Protonix  and Pepcid Complete. Dietary triggers worsen reflux. - Continue Protonix  and Pepcid Complete as currently prescribed.  Subjective:    Outpatient Medications Prior to Visit  Medication Sig Dispense Refill   albuterol  (VENTOLIN  HFA) 108 (90 Base) MCG/ACT inhaler Inhale 2 puffs into the lungs every 6 (six) hours as needed for wheezing or shortness of breath. 1 each 1   Famotidine-Ca Carb-Mag Hydrox (PEPCID COMPLETE PO) Take by mouth daily.     fluticasone -salmeterol (ADVAIR) 100-50 MCG/ACT AEPB Inhale 1 puff into the lungs 2 (two) times daily. 1 each 3   HYDROcodone -acetaminophen  (NORCO/VICODIN) 5-325 MG tablet Take 1 tablet by mouth every 6 (six) hours as needed for moderate pain (pain score 4-6) or severe pain (pain score 7-10) (do not drive while taking, do not mix with other sedatives such as etoh.). 45 tablet 0   ibuprofen (ADVIL) 200 MG tablet Take 200 mg by mouth 4 (four) times daily.     ipratropium-albuterol  (DUONEB) 0.5-2.5 (3) MG/3ML SOLN Take 3 mLs by nebulization every 6 (six) hours as needed. 270 mL 1   pantoprazole  (PROTONIX ) 40 MG tablet Take 1 tablet (40 mg total) by mouth daily. 90 tablet 3   saw palmetto 500 MG capsule Take 450 mg by mouth daily.     sildenafil (VIAGRA) 100 MG tablet      sodium chloride  0.9 % nebulizer solution Take 3 mLs by nebulization as needed for wheezing. 90 mL 3   No facility-administered medications prior to  visit.   Past Medical History:  Diagnosis Date   Allergy    Arthritis    back, shoulder- both    Barrett's esophagus    Barrett's esophagus with dysplasia 07/24/2021   BPH (benign prostatic hyperplasia)    Chronic lower back pain    Herniated disc at L4   Chronic pain 07/24/2021   Chronic rhinitis    Chronic sinusitis 11/11/2018    ED (erectile dysfunction) of organic origin    GERD (gastroesophageal reflux disease)    Barrett's Esophagus, treated /w esophagectomy - 1998, continues toi have reflux & uses Zantac on a regular basis    Hemoptysis 11/11/2018   History of wheezing 11/11/2018   Hypertension 10/20/21   Neuromuscular disorder (HCC) 2011   Tingling feet   Neuropathy 06/18/2016   Pneumonitis due to inhalation of food or vomitus (HCC) 07/30/2022   Thyroid  disease    Tinnitus    hearing loss in both ears    Urinary tract infectious disease 08/13/2022   Past Surgical History:  Procedure Laterality Date   CATARACT EXTRACTION Bilateral 2019   Dr. Lelon   ESOPHAGECTOMY     1998- at [facility], /w epidural & sedation & pain management /w continued  epidural    EYE SURGERY  2018   Cataracts   HERNIA REPAIR     LUMBAR LAMINECTOMY/DECOMPRESSION MICRODISCECTOMY  03/22/2012   Procedure: LUMBAR LAMINECTOMY/DECOMPRESSION MICRODISCECTOMY 1 LEVEL;  Surgeon: Alm GORMAN Molt, MD;  Location: [facility];  Service: Neurosurgery;  Laterality: Right;  right lumbar four-five extra-foraminal microdiscectomy   SPINE SURGERY  2013   Ruptured disc L4   Allergies  Allergen Reactions   Latex    Adhesive [Tape] Rash      Objective:    Physical Exam Vitals and nursing note reviewed.  Constitutional:      General: He is not in acute distress.    Appearance: Normal appearance.  HENT:     Head: Normocephalic.   Cardiovascular:     Rate and Rhythm: Normal rate and regular rhythm.  Pulmonary:     Effort: Pulmonary effort is normal.     Breath sounds: Examination of the right-upper field reveals rhonchi. Examination of the left-upper field reveals rhonchi. Examination of the right-middle field reveals rhonchi. Rhonchi present.   Musculoskeletal:        General: Normal range of motion.     Cervical back: Normal range of motion.   Skin:    General: Skin is warm and dry.   Neurological:     Mental Status: He is alert and  oriented to person, place, and time.   Psychiatric:        Mood and Affect: Mood normal.    BP 100/60   Pulse (!) 104   Temp 97.9 F (36.6 C)   Ht 5' 6 (1.676 m)   Wt 164 lb 12.8 oz (74.8 kg)   SpO2 96%   BMI 26.60 kg/m  Wt Readings from Last 3 Encounters:  12/13/23 164 lb 12.8 oz (74.8 kg)  12/06/23 170 lb (77.1 kg)  10/15/23 168 lb (76.2 kg)      Lucius Krabbe, NP

## 2023-12-23 ENCOUNTER — Ambulatory Visit (INDEPENDENT_AMBULATORY_CARE_PROVIDER_SITE_OTHER): Admitting: Internal Medicine

## 2023-12-23 ENCOUNTER — Ambulatory Visit (INDEPENDENT_AMBULATORY_CARE_PROVIDER_SITE_OTHER)

## 2023-12-23 ENCOUNTER — Encounter: Payer: Self-pay | Admitting: Internal Medicine

## 2023-12-23 VITALS — BP 136/78 | HR 88 | Temp 98.2°F | Ht 66.0 in | Wt 167.0 lb

## 2023-12-23 DIAGNOSIS — R918 Other nonspecific abnormal finding of lung field: Secondary | ICD-10-CM | POA: Diagnosis not present

## 2023-12-23 DIAGNOSIS — R059 Cough, unspecified: Secondary | ICD-10-CM | POA: Diagnosis not present

## 2023-12-23 DIAGNOSIS — J40 Bronchitis, not specified as acute or chronic: Secondary | ICD-10-CM | POA: Diagnosis not present

## 2023-12-23 DIAGNOSIS — J418 Mixed simple and mucopurulent chronic bronchitis: Secondary | ICD-10-CM | POA: Diagnosis not present

## 2023-12-23 DIAGNOSIS — J690 Pneumonitis due to inhalation of food and vomit: Secondary | ICD-10-CM

## 2023-12-23 DIAGNOSIS — K219 Gastro-esophageal reflux disease without esophagitis: Secondary | ICD-10-CM

## 2023-12-23 DIAGNOSIS — Z831 Family history of other infectious and parasitic diseases: Secondary | ICD-10-CM | POA: Diagnosis not present

## 2023-12-23 DIAGNOSIS — R053 Chronic cough: Secondary | ICD-10-CM | POA: Diagnosis not present

## 2023-12-23 MED ORDER — TRELEGY ELLIPTA 100-62.5-25 MCG/ACT IN AEPB: 1.0000 | INHALATION_SPRAY | Freq: Every day | RESPIRATORY_TRACT | 11 refills | Status: DC

## 2023-12-23 MED ORDER — LEVOFLOXACIN 750 MG PO TABS: 750.0000 mg | ORAL_TABLET | Freq: Every day | ORAL | 0 refills | Status: DC

## 2023-12-23 NOTE — Assessment & Plan Note (Signed)
 Chronic Bronchitis with Recurrent Aspiration (00785): Condition not at goal with persistent productive cough, dyspnea on minimal exertion, and fatigue despite two recent courses of prednisone  plus prior antibiotic therapy (Augmentin , azithromycin ). Bilateral wheezing is noted on exam, supporting ongoing airway inflammation. Active management is initiated by adjusting therapy to Levofloxacin  750?mg PO daily for 5 days (QTc 409; no recent cardiac issues; patient remains 77 with good CNS function). CT chest and chest x-ray are ordered to evaluate for pneumonia, bronchiectasis, or other complications. Inhaled therapy is transitioned to Trelegy due to Breztri  supply limitations and coverage exclusion. Coordinating referrals include airway clearance evaluation via chest physiotherapy and prompt pulmonology follow-up with Dr. Nikita Desai for advanced management. A reassessment is planned within 1-2 weeks following completion of the antibiotic course.

## 2023-12-23 NOTE — Assessment & Plan Note (Signed)
 Gastroesophageal reflux disease (GERD)   GERD is well-managed with pantoprazole , effectively controlling symptoms. He should continue the medication to prevent gastric acid from affecting the lungs, especially given the current respiratory issues.

## 2023-12-23 NOTE — Progress Notes (Signed)
 ==============================  [facility]: [phone]   -- Medical Office Visit --  Patient: Jordan [name]      Age: 78 y.o.       Sex:  male  Date:   12/23/2023 Today's Healthcare Provider: Bernardino KANDICE Cone, MD  ==============================   Chief Complaint: Cough (Has been going on for three weeks has been inhaler and abx has not been helping. Steroid as well help little comes right back.)   Discussed the use of AI scribe software for clinical note transcription with the patient, who gave verbal consent to proceed.  History of Present Illness 78 year old male with chronic bronchitis who presents with persistent shortness of breath and cough.  He has experienced persistent shortness of breath and a productive cough for the past three weeks. The cough is characterized by difficulty expectorating clear phlegm despite deep coughing. Saline solution, which previously helped loosen the phlegm, is not as effective as before. He experiences significant fatigue and shortness of breath with minimal exertion, such as performing household tasks.  He has a history of chronic bronchitis, pneumonic aspirations, and pneumonias. Various treatments including antibiotics, steroids, nebulizers, and inhalers have provided only temporary relief. Antibiotics like azithromycin  and Augmentin , effective in the past, have not resolved his symptoms this time. He has not taken antibiotics for about three weeks. Steroids have been tried in two rounds without clear benefit, and he has experienced undesirable side effects.  His current medications include a trial inhaler, which he uses twice daily, and pantoprazole  for acid reflux. He has about eight doses left of the trial inhaler and plenty of saline solution for nebulization. He has not seen his lung doctor recently, who he consulted once for acid reflux issues.  Family history is notable for his mother having had  tuberculosis, which he refers to as 'the consumption'.  He has not undergone chest physiotherapy before and has not had a recent chest x-ray or CT scan. He is concerned about his lung capacity decreasing, as he feels more short of breath than usual. No recent fever.   Updated Problem List Entries: No problems updated.  Background Reviewed: Problem List: has GERD (gastroesophageal reflux disease); Hypertension; Allergies; Back pain at L4-L5 level; Displacement of lumbar intervertebral disc without myelopathy; Dysphagia; Dizziness; Hypertensive retinopathy; Lower urinary tract symptoms due to benign prostatic hyperplasia; Mixed hyperlipidemia; Stricture of esophagus; Chronic bronchitis (HCC); H/O esophagectomy; Mechanical complication of esophagostomy (HCC); Insomnia; PSA elevation; BPH (benign prostatic hyperplasia); Restless legs syndrome; Hyperlipidemia, acquired; Peripheral polyneuropathy; Hiatal hernia; Arthritis of carpometacarpal [facility]) joint of left thumb; and Chronic anemia on their problem list. Past Medical History:  has a past medical history of Allergy, Arthritis, Barrett's esophagus, Barrett's esophagus with dysplasia (07/24/2021), BPH (benign prostatic hyperplasia), Chronic lower back pain, Chronic pain (07/24/2021), Chronic rhinitis, Chronic sinusitis (11/11/2018), ED (erectile dysfunction) of organic origin, GERD (gastroesophageal reflux disease), Hemoptysis (11/11/2018), History of wheezing (11/11/2018), Hypertension (10/20/21), Neuromuscular disorder (HCC) (2011), Neuropathy (06/18/2016), Pneumonitis due to inhalation of food or vomitus (HCC) (07/30/2022), Thyroid  disease, Tinnitus, and Urinary tract infectious disease (08/13/2022). Past Surgical History:   has a past surgical history that includes Esophagectomy; Lumbar laminectomy/decompression microdiscectomy (03/22/2012); Hernia repair; Cataract extraction (Bilateral, 2019); Eye surgery (2018); and Spine surgery (2013). Social History:    reports that he has never smoked. He has never used smokeless tobacco. He reports that he does not currently use alcohol. He reports that he does not use drugs. Family History:  family history includes  Alcohol abuse in his father and mother; Arthritis in his mother; Cancer in his father and mother; Heart attack in his father; Hypertension in his father and mother; Stroke in his father. Allergies:  is allergic to latex and adhesive [tape].   Medication Reconciliation: Current Outpatient Medications on File Prior to Visit  Medication Sig   albuterol  (VENTOLIN  HFA) 108 (90 Base) MCG/ACT inhaler Inhale 2 puffs into the lungs every 6 (six) hours as needed for wheezing or shortness of breath.   Famotidine-Ca Carb-Mag Hydrox (PEPCID COMPLETE PO) Take by mouth daily.   HYDROcodone -acetaminophen  (NORCO/VICODIN) 5-325 MG tablet Take 1 tablet by mouth every 6 (six) hours as needed for moderate pain (pain score 4-6) or severe pain (pain score 7-10) (do not drive while taking, do not mix with other sedatives such as etoh.).   ibuprofen (ADVIL) 200 MG tablet Take 200 mg by mouth 4 (four) times daily.   ipratropium-albuterol  (DUONEB) 0.5-2.5 (3) MG/3ML SOLN Take 3 mLs by nebulization every 6 (six) hours as needed.   pantoprazole  (PROTONIX ) 40 MG tablet Take 1 tablet (40 mg total) by mouth daily.   predniSONE  (DELTASONE ) 20 MG tablet Take 2 pills in the morning with breakfast for 3 days, then 1 pill for 2 days   saw palmetto 500 MG capsule Take 450 mg by mouth daily.   sildenafil (VIAGRA) 100 MG tablet    sodium chloride  0.9 % nebulizer solution Take 3 mLs by nebulization as needed for wheezing.   No current facility-administered medications on file prior to visit.   Medications Discontinued During This Encounter  Medication Reason   budesonide -glycopyrrolate -formoterol  (BREZTRI  AEROSPHERE) 160-9-4.8 MCG/ACT AERO inhaler Not covered by the pt's insurance     Physical Exam:    12/23/2023    2:16 PM  12/13/2023    9:32 AM 12/06/2023    1:26 PM  Vitals with BMI  Height 5' 6 5' 6 5' 6  Weight 167 lbs 164 lbs 13 oz 170 lbs  BMI 26.97 26.61 27.45  Systolic 136 100 876  Diastolic 78 60 83  Pulse 88 104 89  Vital signs reviewed.  Nursing notes reviewed. Weight trend reviewed. Physical Exam General Appearance:  No acute distress appreciable.   Well-groomed, healthy-appearing male.  Well proportioned with no abnormal fat distribution.  Good muscle tone. Pulmonary:  Normal work of breathing at rest, no respiratory distress apparent. SpO2: 98 %  Musculoskeletal: All extremities are intact.  Neurological:  Awake, alert, oriented, and engaged.  No obvious focal neurological deficits or cognitive impairments.  Sensorium seems unclouded.   Speech is clear and coherent with logical content. Psychiatric:  Appropriate mood, pleasant and cooperative demeanor, thoughtful and engaged during the exam Physical Exam CHEST: Wheezing present in all lung fields, predominantly in large airways. Good airflow in all lung fields.   Results:    10/15/2023   10:10 AM 06/27/2023    8:15 AM 04/15/2023   10:44 AM 02/19/2023    2:34 PM  PHQ 2/9 Scores  PHQ - 2 Score 0 0 0 0  PHQ- 9 Score  2     Results     No results found for any visits on 12/23/23. Lab on 04/18/2023  Component Date Value Ref Range Status   Vitamin B-12 04/18/2023 844  211 - 911 pg/mL Final   Folate 04/18/2023 17.0  >5.9 ng/mL Final   WBC 04/18/2023 6.7  4.0 - 10.5 K/uL Final   RBC 04/18/2023 4.29  4.22 - 5.81 Mil/uL Final  Platelets 04/18/2023 207.0  150.0 - 400.0 K/uL Final   Hemoglobin 04/18/2023 12.0 (L)  13.0 - 17.0 g/dL Final   HCT 89/68/7975 37.1 (L)  39.0 - 52.0 % Final   MCV 04/18/2023 86.5  78.0 - 100.0 fl Final   MCHC 04/18/2023 32.3  30.0 - 36.0 g/dL Final   RDW 89/68/7975 15.0  11.5 - 15.5 % Final   Sodium 04/18/2023 142  135 - 145 mEq/L Final   Potassium 04/18/2023 4.4  3.5 - 5.1 mEq/L Final   Chloride 04/18/2023 106   96 - 112 mEq/L Final   CO2 04/18/2023 29  19 - 32 mEq/L Final   Glucose, Bld 04/18/2023 119 (H)  70 - 99 mg/dL Final   BUN 89/68/7975 26 (H)  6 - 23 mg/dL Final   Creatinine, Ser 04/18/2023 1.05  0.40 - 1.50 mg/dL Final   Total Bilirubin 04/18/2023 1.1  0.2 - 1.2 mg/dL Final   Alkaline Phosphatase 04/18/2023 49  39 - 117 U/L Final   AST 04/18/2023 19  0 - 37 U/L Final   ALT 04/18/2023 16  0 - 53 U/L Final   Total Protein 04/18/2023 6.1  6.0 - 8.3 g/dL Final   Albumin 89/68/7975 3.9  3.5 - 5.2 g/dL Final   GFR 89/68/7975 68.77  >60.00 mL/min Final   Calcium 04/18/2023 9.1  8.4 - 10.5 mg/dL Final   Cholesterol 89/68/7975 124  0 - 200 mg/dL Final   Triglycerides 89/68/7975 92.0  0.0 - 149.0 mg/dL Final   HDL 89/68/7975 45.30  >39.00 mg/dL Final   VLDL 89/68/7975 18.4  0.0 - 40.0 mg/dL Final   LDL Cholesterol 04/18/2023 60  0 - 99 mg/dL Final   Total CHOL/HDL Ratio 04/18/2023 3   Final   NonHDL 04/18/2023 78.68   Final   Magnesium 04/18/2023 2.0  1.5 - 2.5 mg/dL Final  Office Visit on 02/19/2023  Component Date Value Ref Range Status   SARS Coronavirus 2 Ag 02/19/2023 Negative  Negative Final  Office Visit on 01/18/2023  Component Date Value Ref Range Status   SARS Coronavirus 2 Ag 01/18/2023 Negative  Negative Final  Lab on 08/02/2022  Component Date Value Ref Range Status   Magnesium 08/02/2022 1.9  1.5 - 2.5 mg/dL Final   Cholesterol 97/84/7975 129  0 - 200 mg/dL Final   Triglycerides 97/84/7975 77.0  0.0 - 149.0 mg/dL Final   HDL 97/84/7975 48.30  >39.00 mg/dL Final   VLDL 97/84/7975 15.4  0.0 - 40.0 mg/dL Final   LDL Cholesterol 08/02/2022 65  0 - 99 mg/dL Final   Total CHOL/HDL Ratio 08/02/2022 3   Final   NonHDL 08/02/2022 80.50   Final   Sodium 08/02/2022 142  135 - 145 mEq/L Final   Potassium 08/02/2022 4.9  3.5 - 5.1 mEq/L Final   Chloride 08/02/2022 107  96 - 112 mEq/L Final   CO2 08/02/2022 27  19 - 32 mEq/L Final   Glucose, Bld 08/02/2022 113 (H)  70 - 99 mg/dL Final    BUN 97/84/7975 26 (H)  6 - 23 mg/dL Final   Creatinine, Ser 08/02/2022 1.04  0.40 - 1.50 mg/dL Final   Total Bilirubin 08/02/2022 1.0  0.2 - 1.2 mg/dL Final   Alkaline Phosphatase 08/02/2022 56  39 - 117 U/L Final   AST 08/02/2022 15  0 - 37 U/L Final   ALT 08/02/2022 12  0 - 53 U/L Final   Total Protein 08/02/2022 6.3  6.0 - 8.3 g/dL Final  Albumin 08/02/2022 4.0  3.5 - 5.2 g/dL Final   GFR 97/84/7975 69.91  >60.00 mL/min Final   Calcium 08/02/2022 9.4  8.4 - 10.5 mg/dL Final   WBC 97/84/7975 8.4  4.0 - 10.5 K/uL Final   RBC 08/02/2022 4.40  4.22 - 5.81 Mil/uL Final   Hemoglobin 08/02/2022 12.0 (L)  13.0 - 17.0 g/dL Final   HCT 97/84/7975 37.2 (L)  39.0 - 52.0 % Final   MCV 08/02/2022 84.5  78.0 - 100.0 fl Final   MCHC 08/02/2022 32.2  30.0 - 36.0 g/dL Final   RDW 97/84/7975 17.5 (H)  11.5 - 15.5 % Final   Platelets 08/02/2022 220.0  150.0 - 400.0 K/uL Final   Neutrophils Relative % 08/02/2022 64.0  43.0 - 77.0 % Final   Lymphocytes Relative 08/02/2022 22.1  12.0 - 46.0 % Final   Monocytes Relative 08/02/2022 8.0  3.0 - 12.0 % Final   Eosinophils Relative 08/02/2022 5.1 (H)  0.0 - 5.0 % Final   Basophils Relative 08/02/2022 0.8  0.0 - 3.0 % Final   Neutro Abs 08/02/2022 5.3  1.4 - 7.7 K/uL Final   Lymphs Abs 08/02/2022 1.8  0.7 - 4.0 K/uL Final   Monocytes Absolute 08/02/2022 0.7  0.1 - 1.0 K/uL Final   Eosinophils Absolute 08/02/2022 0.4  0.0 - 0.7 K/uL Final   Basophils Absolute 08/02/2022 0.1  0.0 - 0.1 K/uL Final   Vitamin B-12 08/02/2022 446  211 - 911 pg/mL Final   Folate 08/02/2022 11.2  >5.9 ng/mL Final  Admission on 02/15/2022, Discharged on 02/16/2022  Component Date Value Ref Range Status   Sodium 02/15/2022 141  135 - 145 mmol/L Final   Potassium 02/15/2022 5.2 (H)  3.5 - 5.1 mmol/L Final   Chloride 02/15/2022 108  98 - 111 mmol/L Final   CO2 02/15/2022 28  22 - 32 mmol/L Final   Glucose, Bld 02/15/2022 118 (H)  70 - 99 mg/dL Final   BUN 91/68/7976 24 (H)  8 - 23  mg/dL Final   Creatinine, Ser 02/15/2022 1.01  0.61 - 1.24 mg/dL Final   Calcium 91/68/7976 10.2  8.9 - 10.3 mg/dL Final   Total Protein 91/68/7976 6.8  6.5 - 8.1 g/dL Final   Albumin 91/68/7976 4.2  3.5 - 5.0 g/dL Final   AST 91/68/7976 21  15 - 41 U/L Final   ALT 02/15/2022 21  0 - 44 U/L Final   Alkaline Phosphatase 02/15/2022 50  38 - 126 U/L Final   Total Bilirubin 02/15/2022 1.8 (H)  0.3 - 1.2 mg/dL Final   GFR, Estimated 02/15/2022 >60  >60 mL/min Final   Anion gap 02/15/2022 5  5 - 15 Final   WBC 02/15/2022 8.1  4.0 - 10.5 K/uL Final   RBC 02/15/2022 3.91 (L)  4.22 - 5.81 MIL/uL Final   Hemoglobin 02/15/2022 12.7 (L)  13.0 - 17.0 g/dL Final   HCT 91/68/7976 37.4 (L)  39.0 - 52.0 % Final   MCV 02/15/2022 95.7  80.0 - 100.0 fL Final   MCH 02/15/2022 32.5  26.0 - 34.0 pg Final   MCHC 02/15/2022 34.0  30.0 - 36.0 g/dL Final   RDW 91/68/7976 13.1  11.5 - 15.5 % Final   Platelets 02/15/2022 190  150 - 400 K/uL Final   nRBC 02/15/2022 0.0  0.0 - 0.2 % Final   Neutrophils Relative % 02/15/2022 63  % Final   Neutro Abs 02/15/2022 5.0  1.7 - 7.7 K/uL Final   Lymphocytes  Relative 02/15/2022 27  % Final   Lymphs Abs 02/15/2022 2.2  0.7 - 4.0 K/uL Final   Monocytes Relative 02/15/2022 6  % Final   Monocytes Absolute 02/15/2022 0.5  0.1 - 1.0 K/uL Final   Eosinophils Relative 02/15/2022 3  % Final   Eosinophils Absolute 02/15/2022 0.3  0.0 - 0.5 K/uL Final   Basophils Relative 02/15/2022 1  % Final   Basophils Absolute 02/15/2022 0.0  0.0 - 0.1 K/uL Final   Immature Granulocytes 02/15/2022 0  % Final   Abs Immature Granulocytes 02/15/2022 0.03  0.00 - 0.07 K/uL Final   ABO/RH(D) 02/15/2022 A POS   Final   Antibody Screen 02/15/2022 NEG   Final   Sample Expiration 02/15/2022    Final                   Value:02/18/2022,2359 Performed at [facility], [address], [place], [place] [address]    Prothrombin Time 02/15/2022 14.4  11.4 - 15.2 seconds Final   INR  02/15/2022 1.1  0.8 - 1.2 Final   Color, Urine 02/15/2022 YELLOW  YELLOW Final   APPearance 02/15/2022 CLEAR  CLEAR Final   Specific Gravity, Urine 02/15/2022 1.021  1.005 - 1.030 Final   pH 02/15/2022 5.0  5.0 - 8.0 Final   Glucose, UA 02/15/2022 NEGATIVE  NEGATIVE mg/dL Final   Hgb urine dipstick 02/15/2022 MODERATE (A)  NEGATIVE Final   Bilirubin Urine 02/15/2022 NEGATIVE  NEGATIVE Final   Ketones, ur 02/15/2022 NEGATIVE  NEGATIVE mg/dL Final   Protein, ur 91/68/7976 NEGATIVE  NEGATIVE mg/dL Final   Nitrite 91/68/7976 NEGATIVE  NEGATIVE Final   Leukocytes,Ua 02/15/2022 NEGATIVE  NEGATIVE Final   RBC / HPF 02/15/2022 0-5  0 - 5 RBC/hpf Final   WBC, UA 02/15/2022 0-5  0 - 5 WBC/hpf Final   Bacteria, UA 02/15/2022 NONE SEEN  NONE SEEN Final   Mucus 02/15/2022 PRESENT   Final   Hyaline Casts, UA 02/15/2022 PRESENT   Final     Previewed CXR done today: clear, hyperinflated lungs, no acute abnormality       ASSESSMENT & PLAN   Assessment & Plan Mixed simple and mucopurulent chronic bronchitis (HCC) Chronic Bronchitis with Recurrent Aspiration (00785): Condition not at goal with persistent productive cough, dyspnea on minimal exertion, and fatigue despite two recent courses of prednisone  plus prior antibiotic therapy (Augmentin , azithromycin ). Bilateral wheezing is noted on exam, supporting ongoing airway inflammation. Active management is initiated by adjusting therapy to Levofloxacin  750?mg PO daily for 5 days (QTc 409; no recent cardiac issues; patient remains 77 with good CNS function). CT chest and chest x-ray are ordered to evaluate for pneumonia, bronchiectasis, or other complications. Inhaled therapy is transitioned to Trelegy due to Breztri  supply limitations and coverage exclusion. Coordinating referrals include airway clearance evaluation via chest physiotherapy and prompt pulmonology follow-up with Dr. Nikita Desai for advanced management. A reassessment is planned within 1-2  weeks following completion of the antibiotic course. FH: tuberculosis  Aspiration pneumonia of right lower lobe due to gastric secretions (HCC) Chronic bronchitis due to pneumonic aspirations   Chronic bronchitis presents with persistent dyspnea, cough, and fatigue. Previous antibiotics and steroids provided only temporary relief. Current symptoms include wheezing across all lung fields, suggesting extensive airway involvement. There is concern for an underlying infection or complication, such as pneumonia or tuberculosis. Levaquin  is recommended for its efficacy in lung infections, despite potential risks like cardiac issues and tendon ruptures. AI assistance confirmed the  appropriateness of prescribing Levaquin . A chest CT and x-ray are ordered to evaluate for pneumonia or other complications. Levaquin  750 mg orally is prescribed for 5 days. He is referred to a respiratory therapist for airway clearance therapy and to pulmonologist Dr. Nikita Desai for further evaluation and management. He should contact Dr. Correne office to schedule an appointment. The potential use of a manufacturer coupon for Trelegy to manage chronic bronchitis symptoms was discussed. Chronic cough  Gastroesophageal reflux disease, unspecified whether esophagitis present Gastroesophageal reflux disease (GERD)   GERD is well-managed with pantoprazole , effectively controlling symptoms. He should continue the medication to prevent gastric acid from affecting the lungs, especially given the current respiratory issues.  Follow-up   Follow-up is necessary to monitor his response to the new antibiotic regimen and ensure coordination with the pulmonologist for further management of chronic bronchitis. A follow-up appointment should be scheduled after completing the Levaquin  course to assess treatment efficacy and ensure ongoing management with the pulmonologist.   ORDER ASSOCIATIONS  #   DIAGNOSIS / CONDITION ICD-10 ENCOUNTER ORDER      ICD-10-CM   1. Mixed simple and mucopurulent chronic bronchitis (HCC)  J41.8 Fluticasone -Umeclidin-Vilant (TRELEGY ELLIPTA ) 100-62.5-25 MCG/ACT AEPB    DG Chest 2 View    CT Chest Wo Contrast    Chest physiotherapy    2. FH: tuberculosis  Z83.1 Chest physiotherapy    3. Aspiration pneumonia of right lower lobe due to gastric secretions (HCC)  J69.0 Chest physiotherapy    levofloxacin  (LEVAQUIN ) 750 MG tablet    4. Chronic cough  R05.3 Chest physiotherapy    5. Gastroesophageal reflux disease, unspecified whether esophagitis present  K21.9       Referral Orders  No referral(s) requested today  Coordinated care to pulmonary Dr. Meade and referred to physiotherapy  Meds ordered this encounter  Medications   Fluticasone -Umeclidin-Vilant (TRELEGY ELLIPTA ) 100-62.5-25 MCG/ACT AEPB    Sig: Inhale 1 puff into the lungs daily.    Dispense:  1 each    Refill:  11   levofloxacin  (LEVAQUIN ) 750 MG tablet    Sig: Take 1 tablet (750 mg total) by mouth daily.    Dispense:  5 tablet    Refill:  0    Orders Placed This Encounter  Procedures   DG Chest 2 View    Chronic productive cough, dyspnea, fatigue x several weeks. History of chronic bronchitis and recurrent aspiration. No response to 2 courses of oral steroids (prednisone ) and antibiotics (Augmentin , azithromycin ). DuoNebs and Breztri  have provided partial relief. Concern for:  Bronchiectasis  Chronic aspiration pneumonitis  Airway disease (including possible obstruction or mass)  Interstitial lung disease    Standing Status:   Future    Number of Occurrences:   1    Expiration Date:   06/24/2024    Reason for Exam (SYMPTOM  OR DIAGNOSIS REQUIRED):   cough and shortness of breath.    Preferred imaging location?:   [facility]   CT Chest Wo Contrast    Chronic productive cough, dyspnea, fatigue x several weeks. History of chronic bronchitis and recurrent aspiration. No response to 2 courses of oral steroids  (prednisone ) and antibiotics (Augmentin , azithromycin ). DuoNebs and Breztri  have provided partial relief. Concern for:  Bronchiectasis  Chronic aspiration pneumonitis  Airway disease (including possible obstruction or mass)  Interstitial lung disease    Standing Status:   Future    Expiration Date:   12/22/2024    Preferred imaging location?:   [facility]  [facility]   Chest physiotherapy    Order:  Chest physiotherapy / airway clearance therapy evaluation and treatment  Reason:  Chronic bronchitis with suspected bronchiectasis and recurrent aspiration; patient with ongoing productive cough and dyspnea despite appropriate medical management. Needs evaluation for daily airway clearance techniques.    Standing Status:   Future    Expiration Date:   12/22/2024   ED Discharge Orders          Ordered    Fluticasone -Umeclidin-Vilant (TRELEGY ELLIPTA ) 100-62.5-25 MCG/ACT AEPB  Daily        12/23/23 1506    DG Chest 2 View       Comments: Chronic productive cough, dyspnea, fatigue x several weeks. History of chronic bronchitis and recurrent aspiration. No response to 2 courses of oral steroids (prednisone ) and antibiotics (Augmentin , azithromycin ). DuoNebs and Breztri  have provided partial relief. Concern for:  Bronchiectasis  Chronic aspiration pneumonitis  Airway disease (including possible obstruction or mass)  Interstitial lung disease   12/23/23 1506    CT Chest Wo Contrast       Comments: Chronic productive cough, dyspnea, fatigue x several weeks. History of chronic bronchitis and recurrent aspiration. No response to 2 courses of oral steroids (prednisone ) and antibiotics (Augmentin , azithromycin ). DuoNebs and Breztri  have provided partial relief. Concern for:  Bronchiectasis  Chronic aspiration pneumonitis  Airway disease (including possible obstruction or mass)  Interstitial lung disease   12/23/23 1506    Chest physiotherapy       Comments: Order:  Chest  physiotherapy / airway clearance therapy evaluation and treatment  Reason:  Chronic bronchitis with suspected bronchiectasis and recurrent aspiration; patient with ongoing productive cough and dyspnea despite appropriate medical management. Needs evaluation for daily airway clearance techniques.   12/23/23 1506    levofloxacin  (LEVAQUIN ) 750 MG tablet  Daily        12/23/23 1506              This document was synthesized by artificial intelligence ([employer]) using HIPAA-compliant recording of the clinical interaction;   We discussed the use of AI scribe software for clinical note transcription with the patient, who gave verbal consent to proceed. additional Info: This encounter employed state-of-the-art, real-time, collaborative documentation. The patient actively reviewed and assisted in updating their electronic medical record on a shared screen, ensuring transparency and facilitating joint problem-solving for the problem list, overview, and plan. This approach promotes accurate, informed care. The treatment plan was discussed and reviewed in detail, including medication safety, potential side effects, and all patient questions. We confirmed understanding and comfort with the plan. Follow-up instructions were established, including contacting the office for any concerns, returning if symptoms worsen, persist, or new symptoms develop, and precautions for potential emergency department visits.

## 2023-12-23 NOTE — Patient Instructions (Addendum)
 VISIT SUMMARY:  During your visit, we discussed your persistent shortness of breath and productive cough, which have been ongoing for the past three weeks. We reviewed your history of chronic bronchitis and previous treatments, and we have developed a new plan to address your symptoms.  YOUR PLAN:  -CHRONIC BRONCHITIS DUE TO PNEUMONIC ASPIRATIONS: Chronic bronchitis is a long-term inflammation of the airways in the lungs, often caused by repeated infections or irritants. To address your current symptoms, we have prescribed Levaquin  750 mg orally for 5 days to treat any potential underlying infection. We have also ordered a chest CT and x-ray to check for pneumonia or other complications. Additionally, you are referred to a respiratory therapist for airway clearance therapy and to pulmonologist Dr. Nikita Desai for further evaluation and management. Please contact Dr. Correne office to schedule an appointment. We also discussed the potential use of a manufacturer coupon for Trelegy to help manage your chronic bronchitis symptoms.  -GASTROESOPHAGEAL REFLUX DISEASE (GERD): GERD is a condition where stomach acid frequently flows back into the tube connecting your mouth and stomach, causing irritation. Your GERD is well-managed with pantoprazole , and you should continue taking this medication to prevent gastric acid from affecting your lungs, especially given your current respiratory issues.  INSTRUCTIONS:  Please follow up after completing the Levaquin  course to assess the effectiveness of the treatment and to ensure ongoing management with the pulmonologist. Make sure to schedule an appointment with Dr. Nikita Desai for further evaluation and management of your chronic bronchitis.  It was a pleasure seeing you today! Your health and satisfaction are our top priorities.  Bernardino Cone, MD  Your Providers PCP: Cone Bernardino MATSU, MD,  [phone]) Referring Provider: Cone Bernardino MATSU, MD,   [phone]) Care Team Provider: Anner Alm ORN, MD,  [phone]) Care Team Provider: Rollin [place], MD,  [phone]) Care Team Provider: Meade Verdon RAMAN, MD,  [phone]) Care Team Provider: Joshua Alm Hamilton, MD,  [phone])     NEXT STEPS: [x]  Early Intervention: Schedule sooner appointment, call our on-call services, or go to emergency room if there is any significant Increase in pain or discomfort New or worsening symptoms Sudden or severe changes in your health [x]  Flexible Follow-Up: We recommend a No follow-ups on file. for optimal routine care. This allows for progress monitoring and treatment adjustments. [x]  Preventive Care: Schedule your annual preventive care visit! It's typically covered by insurance and helps identify potential health issues early. [x]  Lab & X-ray Appointments: Incomplete tests scheduled today, or call to schedule. X-rays: [facility] (M-F, 8:30am-noon or 1pm-5pm). [x]  Medical Information Release: Sign a release form at front desk to obtain relevant medical information we don't have.  MAKING THE MOST OF OUR FOCUSED 20 MINUTE APPOINTMENTS: [x]   Clearly state your top concerns at the beginning of the visit to focus our discussion [x]   If you anticipate you will need more time, please inform the front desk during scheduling - we can book multiple appointments in the same week. [x]   If you have transportation problems- use our convenient video appointments or ask about transportation support. [x]   We can get down to business faster if you use MyChart to update information before the visit and submit non-urgent questions before your visit. Thank you for taking the time to provide details through MyChart.  Let our nurse know and she can import this information into your encounter documents.  Arrival and Wait Times: [x]   Arriving on time ensures that everyone receives prompt attention. [  x]  Early morning (8a) and afternoon (1p)  appointments tend to have shortest wait times. [x]   Unfortunately, we cannot delay appointments for late arrivals or hold slots during phone calls.  Getting Answers and Following Up [x]   Simple Questions & Concerns: For quick questions or basic follow-up after your visit, reach us  at [phone] or MyChart messaging. [x]   Complex Concerns: If your concern is more complex, scheduling an appointment might be best. Discuss this with the staff to find the most suitable option. [x]   Lab & Imaging Results: We'll contact you directly if results are abnormal or you don't use MyChart. Most normal results will be on MyChart within 2-3 business days, with a review message from Dr. Jesus. Haven't heard back in 2 weeks? Need results sooner? Contact us  at [phone]. [x]   Referrals: Our referral coordinator will manage specialist referrals. The specialist's office should contact you within 2 weeks to schedule an appointment. Call us  if you haven't heard from them after 2 weeks.  Staying Connected [x]   MyChart: Activate your MyChart for the fastest way to access results and message us . See the last page of this paperwork for instructions on how to activate.  Bring to Your Next Appointment [x]   Medications: Please bring all your medication bottles to your next appointment to ensure we have an accurate record of your prescriptions. [x]   Health Diaries: If you're monitoring any health conditions at home, keeping a diary of your readings can be very helpful for discussions at your next appointment.  Billing [x]   X-ray & Lab Orders: These are billed by separate companies. Contact the invoicing company directly for questions or concerns. [x]   Visit Charges: Discuss any billing inquiries with our administrative services team.  Your Satisfaction Matters [x]   Share Your Experience: We strive for your satisfaction! If you have any complaints, or preferably compliments, please let Dr. Jesus know directly or  contact our Practice Administrators, Manuelita Rubin or [employer], by asking at the front desk.   Reviewing Your Records [x]   Review this early draft of your clinical encounter notes below and the final encounter summary tomorrow on MyChart after its been completed.  All orders placed so far are visible here: Mixed simple and mucopurulent chronic bronchitis (HCC) Assessment & Plan: Chronic Bronchitis with Recurrent Aspiration (00785): Condition not at goal with persistent productive cough, dyspnea on minimal exertion, and fatigue despite two recent courses of prednisone  plus prior antibiotic therapy (Augmentin , azithromycin ). Bilateral wheezing is noted on exam, supporting ongoing airway inflammation. Active management is initiated by adjusting therapy to Levofloxacin  750?mg PO daily for 5 days (QTc 409; no recent cardiac issues; patient remains 77 with good CNS function). CT chest and chest x-ray are ordered to evaluate for pneumonia, bronchiectasis, or other complications. Inhaled therapy is transitioned to Trelegy due to Breztri  supply limitations and coverage exclusion. Coordinating referrals include airway clearance evaluation via chest physiotherapy and prompt pulmonology follow-up with Dr. Nikita Desai for advanced management. A reassessment is planned within 1-2 weeks following completion of the antibiotic course.  Orders: -     Trelegy Ellipta ; Inhale 1 puff into the lungs daily.  Dispense: 1 each; Refill: 11 -     DG Chest 2 View; Future -     CT CHEST WO CONTRAST; Future -     Chest physiotherapy; Future  FH: tuberculosis -     Chest physiotherapy; Future  Aspiration pneumonia of right lower lobe due to gastric secretions (HCC) -  Chest physiotherapy; Future -     levoFLOXacin ; Take 1 tablet (750 mg total) by mouth daily.  Dispense: 5 tablet; Refill: 0  Chronic cough -     Chest physiotherapy; Future  Gastroesophageal reflux disease, unspecified whether esophagitis  present Assessment & Plan: Gastroesophageal reflux disease (GERD)   GERD is well-managed with pantoprazole , effectively controlling symptoms. He should continue the medication to prevent gastric acid from affecting the lungs, especially given the current respiratory issues.

## 2023-12-27 ENCOUNTER — Ambulatory Visit
Admission: RE | Admit: 2023-12-27 | Discharge: 2023-12-27 | Disposition: A | Discharge status: Home / Self Care | Source: Ambulatory Visit | Attending: Internal Medicine

## 2023-12-27 DIAGNOSIS — R053 Chronic cough: Secondary | ICD-10-CM | POA: Diagnosis not present

## 2023-12-27 DIAGNOSIS — R918 Other nonspecific abnormal finding of lung field: Secondary | ICD-10-CM | POA: Diagnosis not present

## 2023-12-27 DIAGNOSIS — J418 Mixed simple and mucopurulent chronic bronchitis: Secondary | ICD-10-CM

## 2023-12-28 NOTE — Addendum Note (Signed)
 Addended by: Jahanna Raether G on: 12/28/2023 08:13 AM   Modules accepted: Orders

## 2023-12-30 ENCOUNTER — Other Ambulatory Visit: Payer: Self-pay

## 2023-12-30 ENCOUNTER — Ambulatory Visit: Payer: Self-pay | Admitting: Internal Medicine

## 2023-12-30 DIAGNOSIS — K9433 Esophagostomy malfunction: Secondary | ICD-10-CM

## 2023-12-30 DIAGNOSIS — R051 Acute cough: Secondary | ICD-10-CM

## 2023-12-30 DIAGNOSIS — Z9049 Acquired absence of other specified parts of digestive tract: Secondary | ICD-10-CM

## 2023-12-30 DIAGNOSIS — J411 Mucopurulent chronic bronchitis: Secondary | ICD-10-CM

## 2023-12-30 DIAGNOSIS — J320 Chronic maxillary sinusitis: Secondary | ICD-10-CM

## 2023-12-30 DIAGNOSIS — K219 Gastro-esophageal reflux disease without esophagitis: Secondary | ICD-10-CM

## 2023-12-30 DIAGNOSIS — J42 Unspecified chronic bronchitis: Secondary | ICD-10-CM

## 2023-12-30 NOTE — Progress Notes (Signed)
 Official report finds unexpected problem: IMPRESSION: 2.4 cm ground-glass opacity in the right upper lobe inferiorly. Short-term follow-up in 3 months is recommended to assess for stability. If the lesion is persistent, follow-up examination every 2 years is recommended to a total of 5 years.  Needs a lung specialist to help us  determine what is causing that problem but it does not seem to be resolving with antibiotics or steroids.  Let us  know if you do not have something scheduled with lung specialist yet.

## 2023-12-30 NOTE — Telephone Encounter (Signed)
 Placed.

## 2024-01-01 ENCOUNTER — Telehealth: Payer: Self-pay

## 2024-01-01 DIAGNOSIS — J411 Mucopurulent chronic bronchitis: Secondary | ICD-10-CM

## 2024-01-01 DIAGNOSIS — Z9049 Acquired absence of other specified parts of digestive tract: Secondary | ICD-10-CM

## 2024-01-01 NOTE — Telephone Encounter (Signed)
 Copied from CRM [id number]. Topic: Clinical - Medication Question >> Jan 01, 2024 11:09 AM Jordan [name] wrote: Reason for CRM: Patient was prescribed Fluticasone -Umeclidin-Vilant (TRELEGY ELLIPTA ) 100-62.5-25 MCG/ACT AEPB [[id number]]; however, patient is being charged out of pocket $450 which he cannot afford. He would like to know if there is an alternate or if he can go back to prescribing one of the previous inhalers he use order. Patient is leaving out of town tomorrow.

## 2024-01-02 ENCOUNTER — Other Ambulatory Visit: Payer: Self-pay | Admitting: Pharmacist

## 2024-01-02 ENCOUNTER — Telehealth: Payer: Self-pay | Admitting: Pharmacist

## 2024-01-02 ENCOUNTER — Other Ambulatory Visit: Payer: Self-pay | Admitting: Internal Medicine

## 2024-01-02 DIAGNOSIS — J411 Mucopurulent chronic bronchitis: Secondary | ICD-10-CM

## 2024-01-02 MED ORDER — FLUTICASONE-SALMETEROL 250-50 MCG/ACT IN AEPB: 1.0000 | INHALATION_SPRAY | Freq: Two times a day (BID) | RESPIRATORY_TRACT | 3 refills | Status: DC

## 2024-01-02 MED ORDER — TRELEGY ELLIPTA 100-62.5-25 MCG/ACT IN AEPB: 1.0000 | INHALATION_SPRAY | Freq: Every day | RESPIRATORY_TRACT | 11 refills | Status: DC

## 2024-01-02 NOTE — Addendum Note (Signed)
 Addended by: Jaymon Dudek G on: 01/02/2024 08:14 AM   Modules accepted: Orders

## 2024-01-02 NOTE — Addendum Note (Signed)
 Addended by: Jahniyah Revere G on: 01/02/2024 03:00 PM   Modules accepted: Orders

## 2024-01-02 NOTE — Telephone Encounter (Signed)
 Received an urgent referral from patient's PCP regarding assistance with finding an affordable inhaler. Tried to call patient to get additional information and to discuss possibilities.  It does look like he has [employer] Medicare Part D plan which has a $590 deductible to meet. Also has a pretty high cost / copay tier structure. Need to assess to see if he might be eligible for medication assistance program.  LM on VM with my CB# [phone] or I am in the [facility] office today - [phone]

## 2024-01-07 ENCOUNTER — Ambulatory Visit: Payer: Self-pay

## 2024-01-07 NOTE — Telephone Encounter (Addendum)
 We havent seen patient in over a year. See if Candis or a provider at another location can see him sooner, otherwise she can go to UC if symptoms are worsening.

## 2024-01-07 NOTE — Telephone Encounter (Addendum)
 FYI Only or Action Required?: Action required by provider: request for appointment.  Patient is followed in Pulmonology for GERD, last seen on 08/22/2022 by Jordan Verdon RAMAN, MD.  Called Nurse Triage reporting Shortness of Breath and Cough.  Symptoms began several weeks ago.  Interventions attempted: OTC medications: mucinex, sudafed, nasal spray, Rescue inhaler, and Nebulizer treatments.  Symptoms are: gradually worsening.  Triage Disposition: Call Specialist Today, See PCP Within 2 Weeks  Patient/caregiver understands and will follow disposition?: Yes     Copied from CRM [id number]. Topic: Clinical - Red Word Triage >> Jan 07, 2024 11:41 AM Jordan [name] wrote: Red Word that prompted transfer to Nurse Triage: Patient [phone] is asking to be seen sooner than 02/27/24 at 9:45 am with Jordan [name], patient is willing to see any other providers. Patient states symptoms severe chest congestion, shortness of breath, and wheezing. Patient denies no pain, nor fever. Please advise. Reason for Disposition  [1] MILD longstanding difficulty breathing (e.g., minimal/no SOB at rest, SOB with walking, pulse < 100) AND [2] SAME as normal  Answer Assessment - Initial Assessment Questions E2C2 Pulmonary Triage - Initial Assessment Questions Chief Complaint (e.g., cough, sob, wheezing, fever, chills, sweat or additional symptoms) *Go to specific symptom protocol after initial questions. SOB - worsening, productive white cough, fatigued Recent CT scan 10 days ago with severe problems in one of my lungs  How long have symptoms been present? 5-6 weeks  Have you tested for COVID or Flu? Note: If not, ask patient if a home test can be taken. If so, instruct patient to call back for positive results. No  MEDICINES:   Have you used any OTC meds to help with symptoms? Yes If yes, ask What medications? Mucinex Sudafed Allergy pill   Have you used your inhalers/maintenance medication? Yes If  yes, What medications? Albuterol  INH - 2 puffs 3x a day - provides temporary relief DuoNeb - at least once or twice a day Triager noted additional INH that was recently prescribed by PCP that pt has not picked up from pharmacy yet.  If inhaler, ask How many puffs and how often? Note: Review instructions on medication in the chart. See above  OXYGEN: Do you wear supplemental oxygen? No If yes, How many liters are you supposed to use? N/a Endorses wearing daughter CPAP machine - triager reinforced not using other's machine   Do you monitor your oxygen levels? Yes If yes, What is your reading (oxygen level) today? 96-97  What is your usual oxygen saturation reading?  (Note: Pulmonary O2 sats should be 90% or greater) 97 last time pt checked at PCP office     1. RESPIRATORY STATUS: Describe your breathing? (e.g., wheezing, shortness of breath, unable to speak, severe coughing)      See above 2. ONSET: When did this breathing problem begin?      See above 3. PATTERN Does the difficult breathing come and go, or has it been constant since it started?      constant 4. SEVERITY: How bad is your breathing? (e.g., mild, moderate, severe)      Mild to mod - mainly with exertion Triager does not appreciate audible SOB/wheezing during call. Pt is speaking in full sentences.  5. RECURRENT SYMPTOM: Have you had difficulty breathing before? If Yes, ask: When was the last time? and What happened that time?      unknown 6. CARDIAC HISTORY: Do you have any history of heart disease? (e.g., heart attack, angina, bypass surgery, angioplasty)  denies 7. LUNG HISTORY: Do you have any history of lung disease?  (e.g., pulmonary embolus, asthma, emphysema)     COPD by PCP 8. CAUSE: What do you think is causing the breathing problem?      Lung issue 9. OTHER SYMPTOMS: Do you have any other symptoms? (e.g., chest pain, cough, dizziness, fever, runny nose)      Occasional dizziness when standing that self resolves in a few seconds  10. O2 SATURATION MONITOR:  Do you use an oxygen saturation monitor (pulse oximeter) at home? If Yes, ask: What is your reading (oxygen level) today? What is your usual oxygen saturation reading? (e.g., 95%)       See above 11. PREGNANCY: Is there any chance you are pregnant? When was your last menstrual period?       N/a 12. TRAVEL: Have you traveled out of the country in the last month? (e.g., travel history, exposures)       N/a    Pt was transferred to NT d/t being symptomatic and wanting sooner F/U appt with [facility]. Pt reports productive white cough and worsening of sx for a few weeks. Triager unable to accommodate pt request at this time d/t lack of access and pt currently has soonest appt available. Pt has been added to the waitlist.  Triager will forward encounter for Jordan [name] 's office to review and see if accommodations can be made. Patient verbalized understanding and is expecting call back from office for next steps, if any.  Protocols used: Breathing Difficulty-A-AH

## 2024-01-07 NOTE — Telephone Encounter (Signed)
 Called and spoke with the pt  I offered ov with Candis on 01/20/24 and he refused, stating he only wants to see MD  I told him to keep appt with Dr. Meade and go to [facility] or PCP now  He verbalized understanding  Nothing further needed

## 2024-01-08 NOTE — Addendum Note (Signed)
 Addended by: Keifer Habib G on: 01/08/2024 09:04 PM   Modules accepted: Orders

## 2024-01-09 ENCOUNTER — Other Ambulatory Visit: Payer: Self-pay | Admitting: Pharmacist

## 2024-01-09 DIAGNOSIS — J411 Mucopurulent chronic bronchitis: Secondary | ICD-10-CM

## 2024-01-09 NOTE — Progress Notes (Signed)
 01/09/2024 Name: Jordan [name] MRN: [id number] DOB: 08-20-1945  No chief complaint on file.   Jordan [name] is a 78 y.o. year old male who presented for a telephone visit.   They were referred to the pharmacist by their PCP for assistance in managing medication access.    Subjective:  Patient has not been able tolerate Trelegy - he tried it for a few days and he felt like it was choking him.  He also has Wixela inhaler but has not started it because of the side effects he read - could cause pneumonia, blurry vision, blindness, cough. He states he does not want to take something that will make his breathing or cough worse or could cause pneumonia.   He is currently using albuterol  inhaler as needed He also uses nebulizer - ipratropium + albuterol  - 2 times per day and sodium chloride  solution in nebulizer 2 or 3 times per day.   He has use Breztri  inhaler in past and he preferred the MDI to the dry powder inhaler - Trelegy and Wixela. However Breztri  is not on his formulary.   Care Team: Primary Care Provider: Jesus Bernardino MATSU, MD ; Next Scheduled Visit: not currently scheduled Allergist - Dr Orlan Cramp; Next Scheduled Visit: 01/21/2024 Pulmonologist - Dr Isaiah in [place], [place]; Next Scheduled Visit (initial); 02/05/2024 Pulmonologist - Dr Meade; Next Scheduled Visit: 02/27/2024   Medication Access/Adherence  Current Pharmacy:  [id number] - [place], [place] - [address] [place] [address] Phone: [phone] Fax: [phone]   Patient reports affordability concerns with their medications: Yes  - Breztri  is not on his PartD formulary Patient reports access/transportation concerns to their pharmacy: No  Patient reports adherence concerns with their medications:  No       Objective:  No results found for: HGBA1C  Lab Results  Component Value Date   CREATININE 1.05 04/18/2023   BUN 26 (H) 04/18/2023    NA 142 04/18/2023   K 4.4 04/18/2023   CL 106 04/18/2023   CO2 29 04/18/2023    Lab Results  Component Value Date   CHOL 124 04/18/2023   HDL 45.30 04/18/2023   LDLCALC 60 04/18/2023   TRIG 92.0 04/18/2023   CHOLHDL 3 04/18/2023    Medications Reviewed Today     Reviewed by Carla Milling, RPH-CPP (Pharmacist) on 01/09/24 at 1136  Med List Status: <None>   Medication Order Taking? Sig Documenting Provider Last Dose Status Informant  albuterol  (VENTOLIN  HFA) 108 (90 Base) MCG/ACT inhaler [id number] Yes Inhale 2 puffs into the lungs every 6 (six) hours as needed for wheezing or shortness of breath. Jesus Bernardino MATSU, MD  Active   Famotidine-Ca Carb-Mag Hydrox (PEPCID COMPLETE PO) [id number] Yes Take by mouth daily. [provider]  Active   fluticasone -salmeterol (ADVAIR) 250-50 MCG/ACT AEPB [id number]  Inhale 1 puff into the lungs in the morning and at bedtime.  Patient not taking: Reported on 01/09/2024   Jesus Bernardino MATSU, MD  Active   Fluticasone -Umeclidin-Vilant (TRELEGY ELLIPTA ) 100-62.5-25 MCG/ACT AEPB [id number]  Inhale 1 puff into the lungs daily.  Patient not taking: Reported on 01/09/2024   Jesus Bernardino MATSU, MD  Active   HYDROcodone -acetaminophen  (NORCO/VICODIN) 5-325 MG tablet [id number]  Take 1 tablet by mouth every 6 (six) hours as needed for moderate pain (pain score 4-6) or severe pain (pain score 7-10) (do not drive while taking, do not mix with other sedatives such as  etoh.). Jesus Bernardino MATSU, MD  Active   ibuprofen (ADVIL) 200 MG tablet [id number] Yes Take 200 mg by mouth 4 (four) times daily. [provider]  Active Self  ipratropium-albuterol  (DUONEB) 0.5-2.5 (3) MG/3ML SOLN [id number] Yes Take 3 mLs by nebulization every 6 (six) hours as needed. Job Lukes, [place]  Active    Patient not taking:   Discontinued 01/09/24 1134 (Completed Course)   pantoprazole  (PROTONIX ) 40 MG tablet [id number] Yes Take 1 tablet (40 mg total) by mouth daily. Jesus Bernardino MATSU, MD   Active    Patient not taking:   Discontinued 01/09/24 1135 (Completed Course)   saw palmetto 500 MG capsule [id number] Yes Take 450 mg by mouth daily. [provider]  Active   sildenafil (VIAGRA) 100 MG tablet [id number] Yes  [provider]  Active   sodium chloride  0.9 % nebulizer solution [id number] Yes Take 3 mLs by nebulization as needed for wheezing. Jesus Bernardino MATSU, MD  Active               Assessment/Plan:   Medication Management: - Reviewed his 2025 [employer] PartD formulary  Tier 2 medications are $10 / 30 days - Wixela and generic Advair discus (fluticasone  + salmeterol) patient does not like the dry powder inhalers - makes him feel like he is choking. He also has declined to use Wixela due to possible side effects in package insert.   Tier 3 medications are 19% of medication cost - Breo, generic Symbicort , Airsupra  and Trelegy. Of these generic Symbicort  and Airsupra  are available as MDI.   Tier 4 medications are 32% of medication cost - budesonide  nebs, generic Advair HFA or Dulera. Generic Advair is or Dulera are MDI.    - Sent prior authorization for Breztri  thru Cover My meds (Key: [id number]) but I am doubtful that it will be approved. They wanted to know if patient has tried and failed or could not take the following - Ranell, Trelelgy, Napoleon, generic Symbicort  or Dulera. Since only Symbicort  and Dulera are MDIs all the others would be contraindicated due to past difficulties with dry powder inhalers.  - Checked to see if we had any samples for Breztri  to last him until he sees pulmonologist 02/05/2024 and since he had taken in past with some success but no samples currently available. - Will check with Dr Jesus about next steps.  - Discussed that the side effects that he is concerned with regarding Wixela inhaler are rare and pneumonia and coughing are  more likely to occur in patients with uncontrolled pulmonary disease.  - For now continue to use  nebulized - ipratropium + albuterol  2 times per day and sodium chloride  2 to 3 times per day.   Follow Up Plan: 1-2 weeks  Madelin Ray, PharmD Clinical Pharmacist [facility]  Population Health [phone]

## 2024-01-10 ENCOUNTER — Telehealth: Payer: Self-pay

## 2024-01-10 NOTE — Telephone Encounter (Signed)
 Pharmacy Patient Advocate Encounter  Received notification from [employer] that Prior Authorization for Breztri  Inhaler has been APPROVED from 06/19/23 to 01/08/25   PA #/Case ID/Reference #: [id number]

## 2024-01-16 ENCOUNTER — Encounter: Payer: Self-pay | Admitting: Internal Medicine

## 2024-01-16 DIAGNOSIS — Z7729 Contact with and (suspected ) exposure to other hazardous substances: Secondary | ICD-10-CM

## 2024-01-16 DIAGNOSIS — Z77098 Contact with and (suspected) exposure to other hazardous, chiefly nonmedicinal, chemicals: Secondary | ICD-10-CM

## 2024-01-18 ENCOUNTER — Other Ambulatory Visit: Payer: Self-pay | Admitting: Internal Medicine

## 2024-01-18 DIAGNOSIS — Z77098 Contact with and (suspected) exposure to other hazardous, chiefly nonmedicinal, chemicals: Secondary | ICD-10-CM | POA: Insufficient documentation

## 2024-01-18 DIAGNOSIS — J42 Unspecified chronic bronchitis: Secondary | ICD-10-CM

## 2024-01-18 DIAGNOSIS — Z7729 Contact with and (suspected ) exposure to other hazardous substances: Secondary | ICD-10-CM | POA: Insufficient documentation

## 2024-01-18 NOTE — Progress Notes (Unsigned)
 Patient has follow up with me in September. Can we try to get him a full pft before then so I can review at the appointment?

## 2024-01-20 NOTE — Progress Notes (Addendum)
 Called patient.  Reviewed information.  Ordered PFT.  Patient has full PFT scheduled for 01/22/2024.  Patient has been informed by the front desk schedulers.

## 2024-01-20 NOTE — Progress Notes (Unsigned)
 New Patient Note  RE: Jordan [name] MRN: [id number] DOB: [birth year] Date of Office Visit: 01/21/2024  Consult requested by: Jesus Bernardino MATSU, MD Primary care provider: Jesus Bernardino MATSU, MD  Chief Complaint: No chief complaint on file.  History of Present Illness: I had the pleasure of seeing Jordan [name] for initial evaluation at the [facility] on 01/21/2024. He is a 78 y.o. male, who is referred here by Jesus Bernardino MATSU, MD for the evaluation of ***.  Discussed the use of AI scribe software for clinical note transcription with the patient, who gave verbal consent to proceed.  History of Present Illness             08/22/2022 pulmonology visit: Assessment:  GERD Esophageal cancer s/p esophagectomy in late 1990s   Plan/Recommendations:   Mr. Jordan [name] has symptoms of reflux and lower micro-aspiration occasionally causing respiratory episodes.    We discussed disease management and progression at length today regarding reflux management in the setting of his esophagectomy. Most of his treatment at this point is lifestyle modification given his altered upper GI anatomy. We discussed avoiding trigger foods, sleeping up right with wedge pillow or bed risers.    If future episodes would probably just treat with prednisone  for inflammation as these are not likely to require antibiotics   Continue H2 blocker and PPI for now if they are efficacious.   Follow up with GI if no resolution.    He doesn't appeared to have developed significant respiratory symptoms on a daily basis from these episodes.  Assessment and Plan: Jordan [name] is a 78 y.o. male with: ***  Assessment and Plan               No follow-ups on file.  No orders of the defined types were placed in this encounter.  Lab Orders  No laboratory test(s) ordered today    Other allergy screening: Asthma: {Blank single:19197::yes,no} Rhino conjunctivitis: {Blank single:19197::yes,no} Food  allergy: {Blank single:19197::yes,no} Medication allergy: {Blank single:19197::yes,no} Hymenoptera allergy: {Blank single:19197::yes,no} Urticaria: {Blank single:19197::yes,no} Eczema:{Blank single:19197::yes,no} History of recurrent infections suggestive of immunodeficency: {Blank single:19197::yes,no}  Diagnostics: Spirometry:  Tracings reviewed. His effort: {Blank single:19197::Good reproducible efforts.,It was hard to get consistent efforts and there is a question as to whether this reflects a maximal maneuver.,Poor effort, data can not be interpreted.} FVC: ***L FEV1: ***L, ***% predicted FEV1/FVC ratio: ***% Interpretation: {Blank single:19197::Spirometry consistent with mild obstructive disease,Spirometry consistent with moderate obstructive disease,Spirometry consistent with severe obstructive disease,Spirometry consistent with possible restrictive disease,Spirometry consistent with mixed obstructive and restrictive disease,Spirometry uninterpretable due to technique,Spirometry consistent with normal pattern,No overt abnormalities noted given today's efforts}.  Please see scanned spirometry results for details.  Skin Testing: {Blank single:19197::Select foods,Environmental allergy panel,Environmental allergy panel and select foods,Food allergy panel,None,Deferred due to recent antihistamines use}. *** Results discussed with patient/family.   Past Medical History: Patient Active Problem List   Diagnosis Date Noted   Suspected exposure hazardous substances 01/18/2024   Occupational exposure to chemical pollution 01/18/2024   Chronic anemia 04/19/2023   Arthritis of carpometacarpal [facility]) joint of left thumb 04/15/2023   Hiatal hernia 09/11/2022   Peripheral polyneuropathy 08/13/2022   Dysphagia 07/30/2022   Dizziness 07/30/2022   Stricture of esophagus 07/30/2022   H/O esophagectomy 07/30/2022   Mechanical complication  of esophagostomy (HCC) 07/30/2022   Insomnia 07/30/2022   PSA elevation 07/30/2022   BPH (benign prostatic hyperplasia) 07/30/2022   Restless legs syndrome 07/30/2022   Hyperlipidemia, acquired 07/30/2022   Hypertension  12/11/2021   Displacement of lumbar intervertebral disc without myelopathy 07/24/2021   Hypertensive retinopathy 07/24/2021   Lower urinary tract symptoms due to benign prostatic hyperplasia 07/24/2021   Mixed hyperlipidemia 07/24/2021   Chronic bronchitis (HCC) 07/24/2021   GERD (gastroesophageal reflux disease) 11/11/2018   Back pain at L4-L5 level 07/23/2016   Allergies 06/18/2009   Past Medical History:  Diagnosis Date   Allergy    Arthritis    back, shoulder- both    Barrett's esophagus    Barrett's esophagus with dysplasia 07/24/2021   BPH (benign prostatic hyperplasia)    Chronic lower back pain    Herniated disc at L4   Chronic pain 07/24/2021   Chronic rhinitis    Chronic sinusitis 11/11/2018   ED (erectile dysfunction) of organic origin    GERD (gastroesophageal reflux disease)    Barrett's Esophagus, treated /w esophagectomy - 1998, continues toi have reflux & uses Zantac on a regular basis    Hemoptysis 11/11/2018   History of wheezing 11/11/2018   Hypertension 10/20/21   Neuromuscular disorder (HCC) 2011   Tingling feet   Neuropathy 06/18/2016   Pneumonitis due to inhalation of food or vomitus (HCC) 07/30/2022   Thyroid  disease    Tinnitus    hearing loss in both ears    Urinary tract infectious disease 08/13/2022   Past Surgical History: Past Surgical History:  Procedure Laterality Date   CATARACT EXTRACTION Bilateral 2019   Dr. Lelon   ESOPHAGECTOMY     1998- at [facility], /w epidural & sedation & pain management /w continued  epidural    EYE SURGERY  2018   Cataracts   HERNIA REPAIR     LUMBAR LAMINECTOMY/DECOMPRESSION MICRODISCECTOMY  03/22/2012   Procedure: LUMBAR LAMINECTOMY/DECOMPRESSION MICRODISCECTOMY 1 LEVEL;  Surgeon: Alm GORMAN Molt, MD;  Location: [facility];  Service: Neurosurgery;  Laterality: Right;  right lumbar four-five extra-foraminal microdiscectomy   SPINE SURGERY  2013   Ruptured disc L4   Medication List:  Current Outpatient Medications  Medication Sig Dispense Refill   albuterol  (VENTOLIN  HFA) 108 (90 Base) MCG/ACT inhaler Inhale 2 puffs into the lungs every 6 (six) hours as needed for wheezing or shortness of breath. 1 each 1   Famotidine-Ca Carb-Mag Hydrox (PEPCID COMPLETE PO) Take by mouth daily.     fluticasone -salmeterol (ADVAIR) 250-50 MCG/ACT AEPB Inhale 1 puff into the lungs in the morning and at bedtime. (Patient not taking: Reported on 01/09/2024) 60 each 3   Fluticasone -Umeclidin-Vilant (TRELEGY ELLIPTA ) 100-62.5-25 MCG/ACT AEPB Inhale 1 puff into the lungs daily. (Patient not taking: Reported on 01/09/2024) 1 each 11   HYDROcodone -acetaminophen  (NORCO/VICODIN) 5-325 MG tablet Take 1 tablet by mouth every 6 (six) hours as needed for moderate pain (pain score 4-6) or severe pain (pain score 7-10) (do not drive while taking, do not mix with other sedatives such as etoh.). 45 tablet 0   ibuprofen (ADVIL) 200 MG tablet Take 200 mg by mouth 4 (four) times daily.     ipratropium-albuterol  (DUONEB) 0.5-2.5 (3) MG/3ML SOLN Take 3 mLs by nebulization every 6 (six) hours as needed. 270 mL 1   pantoprazole  (PROTONIX ) 40 MG tablet Take 1 tablet (40 mg total) by mouth daily. 90 tablet 3   saw palmetto 500 MG capsule Take 450 mg by mouth daily.     sildenafil (VIAGRA) 100 MG tablet      sodium chloride  0.9 % nebulizer solution Take 3 mLs by nebulization as needed for wheezing. 90 mL 3  No current facility-administered medications for this visit.   Allergies: Allergies  Allergen Reactions   Latex    Adhesive [Tape] Rash   Social History: Social History   Socioeconomic History   Marital status: Married    Spouse name: Cornelia Flammia   Number of children: 4   Years of education: Not on file    Highest education level: Bachelor's degree (e.g., BA, AB, BS)  Occupational History   Occupation: Art gallery manager    Comment: Retired  Tobacco Use   Smoking status: Never   Smokeless tobacco: Never   Tobacco comments:    Never smoked  Vaping Use   Vaping status: Never Used  Substance and Sexual Activity   Alcohol use: Not Currently   Drug use: No   Sexual activity: Yes    Birth control/protection: Surgical, None  Other Topics Concern   Not on file  Social History Narrative   Married father of 4: 3 girls and 1 boy.   Wife's name is Hospital doctor-   Retired Art gallery manager      No routine exercise.   1-2 cups coffee a day.   2-3 beers a week   Social Drivers of Corporate investment banker Strain: Low Risk  (10/15/2023)   Overall Financial Resource Strain (CARDIA)    Difficulty of Paying Living Expenses: Not hard at all  Food Insecurity: No Food Insecurity (10/15/2023)   Hunger Vital Sign    Worried About Running Out of Food in the Last Year: Never true    Ran Out of Food in the Last Year: Never true  Transportation Needs: No Transportation Needs (10/15/2023)   PRAPARE - Administrator, Civil Service (Medical): No    Lack of Transportation (Non-Medical): No  Physical Activity: Sufficiently Active (10/15/2023)   Exercise Vital Sign    Days of Exercise per Week: 3 days    Minutes of Exercise per Session: 150+ min  Stress: No Stress Concern Present (10/15/2023)   [employer] of Occupational Health - Occupational Stress Questionnaire    Feeling of Stress : Not at all  Social Connections: Moderately Integrated (10/15/2023)   Social Connection and Isolation Panel    Frequency of Communication with Friends and Family: Three times a week    Frequency of Social Gatherings with Friends and Family: More than three times a week    Attends Religious Services: More than 4 times per year    Active Member of [employer] or Organizations: No    Attends Banker  Meetings: Never    Marital Status: Married   Lives in a ***. Smoking: *** Occupation: ***  Environmental HistorySurveyor, minerals in the house: Network engineer in the family room: {Blank single:19197::yes,no} Carpet in the bedroom: {Blank single:19197::yes,no} Heating: {Blank single:19197::electric,gas,heat pump} Cooling: {Blank single:19197::central,window,heat pump} Pet: {Blank single:19197::yes ***,no}  Family History: Family History  Problem Relation Age of Onset   Hypertension Mother    Alcohol abuse Mother    Arthritis Mother    Cancer Mother    Hypertension Father    Cancer Father    Heart attack Father    Alcohol abuse Father    Stroke Father    Allergic rhinitis Neg Hx    Asthma Neg Hx    Eczema Neg Hx    Urticaria Neg Hx    Problem  Relation Asthma                                   *** Eczema                                *** Food allergy                          *** Allergic rhino conjunctivitis     ***  Review of Systems  Constitutional:  Negative for appetite change, chills, fever and unexpected weight change.  HENT:  Negative for congestion and rhinorrhea.   Eyes:  Negative for itching.  Respiratory:  Negative for cough, chest tightness, shortness of breath and wheezing.   Cardiovascular:  Negative for chest pain.  Gastrointestinal:  Negative for abdominal pain.  Genitourinary:  Negative for difficulty urinating.  Skin:  Negative for rash.  Neurological:  Negative for headaches.    Objective: There were no vitals taken for this visit. There is no height or weight on file to calculate BMI. Physical Exam Vitals and nursing note reviewed.  Constitutional:      Appearance: Normal appearance. He is well-developed.  HENT:     Head: Normocephalic and atraumatic.     Right Ear: Tympanic membrane and external ear normal.     Left Ear: Tympanic membrane and external ear  normal.     Nose: Nose normal.     Mouth/Throat:     Mouth: Mucous membranes are moist.     Pharynx: Oropharynx is clear.  Eyes:     Conjunctiva/sclera: Conjunctivae normal.  Cardiovascular:     Rate and Rhythm: Normal rate and regular rhythm.     Heart sounds: Normal heart sounds. No murmur heard.    No friction rub. No gallop.  Pulmonary:     Effort: Pulmonary effort is normal.     Breath sounds: Normal breath sounds. No wheezing, rhonchi or rales.  Musculoskeletal:     Cervical back: Neck supple.  Skin:    General: Skin is warm.     Findings: No rash.  Neurological:     Mental Status: He is alert and oriented to person, place, and time.  Psychiatric:        Behavior: Behavior normal.    The plan was reviewed with the patient/family, and all questions/concerned were addressed.  It was my pleasure to see Kitt today and participate in his care. Please feel free to contact me with any questions or concerns.  Sincerely,  Orlan Cramp, DO Allergy & Immunology  [facility]  [facility] office: [phone] [facility] office: [phone]

## 2024-01-21 ENCOUNTER — Telehealth: Payer: Self-pay | Admitting: Allergy

## 2024-01-21 ENCOUNTER — Encounter: Payer: Self-pay | Admitting: Allergy

## 2024-01-21 ENCOUNTER — Encounter: Payer: Self-pay | Admitting: Internal Medicine

## 2024-01-21 ENCOUNTER — Ambulatory Visit (INDEPENDENT_AMBULATORY_CARE_PROVIDER_SITE_OTHER): Admitting: Allergy

## 2024-01-21 VITALS — BP 144/76 | HR 58 | Temp 98.0°F | Resp 16 | Ht 66.5 in | Wt 171.2 lb

## 2024-01-21 DIAGNOSIS — J310 Chronic rhinitis: Secondary | ICD-10-CM | POA: Diagnosis not present

## 2024-01-21 DIAGNOSIS — K219 Gastro-esophageal reflux disease without esophagitis: Secondary | ICD-10-CM | POA: Diagnosis not present

## 2024-01-21 DIAGNOSIS — Z77098 Contact with and (suspected) exposure to other hazardous, chiefly nonmedicinal, chemicals: Secondary | ICD-10-CM

## 2024-01-21 DIAGNOSIS — B999 Unspecified infectious disease: Secondary | ICD-10-CM

## 2024-01-21 DIAGNOSIS — R053 Chronic cough: Secondary | ICD-10-CM | POA: Diagnosis not present

## 2024-01-21 DIAGNOSIS — K9433 Esophagostomy malfunction: Secondary | ICD-10-CM

## 2024-01-21 DIAGNOSIS — J411 Mucopurulent chronic bronchitis: Secondary | ICD-10-CM

## 2024-01-21 DIAGNOSIS — Z7729 Contact with and (suspected ) exposure to other hazardous substances: Secondary | ICD-10-CM

## 2024-01-21 MED ORDER — IPRATROPIUM BROMIDE 0.06 % NA SOLN: 1.0000 | Freq: Three times a day (TID) | NASAL | 3 refills | Status: DC | PRN

## 2024-01-21 MED ORDER — MOMETASONE FUROATE 50 MCG/ACT NA SUSP: 1.0000 | Freq: Every day | NASAL | 3 refills | Status: AC

## 2024-01-21 MED ORDER — BREZTRI AEROSPHERE 160-9-4.8 MCG/ACT IN AERO: 2.0000 | INHALATION_SPRAY | Freq: Two times a day (BID) | RESPIRATORY_TRACT | 11 refills | Status: DC

## 2024-01-21 NOTE — Patient Instructions (Addendum)
 Rhinitis Use Nasacort (triamcinolone) nasal spray 1-2 sprays per nostril once a day as needed for nasal congestion.  Use Atrovent  (ipratropium) 0.06% 1-2 sprays per nostril three a day as needed for runny nose/drainage. Refer to ENT to rule out anatomical issues.  Stop benzedrex and sudafed. You can get rebound symptoms.   Get bloodwork We are ordering labs, so please allow 1-2 weeks for the results to come back. With the newly implemented Cures Act, the labs might be visible to you at the same time that they become visible to me. However, I will not address the results until all of the results are back, so please be patient.  In the meantime, continue recommendations in your patient instructions, including avoidance measures (if applicable), until you hear from me.  Infections Keep track of infections and antibiotics use. Get bloodwork next to look at immune system.   Breathing Get PFT tomorrow. Follow up with pulmonology as scheduled.  Continue medications as before.   Reflux See handout for lifestyle and dietary modifications. Continue pantoprazole  40mg  once day and Pepcid daily as before.   Aspirations  Monitor symptoms.  Follow up in 3 months or sooner if needed.

## 2024-01-21 NOTE — Telephone Encounter (Signed)
 Please place referral to ENT for chronic rhinitis.  Thank you.

## 2024-01-22 ENCOUNTER — Ambulatory Visit

## 2024-01-22 DIAGNOSIS — J42 Unspecified chronic bronchitis: Secondary | ICD-10-CM

## 2024-01-22 LAB — PULMONARY FUNCTION TEST
DL/VA % pred: 79 %
DL/VA: 3.16 ml/min/mmHg/L
DLCO unc % pred: 87 %
DLCO unc: 19.43 ml/min/mmHg
FEF 25-75 Post: 1.72 L/s
FEF 25-75 Pre: 1.69 L/s
FEF2575-%Change-Post: 2 %
FEF2575-%Pred-Post: 95 %
FEF2575-%Pred-Pre: 93 %
FEV1-%Change-Post: 3 %
FEV1-%Pred-Post: 102 %
FEV1-%Pred-Pre: 99 %
FEV1-Post: 2.63 L
FEV1-Pre: 2.54 L
FEV1FVC-%Change-Post: 2 %
FEV1FVC-%Pred-Pre: 91 %
FEV6-%Change-Post: 0 %
FEV6-%Pred-Post: 114 %
FEV6-%Pred-Pre: 113 %
FEV6-Post: 3.8 L
FEV6-Pre: 3.77 L
FEV6FVC-%Change-Post: 0 %
FEV6FVC-%Pred-Post: 106 %
FEV6FVC-%Pred-Pre: 105 %
FVC-%Change-Post: 0 %
FVC-%Pred-Post: 107 %
FVC-%Pred-Pre: 107 %
FVC-Post: 3.86 L
FVC-Pre: 3.83 L
Post FEV1/FVC ratio: 68 %
Post FEV6/FVC ratio: 99 %
Pre FEV1/FVC ratio: 66 %
Pre FEV6/FVC Ratio: 98 %
RV % pred: 144 %
RV: 3.48 L
TLC % pred: 113 %
TLC: 7.21 L

## 2024-01-22 NOTE — Patient Instructions (Signed)
 Full pft performed today.

## 2024-01-22 NOTE — Progress Notes (Signed)
 Full pft performed today.

## 2024-01-24 ENCOUNTER — Encounter (INDEPENDENT_AMBULATORY_CARE_PROVIDER_SITE_OTHER): Payer: Self-pay

## 2024-01-24 DIAGNOSIS — B999 Unspecified infectious disease: Secondary | ICD-10-CM | POA: Diagnosis not present

## 2024-01-24 DIAGNOSIS — J310 Chronic rhinitis: Secondary | ICD-10-CM | POA: Diagnosis not present

## 2024-02-04 ENCOUNTER — Ambulatory Visit: Payer: Self-pay | Admitting: Allergy

## 2024-02-04 LAB — STREP PNEUMONIAE 23 SEROTYPES IGG
Pneumo Ab Type 1*: 0.5 ug/mL — ABNORMAL LOW (ref >1.3)
Pneumo Ab Type 12 (12F)*: <0.1 ug/mL — ABNORMAL LOW (ref >1.3)
Pneumo Ab Type 14*: 0.5 ug/mL — ABNORMAL LOW (ref >1.3)
Pneumo Ab Type 17 (17F)*: 0.5 ug/mL — ABNORMAL LOW (ref >1.3)
Pneumo Ab Type 19 (19F)*: 1.5 ug/mL (ref >1.3)
Pneumo Ab Type 2*: <0.2 ug/mL — ABNORMAL LOW (ref >1.3)
Pneumo Ab Type 20*: 0.5 ug/mL — ABNORMAL LOW (ref >1.3)
Pneumo Ab Type 22 (22F)*: 0.2 ug/mL — ABNORMAL LOW (ref >1.3)
Pneumo Ab Type 23 (23F)*: 0.8 ug/mL — ABNORMAL LOW (ref >1.3)
Pneumo Ab Type 26 (6B)*: <0.1 ug/mL — ABNORMAL LOW (ref >1.3)
Pneumo Ab Type 3*: <0.1 ug/mL — ABNORMAL LOW (ref >1.3)
Pneumo Ab Type 34 (10A)*: 1 ug/mL — ABNORMAL LOW (ref >1.3)
Pneumo Ab Type 4*: 0.4 ug/mL — ABNORMAL LOW (ref >1.3)
Pneumo Ab Type 43 (11A)*: 1.1 ug/mL — ABNORMAL LOW (ref >1.3)
Pneumo Ab Type 5*: 1 ug/mL — ABNORMAL LOW (ref >1.3)
Pneumo Ab Type 51 (7F)*: 0.3 ug/mL — ABNORMAL LOW (ref >1.3)
Pneumo Ab Type 54 (15B)*: 1.3 ug/mL — ABNORMAL LOW (ref >1.3)
Pneumo Ab Type 56 (18C)*: 0.1 ug/mL — ABNORMAL LOW (ref >1.3)
Pneumo Ab Type 57 (19A)*: 4.2 ug/mL (ref >1.3)
Pneumo Ab Type 68 (9V)*: 1.1 ug/mL — ABNORMAL LOW (ref >1.3)
Pneumo Ab Type 70 (33F)*: 0.2 ug/mL — ABNORMAL LOW (ref >1.3)
Pneumo Ab Type 8*: 15.1 ug/mL (ref >1.3)
Pneumo Ab Type 9 (9N)*: 1.8 ug/mL (ref >1.3)

## 2024-02-04 LAB — ALLERGENS W/TOTAL IGE AREA 2

## 2024-02-04 LAB — CBC WITH DIFFERENTIAL/PLATELET
Basophils Absolute: 0 x10E3/uL (ref 0.0–0.2)
Basos: 1 %
EOS (ABSOLUTE): 0.5 x10E3/uL — ABNORMAL HIGH (ref 0.0–0.4)
Eos: 8 %
Hematocrit: 39.4 % (ref 37.5–51.0)
Hemoglobin: 13 g/dL (ref 13.0–17.7)
Immature Grans (Abs): 0 x10E3/uL (ref 0.0–0.1)
Immature Granulocytes: 0 %
Lymphocytes Absolute: 1.6 x10E3/uL (ref 0.7–3.1)
Lymphs: 24 %
MCH: 29.9 pg (ref 26.6–33.0)
MCHC: 33 g/dL (ref 31.5–35.7)
MCV: 91 fL (ref 79–97)
Monocytes Absolute: 0.5 x10E3/uL (ref 0.1–0.9)
Monocytes: 7 %
Neutrophils Absolute: 4.1 x10E3/uL (ref 1.4–7.0)
Neutrophils: 60 %
Platelets: 211 x10E3/uL (ref 150–450)
RBC: 4.35 x10E6/uL (ref 4.14–5.80)
RDW: 13.9 % (ref 11.6–15.4)
WBC: 6.8 x10E3/uL (ref 3.4–10.8)

## 2024-02-04 LAB — IGG, IGA, IGM
IgA/Immunoglobulin A, Serum: 180 mg/dL (ref 61–437)
IgG (Immunoglobin G), Serum: 481 mg/dL — ABNORMAL LOW (ref 603–1613)
IgM (Immunoglobulin M), Srm: 25 mg/dL (ref 15–143)

## 2024-02-04 LAB — DIPHTHERIA / TETANUS ANTIBODY PANEL
Diphtheria Ab: 0.21 [IU]/mL (ref <0.10)
Tetanus Ab, IgG: 0.9 [IU]/mL (ref <0.10)

## 2024-02-04 LAB — COMPLEMENT, TOTAL: Compl, Total (CH50): 50 U/mL (ref >41)

## 2024-02-05 ENCOUNTER — Encounter: Payer: Self-pay | Admitting: Internal Medicine

## 2024-02-05 ENCOUNTER — Ambulatory Visit (INDEPENDENT_AMBULATORY_CARE_PROVIDER_SITE_OTHER): Admitting: Internal Medicine

## 2024-02-05 VITALS — BP 140/80 | HR 87 | Temp 98.0°F | Ht 66.0 in | Wt 170.8 lb

## 2024-02-05 DIAGNOSIS — R9389 Abnormal findings on diagnostic imaging of other specified body structures: Secondary | ICD-10-CM

## 2024-02-05 DIAGNOSIS — J441 Chronic obstructive pulmonary disease with (acute) exacerbation: Secondary | ICD-10-CM | POA: Diagnosis not present

## 2024-02-05 MED ORDER — FLUTICASONE-SALMETEROL 230-21 MCG/ACT IN AERO: 2.0000 | INHALATION_SPRAY | Freq: Two times a day (BID) | RESPIRATORY_TRACT | 12 refills | Status: AC

## 2024-02-05 MED ORDER — IPRATROPIUM-ALBUTEROL 0.5-2.5 (3) MG/3ML IN SOLN
3.0000 mL | Freq: Once | RESPIRATORY_TRACT | Status: AC
  Administered 2024-02-05: 3 mL via RESPIRATORY_TRACT

## 2024-02-05 MED ORDER — AZITHROMYCIN 250 MG PO TABS: ORAL_TABLET | ORAL | 0 refills | Status: DC

## 2024-02-05 MED ORDER — PREDNISONE 20 MG PO TABS: 20.0000 mg | ORAL_TABLET | Freq: Every day | ORAL | 1 refills | Status: DC

## 2024-02-05 MED ORDER — METHYLPREDNISOLONE ACETATE 80 MG/ML IJ SUSP
80.0000 mg | Freq: Once | INTRAMUSCULAR | Status: AC
  Administered 2024-02-05: 80 mg via INTRAMUSCULAR

## 2024-02-05 NOTE — Patient Instructions (Addendum)
 Plan today is to Give NEBULIZER in the office We will also give you a steroid injection in the office today  At home, start Prednisone  20 mg daily for 10 days And start Z pak antibiotics  We reviewed your CT scan of your chest, there is evidence of infection and inflammation.  We will plan to repeat your CT chest in 2 months  Start Advair FHA 2 puffs in AM and 2 puffs in PM Please rinse mouth after every use   Avoid Allergens and Irritants Avoid secondhand smoke Avoid SICK contacts Recommend  Masking  when appropriate Recommend Keep up-to-date with vaccinations

## 2024-02-05 NOTE — Progress Notes (Signed)
 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 No focal infiltrate or sizable effusion is seen. Apical pleural and parenchymal scarring is again seen on the right. Scattered scarring is noted in the right lower lobe stable in appearance from the prior exam. A new ground-glass opacity is noted within the inferior right upper lobe This measures approximately 2.4 cm in mean diameter. No solid component is noted.   PAST MEDICAL HISTORY   Past Medical History:  Diagnosis Date   Allergy    Arthritis    back, shoulder- both    Barrett's esophagus    Barrett's esophagus with dysplasia 07/24/2021   BPH (benign prostatic hyperplasia)    Chronic lower back pain    Herniated disc at L4   Chronic pain 07/24/2021   Chronic rhinitis    Chronic sinusitis 11/11/2018   ED (erectile dysfunction) of organic origin    GERD (gastroesophageal reflux disease)    Barrett's Esophagus, treated /w esophagectomy - 1998, continues toi have reflux & uses Zantac  on a regular basis    Hemoptysis 11/11/2018   History of wheezing 11/11/2018   Hypertension 10/20/21   Neuromuscular disorder (HCC) 2011   Tingling feet   Neuropathy 06/18/2016   Pneumonitis due to inhalation of food or vomitus (HCC) 07/30/2022   Thyroid  disease    Tinnitus    hearing loss in both ears    Urinary tract infectious disease 08/13/2022     SURGICAL HISTORY   Past Surgical History:  Procedure Laterality Date   CATARACT EXTRACTION Bilateral 2019   Dr. Lelon   ESOPHAGECTOMY     1998- at [facility], /w epidural & sedation & pain management /w continued  epidural    EYE SURGERY  2018   Cataracts   HERNIA REPAIR     LUMBAR LAMINECTOMY/DECOMPRESSION MICRODISCECTOMY  03/22/2012   Procedure: LUMBAR LAMINECTOMY/DECOMPRESSION MICRODISCECTOMY 1 LEVEL;  Surgeon: Alm GORMAN Molt, MD;  Location: [facility];  Service: Neurosurgery;  Laterality: Right;  right lumbar four-five extra-foraminal microdiscectomy   SPINE SURGERY  2013   Ruptured disc L4     FAMILY HISTORY   Family History  Problem Relation Age of Onset   Hypertension Mother    Alcohol abuse Mother    Arthritis Mother    Cancer Mother    Hypertension Father    Cancer Father    Heart attack Father    Alcohol abuse Father    Stroke Father    Allergic rhinitis Neg Hx    Asthma Neg Hx    Eczema Neg Hx  Urticaria Neg Hx      SOCIAL HISTORY   Social History   Tobacco Use   Smoking status: Never   Smokeless tobacco: Never   Tobacco comments:    Never smoked  Vaping Use   Vaping status: Never Used  Substance Use Topics   Alcohol use: Not Currently   Drug use: No     MEDICATIONS    Home Medication:  Current Outpatient Rx   Order #: [id number] Class: Normal   Order #: [id number] Class: Historical Med   Order #: [id number] Class: Historical Med   Order #: [id number] Class: Historical Med   Order #: [id number] Class: Historical Med   Order #: [id number] Class: Historical Med   Order #: [id number] Class: Normal    Order #: [id number] Class: Historical Med   Order #: [id number] Class: Normal   Order #: [id number] Class: Normal   Order #: [id number] Class: Historical Med   Order #: [id number] Class: Normal   Order #: [id number] Class: Normal   Order #: [id number] Class: Historical Med   Order #: [id number] Class: Historical Med   Order #: [id number] Class: Normal   Order #: [id number] Class: Historical Med    Current Medication:  Current Outpatient Medications:    albuterol  (VENTOLIN  HFA) 108 (90 Base) MCG/ACT inhaler, Inhale 2 puffs into the lungs every 6 (six) hours as needed for wheezing or shortness of breath., Disp: 1 each, Rfl: 1   Calcium Carbonate Antacid (TUMS PO), Take 2 tablets by mouth as needed., Disp: , Rfl:    Chlorpheniramine Maleate (ALLERGY RELIEF PO), Take 10 mg by mouth daily., Disp: , Rfl:    Cholecalciferol (VITAMIN D3 PO), Take 25 mcg by mouth daily., Disp: , Rfl:    Cyanocobalamin  (VITAMIN B-12 PO), Take by mouth daily., Disp: , Rfl:    Famotidine-Ca Carb-Mag Hydrox (PEPCID COMPLETE PO), Take by mouth daily., Disp: , Rfl:    HYDROcodone -acetaminophen  (NORCO/VICODIN) 5-325 MG tablet, Take 1 tablet by mouth every 6 (six) hours as needed for moderate pain (pain score 4-6) or severe pain (pain score 7-10) (do not drive while taking, do not mix with other sedatives such as etoh.)., Disp: 45 tablet, Rfl: 0   ibuprofen (ADVIL) 200 MG tablet, Take 200 mg by mouth 4 (four) times daily., Disp: , Rfl:    ipratropium (ATROVENT ) 0.06 % nasal spray, Place 1-2 sprays into both nostrils 3 (three) times daily as needed (runny nose/drainage)., Disp: 15 mL, Rfl: 3   ipratropium-albuterol  (DUONEB) 0.5-2.5 (3) MG/3ML SOLN, Take 3 mLs by nebulization every 6 (six) hours as needed., Disp: 270 mL, Rfl: 1   MAGNESIUM PO, Take 400 mg by mouth daily., Disp: , Rfl:    mometasone  (NASONEX ) 50 MCG/ACT nasal spray, Place 1-2 sprays into the nose daily. For nasal congestion, Disp: 1 each, Rfl: 3   pantoprazole  (PROTONIX ) 40 MG  tablet, Take 1 tablet (40 mg total) by mouth daily., Disp: 90 tablet, Rfl: 3   Propylhexedrine (BENZEDREX) INHA, Place into the nose daily., Disp: , Rfl:    sildenafil (VIAGRA) 100 MG tablet, , Disp: , Rfl:    sodium chloride  0.9 % nebulizer solution, Take 3 mLs by nebulization as needed for wheezing., Disp: 90 mL, Rfl: 3   tadalafil (CIALIS) 5 MG tablet, 5 mg daily as needed., Disp: , Rfl:     ALLERGIES   Latex and Adhesive [tape]   BP (!) 140/80 (BP Location: Right Arm, Patient Position: Sitting, Cuff Size: Normal)   Pulse 87   Temp 98 F (36.7 C) (Oral)   Ht 5' 6 (1.676 m)  Wt 170 lb 12.8 oz (77.5 kg)   SpO2 97%   BMI 27.57 kg/m    Review of Systems: Gen:  Denies  fever, sweats, chills weight loss  HEENT: Denies blurred vision, double vision, ear pain, eye pain, hearing loss, nose bleeds, sore throat Cardiac:  No dizziness, chest pain or heaviness, chest tightness,edema, No JVD Resp:   + cough, -sputum production, +shortness of breath,+wheezing, -hemoptysis,  Other:  All other systems negative   Physical Examination:   General Appearance: No distress  EYES PERRLA, EOM intact.   NECK Supple, No JVD Pulmonary: normal breath sounds, + wheezing.  CardiovascularNormal S1,S2.  No m/r/g.   Abdomen: Benign, Soft, non-tender. Neurology UE/LE 5/5 strength, no focal deficits Ext pulses intact, cap refill intact ALL OTHER ROS ARE NEGATIVE        ASSESSMENT/PLAN   78 year old pleasant white male seen today for assessment of shortness of breath increased work of breathing productive cough with wheezing and increased shortness of breath likely related to COPD exacerbation likely from inflammation and atypical infection with abnormal CT chest with right lung glass opacification  Acute COPD exacerbation In the office we gave nebulizer as well as Depo-Medrol  shot Prednisone  20 mg daily for 10 days Start azithromycin  Recommend starting Advair HFA 2 puffs in the morning 2  puffs at night Rinse mouth after use  Abnormal CT chest with right lung groundglass opacification Recommend follow-up CT chest in 2 months for interval assessment and changes    MEDICATION ADJUSTMENTS/LABS AND TESTS ORDERED: Prednisone  20 mg daily for 10 days Z-Pak Advair HFA Rinse mouth after use Follow-up CT scan in 2 months   CURRENT MEDICATIONS REVIEWED AT LENGTH WITH PATIENT TODAY   Patient  satisfied with Plan of action and management. All questions answered   Follow up 3 months with Dr Meade   I spent a total of 55 minutes dedicated to the care of this patient on the date of this encounter to include pre-visit review of records, face-to-face time with the patient discussing conditions above, post visit ordering of testing, clinical documentation with the electronic health record, making appropriate referrals as documented, and communicating necessary information to the patient's healthcare team.    The Patient requires high complexity decision making for assessment and support, frequent evaluation and titration of therapies, application of advanced monitoring technologies and extensive interpretation of multiple databases.  Patient satisfied with Plan of action and management. All questions answered    Nickolas Alm Cellar, M.D.  [facility]  Medical Director [facility] Medical Director [facility] Cardio-Pulmonary Department

## 2024-02-17 ENCOUNTER — Other Ambulatory Visit: Payer: Self-pay | Admitting: Family

## 2024-02-17 DIAGNOSIS — R9389 Abnormal findings on diagnostic imaging of other specified body structures: Secondary | ICD-10-CM

## 2024-02-17 DIAGNOSIS — J441 Chronic obstructive pulmonary disease with (acute) exacerbation: Secondary | ICD-10-CM

## 2024-02-18 NOTE — Telephone Encounter (Signed)
 [facility] closed out Daxen's referral due to Patient Refusal.

## 2024-02-27 ENCOUNTER — Encounter: Payer: Self-pay | Admitting: Internal Medicine

## 2024-02-27 ENCOUNTER — Ambulatory Visit: Admitting: Internal Medicine

## 2024-02-27 VITALS — BP 126/82 | HR 72 | Temp 97.9°F | Ht 66.0 in | Wt 159.0 lb

## 2024-02-27 DIAGNOSIS — Z9049 Acquired absence of other specified parts of digestive tract: Secondary | ICD-10-CM | POA: Diagnosis not present

## 2024-02-27 DIAGNOSIS — J441 Chronic obstructive pulmonary disease with (acute) exacerbation: Secondary | ICD-10-CM | POA: Diagnosis not present

## 2024-02-27 DIAGNOSIS — R911 Solitary pulmonary nodule: Secondary | ICD-10-CM

## 2024-02-27 DIAGNOSIS — J410 Simple chronic bronchitis: Secondary | ICD-10-CM

## 2024-02-27 DIAGNOSIS — J449 Chronic obstructive pulmonary disease, unspecified: Secondary | ICD-10-CM

## 2024-02-27 DIAGNOSIS — D803 Selective deficiency of immunoglobulin G [IgG] subclasses: Secondary | ICD-10-CM

## 2024-02-27 DIAGNOSIS — Z7722 Contact with and (suspected) exposure to environmental tobacco smoke (acute) (chronic): Secondary | ICD-10-CM | POA: Diagnosis not present

## 2024-02-27 DIAGNOSIS — Z23 Encounter for immunization: Secondary | ICD-10-CM

## 2024-02-27 DIAGNOSIS — K219 Gastro-esophageal reflux disease without esophagitis: Secondary | ICD-10-CM | POA: Diagnosis not present

## 2024-02-27 DIAGNOSIS — J45909 Unspecified asthma, uncomplicated: Secondary | ICD-10-CM | POA: Diagnosis not present

## 2024-02-27 DIAGNOSIS — Z9889 Other specified postprocedural states: Secondary | ICD-10-CM | POA: Diagnosis not present

## 2024-02-27 LAB — POCT EXHALED NITRIC OXIDE: FeNO level (ppb): 24

## 2024-02-27 NOTE — Patient Instructions (Signed)
 It was a pleasure to see you today!  Please schedule follow up with myself in 2 months.  If my schedule is not open yet, we will contact you with a reminder closer to that time. Please call [phone] if you haven't heard from us  a month before, and always call us  sooner if issues or concerns arise. You can also send us  a message through MyChart, but but aware that this is not to be used for urgent issues and it may take up to 5-7 days to receive a reply. Please be aware that you will likely be able to view your results before I have a chance to respond to them. Please give us  5 business days to respond to any non-urgent results.    VISIT SUMMARY:  Today, we discussed your chronic bronchitis, which has been worsening, and reviewed your recent CT scan findings. We also talked about your history of gastroesophageal reflux, exposure to secondhand smoke and hazardous chemicals, and low immunoglobulin levels. We have developed a plan to manage your symptoms and address these issues.  YOUR PLAN:  -CHRONIC BRONCHITIS WITH RECURRENT EXACERBATIONS AND ASTHMA SECONDARY TO CHRONIC BRONCHITIS: Chronic bronchitis is a long-term inflammation of the airways in the lungs, which can lead to frequent episodes of worsening symptoms. Your condition is likely due to reflux aspiration, secondhand smoke, and occupational exposures. We will repeat your CT scan in October when you are not acutely ill, administer the Pneumovax 20 vaccine today, and start you on the Advair inhaler if symptoms recur. We will also perform a breathing test today to check for allergic inflammation and may continue the inhaler if needed.  -GASTROESOPHAGEAL REFLUX WITH ASPIRATION RISK AFTER ESOPHAGECTOMY: Gastroesophageal reflux occurs when stomach acid flows back into the esophagus, which can lead to aspiration into the lungs and worsen bronchitis. This is particularly a concern given your history of esophagectomy.  -OCCUPATIONAL AND  ENVIRONMENTAL EXPOSURE TO TOXIC AGENTS INCLUDING SECONDHAND SMOKE: Your chronic bronchitis and asthma may be worsened by past exposure to secondhand smoke and hazardous chemicals. We discussed the potential health impacts of PFAS exposure and will provide a lab order for PFAS testing if available.  -IMMUNODEFICIENCY WITH LOW IGG: Low levels of immunoglobulin G (IgG) can make you more susceptible to infections. We will give you the Pneumovax 20 vaccine today and repeat blood work in four weeks to see how your body responds. If infections continue and IgG levels remain low, we may consider IgG replacement therapy.  INSTRUCTIONS:  Please schedule a follow-up CT scan for October when you are not acutely ill. We will also need to repeat blood work four weeks after today's vaccination to assess your response. If your symptoms return, start using the Advair inhaler as directed: two puffs in the morning and two puffs at night. Additionally, we will perform a breathing test today to check for allergic inflammation. If you have any questions or concerns, please contact our office.

## 2024-02-27 NOTE — Progress Notes (Signed)
 Jordan [name]    [id number]    08-31-1945  Primary Care Physician:Morrison, Bernardino MATSU, MD Date of Appointment: 02/27/2024 Established Patient Visit  Chief complaint:   Chief Complaint  Patient presents with   Follow-up    Pft, ct chest and Lab result.   Cough    Patient states he feels better, but still coughs of a little. Patient stopped using inhaler and nebulizer.     HPI: Jordan [name] is a 78 y.o. man, never smoker,  with history esophageal cancer s/p esophagectomy and gastric pull through with persistent reflux symptoms.   Interval Updates: Here for follow up.  Discussed the use of AI scribe software for clinical note transcription with the patient, who gave verbal consent to proceed.  History of Present Illness Jordan [name] is a 78 year old male with chronic bronchitis who presents with worsening respiratory symptoms and abnormal CT scan findings. He is accompanied by his wife, Jordan [name], and his son, Jordan [name]. He was referred by Dr. Jesus for evaluation of his chronic bronchitis and abnormal CT scan findings.  He has been experiencing chronic bronchitis for approximately two years, initially triggered by acid reflux. An esophagectomy was performed 35 years ago, and improper eating leads to reflux that aspirates into his lungs, causing bronchitis. Initially, his bronchitis responded well to steroids and antibiotics, but about a year ago, these treatments became less effective. A CT scan ordered by his primary care physician revealed issues in his lungs.  In the past six months, he has experienced six to seven episodes of bronchitis, characterized by severe coughing, chest congestion, shortness of breath, and sinus congestion. These episodes have increased in frequency since March of last year. During these episodes, he finds it difficult to breathe and is 'full of mucus,' with significant coughing and gagging. Between episodes, he feels normal and does not experience regular  shortness of breath.  Recently, he received a course of steroids and antibiotics, which resolved his symptoms. He was using two inhalers and a nebulizer until five days ago, but has since stopped as his symptoms have improved.  He has a history of exposure to secondhand smoke from both parents, who were heavy smokers, and occupational exposure to hazardous chemical fumes during his 31-year tenure at [employer]. He denies any history of asthma or bronchitis during childhood.  He has previously received a pneumonia vaccine, likely around the age of 62, and recent blood tests have indicated a low resistance to pneumonia and a low immunoglobulin level (IgG).    I have reviewed the patient's family social and past medical history and updated as appropriate.   Past Medical History:  Diagnosis Date   Allergy    Arthritis    back, shoulder- both    Barrett's esophagus    Barrett's esophagus with dysplasia 07/24/2021   BPH (benign prostatic hyperplasia)    Chronic lower back pain    Herniated disc at L4   Chronic pain 07/24/2021   Chronic rhinitis    Chronic sinusitis 11/11/2018   ED (erectile dysfunction) of organic origin    GERD (gastroesophageal reflux disease)    Barrett's Esophagus, treated /w esophagectomy - 1998, continues toi have reflux & uses Zantac on a regular basis    Hemoptysis 11/11/2018   History of wheezing 11/11/2018   Hypertension 10/20/21   Neuromuscular disorder (HCC) 2011   Tingling feet   Neuropathy 06/18/2016   Pneumonitis due to inhalation of food or vomitus (HCC) 07/30/2022  Thyroid  disease    Tinnitus    hearing loss in both ears    Urinary tract infectious disease 08/13/2022    Past Surgical History:  Procedure Laterality Date   CATARACT EXTRACTION Bilateral 2019   Dr. Lelon   ESOPHAGECTOMY     1998- at [facility], /w epidural & sedation & pain management /w continued  epidural    EYE SURGERY  2018   Cataracts   HERNIA REPAIR     LUMBAR  LAMINECTOMY/DECOMPRESSION MICRODISCECTOMY  03/22/2012   Procedure: LUMBAR LAMINECTOMY/DECOMPRESSION MICRODISCECTOMY 1 LEVEL;  Surgeon: Jordan [name] GORMAN Molt, MD;  Location: [facility];  Service: Neurosurgery;  Laterality: Right;  right lumbar four-five extra-foraminal microdiscectomy   SPINE SURGERY  2013   Ruptured disc L4    Family History  Problem Relation Age of Onset   Hypertension Mother    Alcohol abuse Mother    Arthritis Mother    Cancer Mother    Hypertension Father    Cancer Father    Heart attack Father    Alcohol abuse Father    Stroke Father    Allergic rhinitis Neg Hx    Asthma Neg Hx    Eczema Neg Hx    Urticaria Neg Hx     Social History   Occupational History   Occupation: Art gallery manager    Comment: Retired  Tobacco Use   Smoking status: Never   Smokeless tobacco: Never   Tobacco comments:    Never smoked  Vaping Use   Vaping status: Never Used  Substance and Sexual Activity   Alcohol use: Not Currently   Drug use: No   Sexual activity: Yes    Birth control/protection: Surgical, None     Physical Exam: Blood pressure 126/82, pulse 72, temperature 97.9 F (36.6 C), temperature source Oral, height 5' 6 (1.676 m), weight 159 lb (72.1 kg), SpO2 96%.  Gen:      No acute distress ENT:  no nasal polyps, mucus membranes moist Lungs:    No increased respiratory effort, symmetric chest wall excursion, clear to auscultation bilaterally, no wheezes or crackles CV:         Regular rate and rhythm; no murmurs, rubs, or gallops.  No pedal edema   Data Reviewed: Imaging: I have personally reviewed the ct chest July 2025 shows peribronchial thickening, ground glass in RUL, no frank fibrosis.   PFTs:     Latest Ref Rng & Units 01/22/2024   10:35 AM  PFT Results  FVC-Pre L 3.83   FVC-Predicted Pre % 107   FVC-Post L 3.86   FVC-Predicted Post % 107   Pre FEV1/FVC % % 66   Post FEV1/FCV % % 68   FEV1-Pre L 2.54   FEV1-Predicted Pre % 99   FEV1-Post L 2.63    DLCO uncorrected ml/min/mmHg 19.43   DLCO UNC% % 87   DLVA Predicted % 79   TLC L 7.21   TLC % Predicted % 113   RV % Predicted % 144    I have personally reviewed the patient's PFTs and mild airflow limitation with air trapping consistent with patient's suspected asthma  Labs: Lab Results  Component Value Date   WBC 6.8 01/24/2024   HGB 13.0 01/24/2024   HCT 39.4 01/24/2024   MCV 91 01/24/2024   PLT 211 01/24/2024   Lab Results  Component Value Date   NA 142 04/18/2023   K 4.4 04/18/2023   CO2 29 04/18/2023   GLUCOSE 119 (H) 04/18/2023  BUN 26 (H) 04/18/2023   CREATININE 1.05 04/18/2023   CALCIUM 9.1 04/18/2023   GFR 68.77 04/18/2023   GFRNONAA >60 02/15/2022    Immunization status: Immunization History  Administered Date(s) Administered   INFLUENZA, HIGH DOSE SEASONAL PF 04/12/2015   Influenza,inj,Quad PF,6-35 Mos 06/28/2011, 03/12/2014   PNEUMOCOCCAL CONJUGATE-20 02/27/2024   Pneumococcal Conjugate-13 05/24/2017   Pneumococcal Polysaccharide-23 12/29/2012   Tdap 05/24/2017   Zoster, Live 06/28/2011    External Records Personally Reviewed: pulmonary pcp  Assessment and Plan Assessment & Plan Chronic bronchitis with recurrent exacerbations and asthma secondary to chronic bronchitis Chronic bronchitis with increasing exacerbations likely due to reflux aspiration, secondhand smoke, and occupational exposures. CT shows airway inflammation and ground-glass opacities. Symptoms improve with steroids and antibiotics. Developing asthma secondary to chronic bronchitis, not COPD as he is a never smoker. - Order repeat CT scan in October, ensure he is not acutely ill to avoid false positives. - Administer Pneumovax 20 today. - If symptoms recur, initiate Advair inhaler, two puffs in the morning and two puffs at night. - Perform additional breathing test today to assess for allergic inflammation. Feno today was not elevated - If allergic inflammation is present, consider  maintaining Advair inhaler use even when asymptomatic. Feno today 24 ppb, not elevated or consitsent with TH2 inflammation  Gastroesophageal reflux with aspiration risk after esophagectomy Chronic bronchitis exacerbations likely related to aspiration from gastroesophageal reflux, especially given history of esophagectomy.  Occupational and environmental exposure to toxic agents including secondhand smoke Significant secondhand smoke and occupational exposure to hazardous chemicals potentially contributing to chronic bronchitis and asthma. Discussed PFAS exposure and potential health impacts. - Provide free text lab order for PFAS testing if [facility] can perform the test.  Immunodeficiency with low IgG Low IgG levels possibly contributing to increased infection risk. Plan to assess response to pneumococcal vaccination before considering IgG replacement therapy. - Administer Pneumovax 20 today. - Coordinate with Dr. Luke to repeat blood work four weeks post-vaccination to assess response. - Consider IgG replacement therapy if infections persist and IgG levels remain low.   I spent 40 minutes on 02/27/2024 in care of this patient including face to face time and non-face to face time spent charting, review of outside records, and coordination of care.   Return to Care: Return in about 2 months (around 04/28/2024).   Verdon Gore, MD Pulmonary and Critical Care Medicine [facility] Office:[phone]

## 2024-03-25 ENCOUNTER — Ambulatory Visit (INDEPENDENT_AMBULATORY_CARE_PROVIDER_SITE_OTHER): Admitting: Physician Assistant

## 2024-03-25 ENCOUNTER — Emergency Department (HOSPITAL_BASED_OUTPATIENT_CLINIC_OR_DEPARTMENT_OTHER)
Admission: EM | Admit: 2024-03-25 | Discharge: 2024-03-25 | Disposition: A | Discharge status: Home / Self Care | Source: Ambulatory Visit | Attending: Emergency Medicine | Admitting: Emergency Medicine

## 2024-03-25 ENCOUNTER — Encounter: Payer: Self-pay | Admitting: Physician Assistant

## 2024-03-25 ENCOUNTER — Emergency Department (HOSPITAL_BASED_OUTPATIENT_CLINIC_OR_DEPARTMENT_OTHER): Admitting: Radiology

## 2024-03-25 ENCOUNTER — Ambulatory Visit: Payer: Self-pay

## 2024-03-25 ENCOUNTER — Encounter (HOSPITAL_BASED_OUTPATIENT_CLINIC_OR_DEPARTMENT_OTHER): Payer: Self-pay | Admitting: Emergency Medicine

## 2024-03-25 ENCOUNTER — Other Ambulatory Visit: Payer: Self-pay

## 2024-03-25 VITALS — BP 136/66 | HR 47 | Temp 97.3°F | Ht 66.0 in | Wt 171.6 lb

## 2024-03-25 DIAGNOSIS — R030 Elevated blood-pressure reading, without diagnosis of hypertension: Secondary | ICD-10-CM | POA: Insufficient documentation

## 2024-03-25 DIAGNOSIS — J209 Acute bronchitis, unspecified: Secondary | ICD-10-CM | POA: Insufficient documentation

## 2024-03-25 DIAGNOSIS — J42 Unspecified chronic bronchitis: Secondary | ICD-10-CM | POA: Diagnosis not present

## 2024-03-25 DIAGNOSIS — R059 Cough, unspecified: Secondary | ICD-10-CM | POA: Diagnosis present

## 2024-03-25 DIAGNOSIS — R0602 Shortness of breath: Secondary | ICD-10-CM | POA: Insufficient documentation

## 2024-03-25 DIAGNOSIS — I10 Essential (primary) hypertension: Secondary | ICD-10-CM | POA: Diagnosis not present

## 2024-03-25 DIAGNOSIS — Z91040 Latex allergy status: Secondary | ICD-10-CM | POA: Diagnosis not present

## 2024-03-25 DIAGNOSIS — I499 Cardiac arrhythmia, unspecified: Secondary | ICD-10-CM

## 2024-03-25 DIAGNOSIS — K219 Gastro-esophageal reflux disease without esophagitis: Secondary | ICD-10-CM | POA: Diagnosis not present

## 2024-03-25 DIAGNOSIS — K2100 Gastro-esophageal reflux disease with esophagitis, without bleeding: Secondary | ICD-10-CM | POA: Insufficient documentation

## 2024-03-25 DIAGNOSIS — R0989 Other specified symptoms and signs involving the circulatory and respiratory systems: Secondary | ICD-10-CM

## 2024-03-25 LAB — CBC WITH DIFFERENTIAL/PLATELET
Abs Immature Granulocytes: 0.14 K/uL — ABNORMAL HIGH (ref 0.00–0.07)
Basophils Absolute: 0.1 K/uL (ref 0.0–0.1)
Basophils Relative: 1 %
Eosinophils Absolute: 0.7 K/uL — ABNORMAL HIGH (ref 0.0–0.5)
Eosinophils Relative: 9 %
HCT: 37.2 % — ABNORMAL LOW (ref 39.0–52.0)
Hemoglobin: 12.1 g/dL — ABNORMAL LOW (ref 13.0–17.0)
Immature Granulocytes: 2 %
Lymphocytes Relative: 23 %
Lymphs Abs: 1.8 K/uL (ref 0.7–4.0)
MCH: 28.9 pg (ref 26.0–34.0)
MCHC: 32.5 g/dL (ref 30.0–36.0)
MCV: 88.8 fL (ref 80.0–100.0)
Monocytes Absolute: 0.6 K/uL (ref 0.1–1.0)
Monocytes Relative: 8 %
Neutro Abs: 4.5 K/uL (ref 1.7–7.7)
Neutrophils Relative %: 57 %
Platelets: 205 K/uL (ref 150–400)
RBC: 4.19 MIL/uL — ABNORMAL LOW (ref 4.22–5.81)
RDW: 14.5 % (ref 11.5–15.5)
WBC: 7.9 K/uL (ref 4.0–10.5)
nRBC: 0 % (ref 0.0–0.2)

## 2024-03-25 LAB — RESP PANEL BY RT-PCR (RSV, FLU A&B, COVID)  RVPGX2
Influenza A by PCR: NEGATIVE
Influenza B by PCR: NEGATIVE
Resp Syncytial Virus by PCR: NEGATIVE
SARS Coronavirus 2 by RT PCR: NEGATIVE

## 2024-03-25 LAB — BASIC METABOLIC PANEL WITH GFR
Anion gap: 13 (ref 5–15)
BUN: 23 mg/dL (ref 8–23)
CO2: 23 mmol/L (ref 22–32)
Calcium: 9.3 mg/dL (ref 8.9–10.3)
Chloride: 106 mmol/L (ref 98–111)
Creatinine, Ser: 1.07 mg/dL (ref 0.61–1.24)
GFR, Estimated: >60 mL/min (ref >60)
Glucose, Bld: 103 mg/dL — ABNORMAL HIGH (ref 70–99)
Potassium: 4.4 mmol/L (ref 3.5–5.1)
Sodium: 143 mmol/L (ref 135–145)

## 2024-03-25 LAB — TROPONIN T, HIGH SENSITIVITY: Troponin T High Sensitivity: <15 ng/L (ref 0–19)

## 2024-03-25 LAB — PRO BRAIN NATRIURETIC PEPTIDE: Pro Brain Natriuretic Peptide: 206 pg/mL (ref <300.0)

## 2024-03-25 LAB — MAGNESIUM: Magnesium: 2 mg/dL (ref 1.7–2.4)

## 2024-03-25 MED ORDER — IPRATROPIUM-ALBUTEROL 0.5-2.5 (3) MG/3ML IN SOLN
3.0000 mL | Freq: Once | RESPIRATORY_TRACT | Status: AC
  Administered 2024-03-25: 3 mL via RESPIRATORY_TRACT
  Filled 2024-03-25: qty 3

## 2024-03-25 MED ORDER — METHYLPREDNISOLONE SODIUM SUCC 125 MG IJ SOLR
125.0000 mg | Freq: Once | INTRAMUSCULAR | Status: AC
  Administered 2024-03-25: 125 mg via INTRAVENOUS
  Filled 2024-03-25: qty 2

## 2024-03-25 MED ORDER — AZITHROMYCIN 250 MG PO TABS: 250.0000 mg | ORAL_TABLET | Freq: Every day | ORAL | 0 refills | Status: DC

## 2024-03-25 MED ORDER — PREDNISONE 20 MG PO TABS: 60.0000 mg | ORAL_TABLET | Freq: Every day | ORAL | 0 refills | Status: DC

## 2024-03-25 NOTE — ED Triage Notes (Signed)
 States was sent here from PCP d/t bradycardia and chronic cough that has worsen over the last 2 days.   Denies symptoms other than cough

## 2024-03-25 NOTE — Telephone Encounter (Signed)
 Scheduled

## 2024-03-25 NOTE — Telephone Encounter (Signed)
 Appointment made for today 03/25/2024 at 9:30am at patient's PCP office with Alyssa Allwardt PA-C   FYI Only or Action Required?: FYI only for provider.  Patient was last seen in primary care on 12/23/2023 by Jesus Bernardino MATSU, MD.  Called Nurse Triage reporting Cough.  Symptoms began several days ago.  Interventions attempted: OTC medications: cough drops, nasal spray, cold medication, Prescription medications: inhalers, nebulizers, and Rest, hydration, or home remedies.  Symptoms are: gradually worsening.  Triage Disposition: See Physician Within 24 Hours  Patient/caregiver understands and will follow disposition?: Yes        Copied from CRM #[id number]. Topic: Clinical - Red Word Triage >> Mar 25, 2024  8:03 AM Suzen RAMAN wrote: Red Word that prompted transfer to Nurse Triage: productive cough with increased sputum and headache. Requesting an appt today Reason for Disposition  [1] Continuous (nonstop) coughing interferes with work or school AND [2] no improvement using cough treatment per Care Advice  Answer Assessment - Initial Assessment Questions Taking chest congestion over the counter medication & a nasal spray & cough drops Patient is advised to call us  back if anything changes or with any further questions/concerns. Patient is advised that if anything worsens to go to the Emergency Room. Patient verbalized understanding.   1. ONSET: When did the cough begin?      About 4 days ago 2. SEVERITY: How bad is the cough today?      continuous 3. SPUTUM: Describe the color of your sputum (e.g., none, dry cough; clear, white, yellow, green)     Clear/slightly yellow 4. HEMOPTYSIS: Are you coughing up any blood? If Yes, ask: How much? (e.g., flecks, streaks, tablespoons, etc.)     no 5. DIFFICULTY BREATHING: Are you having difficulty breathing? If Yes, ask: How bad is it? (e.g., mild, moderate, severe)      Using inhalers and nebulizer---no difficulty breathing  during triage 6. FEVER: Do you have a fever? If Yes, ask: What is your temperature, how was it measured, and when did it start?     no 7. CARDIAC HISTORY: Do you have any history of heart disease? (e.g., heart attack, congestive heart failure)      Patient denies 8. LUNG HISTORY: Do you have any history of lung disease?  (e.g., pulmonary embolus, asthma, emphysema)     Chronic bronchitis 9. PE RISK FACTORS: Do you have a history of blood clots? (or: recent major surgery, recent prolonged travel, bedridden)     ----- 10. OTHER SYMPTOMS: Do you have any other symptoms? (e.g., runny nose, wheezing, chest pain)       Congestion, cough  Protocols used: Cough - Acute Productive-A-AH

## 2024-03-25 NOTE — Discharge Instructions (Signed)
 You were seen in the urgency department for increased shortness of breath.  Your chest x-ray and COVID swabs were negative and your lab work was fairly unremarkable.  We are starting you on antibiotics and steroids.  Please monitor your blood pressure.  Follow-up with your primary care doctor or lung specialist.  Return if any worsening or concerning symptoms.

## 2024-03-25 NOTE — Patient Instructions (Addendum)
 Jordan [name],  It was nice to see you today. Unfortunately, your lungs and heart need a further evaluation than what we can do in the primary care outpatient setting.  Please proceed directly to the following location:  [facility] Emergency Department at [facility] Address: [address], [place], [place] [address] Phone: [phone]

## 2024-03-25 NOTE — Progress Notes (Signed)
 Patient ID: Jordan [name], male    DOB: July 15, 1945, 78 y.o.   MRN: [id number]   Assessment & Plan:  Chronic bronchitis, unspecified chronic bronchitis type (HCC)  Decreased breath sounds of both lungs  Cardiac arrhythmia, unspecified cardiac arrhythmia type  Gastroesophageal reflux disease, unspecified whether esophagitis present      Assessment and Plan Assessment & Plan Chronic bronchitis with acute productive cough Chronic bronchitis exacerbated by recent aspiration due to gastroesophageal reflux. Productive cough for four days, worsening despite use of inhalers and nebulizer. Very diminished breath sounds throughout. No chest pain reported. Recent history of antibiotic and steroid treatment five to six weeks ago with good response. - Refer to emergency room for pulmonary intervention  Gastroesophageal reflux disease with aspiration risk Gastroesophageal reflux disease with recent aspiration event leading to bronchitis exacerbation. Managed with Protonix  and dietary modifications. Reports improvement with dietary changes and head elevation during sleep. Undergoes annual endoscopy, but no corrective intervention available for esophageal-stomach junction issue. - Continue Protonix  and dietary modifications - Avoid eating after 6 PM and elevate head during sleep  Abnormal heart rhythm (arrhythmia) New onset abnormal heart rhythm detected during examination. No history of atrial fibrillation or skipped beats. No chest pain or palpitations reported. Heart rate irregular, prompting concern for potential cardiac issue. - Refer to emergency room for cardiac evaluation     No follow-ups on file.    Subjective:    Chief Complaint  Patient presents with   Cough    Pt in office c/o productive cough; pt states per triage note able to cough up sputum at this time.  Pt admits he has chronic bronchitis; pt is taking OTC meds for chest congestion and using Halls throat drops;     Cough   Discussed the use of AI scribe software for clinical note transcription with the patient, who gave verbal consent to proceed.  History of Present Illness Jordan [name] is a 78 year old male with chronic bronchitis who presents with a productive cough.  He has experienced a productive cough for about four days, consistent with his history of chronic bronchitis. The cough is persistent with phlegm production. His nebulizer and inhalers provide temporary relief, but symptoms gradually return. He recently visited his pulmonologist and received a pneumonia shot. Approximately five to six weeks ago, he was treated with a steroid shot and antibiotics, which resolved his symptoms for about five weeks before the current episode began. He is currently using two inhalers and a nebulizer, and he also uses a vaporizer at night.  He has a history of acid reflux, for which he takes Protonix  and Pepcid Complete. He manages his condition by watching his diet, avoiding eating after 6 PM, and sleeping with his head elevated. Certain foods, like fried chicken, exacerbate his symptoms. He undergoes an annual endoscopy. He reports that something has caused a problem in his esophagus where it meets his stomach, and that he began experiencing aspiration into his lungs about a year or two ago.  No chest pain, fever, or history of atrial fibrillation. He mentions experiencing a constant tremor and occasional ankle swelling. His weight is around 172 pounds, which he states is normal for him.     Past Medical History:  Diagnosis Date   Allergy    Arthritis    back, shoulder- both    Barrett's esophagus    Barrett's esophagus with dysplasia 07/24/2021   BPH (benign prostatic hyperplasia)    Chronic lower back pain  Herniated disc at L4   Chronic pain 07/24/2021   Chronic rhinitis    Chronic sinusitis 11/11/2018   ED (erectile dysfunction) of organic origin    GERD (gastroesophageal reflux disease)     Barrett's Esophagus, treated /w esophagectomy - 1998, continues toi have reflux & uses Zantac on a regular basis    Hemoptysis 11/11/2018   History of wheezing 11/11/2018   Hypertension 10/20/21   Neuromuscular disorder (HCC) 2011   Tingling feet   Neuropathy 06/18/2016   Pneumonitis due to inhalation of food or vomitus (HCC) 07/30/2022   Thyroid  disease    Tinnitus    hearing loss in both ears    Urinary tract infectious disease 08/13/2022    Past Surgical History:  Procedure Laterality Date   CATARACT EXTRACTION Bilateral 2019   Dr. Lelon   ESOPHAGECTOMY     1998- at [facility], /w epidural & sedation & pain management /w continued  epidural    EYE SURGERY  2018   Cataracts   HERNIA REPAIR     LUMBAR LAMINECTOMY/DECOMPRESSION MICRODISCECTOMY  03/22/2012   Procedure: LUMBAR LAMINECTOMY/DECOMPRESSION MICRODISCECTOMY 1 LEVEL;  Surgeon: Alm GORMAN Molt, MD;  Location: [facility];  Service: Neurosurgery;  Laterality: Right;  right lumbar four-five extra-foraminal microdiscectomy   SPINE SURGERY  2013   Ruptured disc L4    Family History  Problem Relation Age of Onset   Hypertension Mother    Alcohol abuse Mother    Arthritis Mother    Cancer Mother    Hypertension Father    Cancer Father    Heart attack Father    Alcohol abuse Father    Stroke Father    Allergic rhinitis Neg Hx    Asthma Neg Hx    Eczema Neg Hx    Urticaria Neg Hx     Social History   Tobacco Use   Smoking status: Never   Smokeless tobacco: Never   Tobacco comments:    Never smoked  Vaping Use   Vaping status: Never Used  Substance Use Topics   Alcohol use: Not Currently   Drug use: No     Allergies  Allergen Reactions   Latex    Adhesive [Tape] Rash    Review of Systems  Respiratory:  Positive for cough.    NEGATIVE UNLESS OTHERWISE INDICATED IN HPI      Objective:     BP 136/66 (BP Location: Left Arm, Patient Position: Sitting, Cuff Size: Normal)   Pulse (!) 47 Comment: Pt  states unusal for pulse to be that low  Temp (!) 97.3 F (36.3 C) (Temporal)   Ht 5' 6 (1.676 m)   Wt 171 lb 9.6 oz (77.8 kg)   SpO2 97%   BMI 27.70 kg/m   Wt Readings from Last 3 Encounters:  03/25/24 171 lb 9.6 oz (77.8 kg)  02/27/24 159 lb (72.1 kg)  02/05/24 170 lb 12.8 oz (77.5 kg)    BP Readings from Last 3 Encounters:  03/25/24 136/66  02/27/24 126/82  02/05/24 (!) 140/80     Physical Exam Vitals and nursing note reviewed.  Constitutional:      Appearance: Normal appearance.  Eyes:     Extraocular Movements: Extraocular movements intact.     Conjunctiva/sclera: Conjunctivae normal.     Pupils: Pupils are equal, round, and reactive to light.  Cardiovascular:     Rate and Rhythm: Normal rate. Rhythm irregular.  Pulmonary:     Effort: No respiratory distress.  Breath sounds: Wheezing and rhonchi present.  Skin:    General: Skin is warm.     Findings: No rash.  Neurological:     General: No focal deficit present.     Mental Status: He is alert and oriented to person, place, and time.  Psychiatric:        Mood and Affect: Mood normal.             Jordan [name] M Baldo Hufnagle, PA-C

## 2024-03-25 NOTE — ED Provider Notes (Signed)
 [place] EMERGENCY DEPARTMENT AT [facility] Provider Note   CSN: [id number] Arrival date & time: 03/25/24  1006     Patient presents with: Cough   Jordan [name] is a 78 y.o. male.  He has a history of chronic bronchitis that he attributes to acid reflux from a prior esophagectomy.  Has had 1 week of worsening cough and shortness of breath in the setting of an exacerbation of reflux.  Saw his PCP today who recommended he come here for further evaluation.  They also noted his pulse was irregular and slow.  He denies any chest pain.  He has been using breathing treatments.  He usually needs to go on steroids and antibiotics when he has a flare like this.  Follows with pulmonology Dr. Meade.  He has also noticed some left lower back pain and wonders if he might have a kidney infection.  Urine has been dark.  No fevers chills nausea vomiting.  No dysuria.   The history is provided by the patient.  Cough Cough characteristics:  Non-productive Sputum characteristics:  Nondescript Severity:  Moderate Onset quality:  Gradual Duration:  1 week Timing:  Intermittent Progression:  Worsening Chronicity:  Recurrent Smoker: no   Relieved by:  Nothing Worsened by:  Activity Ineffective treatments:  Beta-agonist inhaler Associated symptoms: shortness of breath   Associated symptoms: no chest pain, no chills and no fever        Prior to Admission medications   Medication Sig Start Date End Date Taking? Authorizing Provider  albuterol  (VENTOLIN  HFA) 108 (90 Base) MCG/ACT inhaler Inhale 2 puffs into the lungs every 6 (six) hours as needed for wheezing or shortness of breath. 01/18/23   Jesus Bernardino MATSU, MD  chlorthalidone  (HYGROTON ) 25 MG tablet Take 25 mg by mouth daily. PRN only per Patient 01/28/23   [provider]  Famotidine-Ca Carb-Mag Hydrox (PEPCID COMPLETE PO) Take by mouth daily.    [provider]  fluticasone -salmeterol (ADVAIR HFA) 230-21 MCG/ACT inhaler  Inhale 2 puffs into the lungs 2 (two) times daily. 02/05/24   Kasa, Kurian, MD  ibuprofen (ADVIL) 200 MG tablet Take 200 mg by mouth 4 (four) times daily.    [provider]  ipratropium (ATROVENT ) 0.06 % nasal spray Place 1-2 sprays into both nostrils 3 (three) times daily as needed (runny nose/drainage). 01/21/24   Luke Orlan HERO, DO  ipratropium-albuterol  (DUONEB) 0.5-2.5 (3) MG/3ML SOLN Take 3 mLs by nebulization every 6 (six) hours as needed. 06/24/23   Job Lukes, PA  mometasone  (NASONEX ) 50 MCG/ACT nasal spray Place 1-2 sprays into the nose daily. For nasal congestion 01/21/24   Luke Orlan HERO, DO  pantoprazole  (PROTONIX ) 40 MG tablet Take 1 tablet (40 mg total) by mouth daily. 09/17/23   Jesus Bernardino MATSU, MD  sildenafil (VIAGRA) 100 MG tablet     [provider]  sodium chloride  0.9 % nebulizer solution Take 3 mLs by nebulization as needed for wheezing. 06/27/23   Jesus Bernardino MATSU, MD    Allergies: Latex and Adhesive [tape]    Review of Systems  Constitutional:  Negative for chills and fever.  Respiratory:  Positive for cough and shortness of breath.   Cardiovascular:  Negative for chest pain.    Updated Vital Signs BP (!) 212/79 (BP Location: Right Arm)   Pulse 86   Temp 97.9 F (36.6 C) (Oral)   Resp 17   SpO2 97%   Physical Exam Vitals and nursing note reviewed.  Constitutional:  Appearance: Normal appearance. He is well-developed.  HENT:     Head: Normocephalic and atraumatic.  Eyes:     Conjunctiva/sclera: Conjunctivae normal.  Cardiovascular:     Rate and Rhythm: Normal rate. Rhythm irregular.     Heart sounds: No murmur heard. Pulmonary:     Effort: Pulmonary effort is normal. No respiratory distress.     Breath sounds: Rhonchi (few scattered) present.  Abdominal:     Palpations: Abdomen is soft.     Tenderness: There is no abdominal tenderness. There is no guarding or rebound.  Musculoskeletal:     Cervical back: Neck supple.     Right lower  leg: No edema.     Left lower leg: No edema.  Skin:    General: Skin is warm and dry.  Neurological:     General: No focal deficit present.     Mental Status: He is alert.     GCS: GCS eye subscore is 4. GCS verbal subscore is 5. GCS motor subscore is 6.     Gait: Gait normal.     (all labs ordered are listed, but only abnormal results are displayed) Labs Reviewed  BASIC METABOLIC PANEL WITH GFR - Abnormal; Notable for the following components:      Result Value   Glucose, Bld 103 (*)    All other components within normal limits  CBC WITH DIFFERENTIAL/PLATELET - Abnormal; Notable for the following components:   RBC 4.19 (*)    Hemoglobin 12.1 (*)    HCT 37.2 (*)    Eosinophils Absolute 0.7 (*)    Abs Immature Granulocytes 0.14 (*)    All other components within normal limits  RESP PANEL BY RT-PCR (RSV, FLU A&B, COVID)  RVPGX2  MAGNESIUM  PRO BRAIN NATRIURETIC PEPTIDE  URINALYSIS, ROUTINE W REFLEX MICROSCOPIC  TROPONIN T, HIGH SENSITIVITY    EKG: EKG Interpretation Date/Time:  Wednesday March 25 2024 10:17:12 EDT Ventricular Rate:  66 PR Interval:  144 QRS Duration:  130 QT Interval:  406 QTC Calculation: 425 R Axis:   2  Text Interpretation: Sinus rhythm with Premature atrial complexes Right bundle branch block Abnormal ECG When compared with ECG of 21-Mar-2012 12:52, Right bundle branch block has replaced Incomplete right bundle branch block Confirmed by Towana Sharper [phone]) on 03/25/2024 10:19:12 AM  Radiology: [facility] Chest 2 View Result Date: 03/25/2024 CLINICAL DATA:  Cough. EXAM: CHEST - 2 VIEW COMPARISON:  12/23/2023 FINDINGS: Stable scarring at the lung apices. Otherwise, the lungs are clear. Multiple surgical clips in the left upper abdomen and along the midline of the chest. Heart size is stable. Trachea is midline. No pleural effusions. No acute bone abnormality. IMPRESSION: No active cardiopulmonary disease. Electronically Signed   By: Juliene Balder M.D.   On:  03/25/2024 11:15     Procedures   Medications Ordered in the ED  ipratropium-albuterol  (DUONEB) 0.5-2.5 (3) MG/3ML nebulizer solution 3 mL (3 mLs Nebulization Given 03/25/24 1155)  methylPREDNISolone  sodium succinate (SOLU-MEDROL ) 125 mg/2 mL injection 125 mg (125 mg Intravenous Given 03/25/24 1219)    Clinical Course as of 03/25/24 1725  Wed Mar 25, 2024  1111 Chest x-ray interpreted by me as no acute infiltrate.  Awaiting radiology reading. [MB]  1241 Patient states he is feeling better.  He does not want to wait and give a urine sample because he just went to the bathroom.  Said he does not have any urinary symptoms right now.  He has noticed his blood pressure being high  said he has been on blood pressure medicine before but does not take it currently.  He will monitor that at home and start blood pressure medicine again if remains high.  Will cover with antibiotics and steroids.  Return instructions discussed [MB]    Clinical Course User Index [MB] Towana Ozell BROCKS, MD                                 Medical Decision Making Amount and/or Complexity of Data Reviewed Labs: ordered. Radiology: ordered.  Risk Prescription drug management.   This patient complains of cough shortness of breath; this involves an extensive number of treatment Options and is a complaint that carries with it a high risk of complications and morbidity. The differential includes bronchitis, pneumonia, aspiration, CHF, with COPD, ACS  I ordered, reviewed and interpreted labs, which included CBC unremarkable chemistries unremarkable COVID and flu negative troponin flat BNP within normal I ordered medication steroids breathing treatment and reviewed PMP when indicated. I ordered imaging studies which included chest x-ray and I independently    visualized and interpreted imaging which showed no acute findings Additional history obtained from patient's wife Previous records obtained and reviewed in epic  including PCP and pulmonary notes Cardiac monitoring reviewed, sinus arrhythmia Social determinants considered, no significant barriers Critical Interventions: None  After the interventions stated above, I reevaluated the patient and found patient to be symptomatically feeling better and no evidence of hypoxia Admission and further testing considered, no indications for admission at this time.  Will start on steroids and antibiotics and recommended close follow-up with his treatment team.  Return instructions discussed      Final diagnoses:  Acute bronchitis, unspecified organism  Elevated blood pressure reading    ED Discharge Orders          Ordered    azithromycin  (ZITHROMAX ) 250 MG tablet  Daily        03/25/24 1244    predniSONE  (DELTASONE ) 20 MG tablet  Daily        03/25/24 1244               Towana Ozell BROCKS, MD 03/25/24 1727

## 2024-03-31 ENCOUNTER — Encounter: Payer: Self-pay | Admitting: Internal Medicine

## 2024-03-31 ENCOUNTER — Ambulatory Visit: Admitting: Internal Medicine

## 2024-03-31 VITALS — BP 138/82 | HR 78 | Temp 98.2°F | Ht 66.0 in | Wt 171.6 lb

## 2024-03-31 DIAGNOSIS — J690 Pneumonitis due to inhalation of food and vomit: Secondary | ICD-10-CM | POA: Diagnosis not present

## 2024-03-31 DIAGNOSIS — M545 Low back pain, unspecified: Secondary | ICD-10-CM

## 2024-03-31 DIAGNOSIS — R918 Other nonspecific abnormal finding of lung field: Secondary | ICD-10-CM | POA: Diagnosis not present

## 2024-03-31 MED ORDER — HYDROCODONE-ACETAMINOPHEN 5-325 MG PO TABS: 1.0000 | ORAL_TABLET | Freq: Four times a day (QID) | ORAL | 0 refills | Status: AC | PRN

## 2024-03-31 NOTE — Assessment & Plan Note (Signed)
 Chronic lower back pain status post L4-L5 surgery   He experiences intermittent lower back pain exacerbated by physical activity, managed with sparing use of hydrocodone . Constant pain persists since the L4-L5 surgery, with no current involvement of a pain management specialist. Prescribe hydrocodone  5/325 mg, 45 tablets, for discretionary use. Discuss the risks of hydrocodone , including addiction, risk of death with alcohol, and the need for secure storage. PDMP reviewed during this encounter.

## 2024-03-31 NOTE — Progress Notes (Signed)
 ==============================  [facility]: [phone]   -- Medical Office Visit --  Patient: Jordan [name]      Age: 78 y.o.       Sex:  male  Date:   03/31/2024 Today's Healthcare Provider: Bernardino KANDICE Cone, MD  ==============================   Chief Complaint: Medication Refill   Discussed the use of AI scribe software for clinical note transcription with the patient, who gave verbal consent to proceed.  History of Present Illness  78 year old male with chronic back pain and recurrent bronchitis who presents for a hydrocodone  refill and follow-up on bronchitis.  He reports intermittent lower back pain, particularly after activities such as fishing and yard work. He has a history of back surgery and reports constant back pain. He reports using hydrocodone  sparingly, approximately once or twice a month, and has been on this regimen for three years. He also takes ibuprofen daily for pain management.  He has a history of recurrent bronchitis, which he associates with esophageal reflux. His last episode of reflux occurred on October 5th, leading to a visit to the emergency room on October 8th due to chronic bronchitis symptoms, including coughing up fluid. An EKG, blood tests, and chest x-ray were performed, all returning negative results. He received a nebulizer treatment with albuterol , a steroid shot, and was prescribed steroids and antibiotics, which he completed by October 5th. He feels better but continues to cough up a small amount of phlegm occasionally. He has a nebulizer at home and uses albuterol  as needed.  He has made lifestyle changes to manage his reflux, including dietary modifications. He avoids fried and spicy foods, chocolate, sweets, and carbonated drinks, and does not eat after 6 PM. He drinks herbal tea and takes pantoprazole  in the morning and Pepcid at night. He received a pneumonia vaccine recently and plans to have a blood test and  CT scan next month.  Pulse Readings from Last 3 Encounters:  03/31/24 78  03/25/24 83  03/25/24 (!) 47   Background Reviewed: Problem List: has GERD (gastroesophageal reflux disease); Hypertension; Allergies; Back pain at L4-L5 level; Displacement of lumbar intervertebral disc without myelopathy; Dysphagia; Dizziness; Hypertensive retinopathy; Lower urinary tract symptoms due to benign prostatic hyperplasia; Mixed hyperlipidemia; Pneumonitis due to inhalation of regurgitated food (HCC); Encounter for screening for malignant neoplasm of colon; Stricture of esophagus; Chronic bronchitis (HCC); H/O esophagectomy; Mechanical complication of esophagostomy (HCC); Insomnia; PSA elevation; BPH (benign prostatic hyperplasia); Restless legs syndrome; Hyperlipidemia, acquired; Peripheral polyneuropathy; Hiatal hernia; Arthritis of carpometacarpal (CMC) joint of left thumb; Chronic anemia; Suspected exposure hazardous substances; 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 Past Medical History:  has a past medical history of Allergy, Arthritis, Barrett's esophagus, Barrett's esophagus with dysplasia (07/24/2021), BPH (benign prostatic hyperplasia), Chronic lower back pain, Chronic pain (07/24/2021), Chronic rhinitis, Chronic sinusitis (11/11/2018), ED (erectile dysfunction) of organic origin, GERD (gastroesophageal reflux disease), Hemoptysis (11/11/2018), History of wheezing (11/11/2018), Hypertension (10/20/21), Neuromuscular disorder (HCC) (2011), Neuropathy (06/18/2016), Pneumonitis due to inhalation of food or vomitus (HCC) (07/30/2022), Thyroid  disease, Tinnitus, and Urinary tract infectious disease (08/13/2022). Past Surgical History:   has a past surgical history that includes Esophagectomy; Lumbar laminectomy/decompression microdiscectomy (03/22/2012); Hernia repair; Cataract extraction (Bilateral, 2019); Eye surgery (2018); and Spine  surgery (2013). Social History:   reports that he has never smoked. He has never used smokeless tobacco. He reports that he does not currently use alcohol. He reports that he  does not use drugs. Family History:  family history includes Alcohol abuse in his father and mother; Arthritis in his mother; Cancer in his father and mother; Heart attack in his father; Hypertension in his father and mother; Stroke in his father. Allergies:  is allergic to latex and adhesive [tape].   Medication Reconciliation: Current Outpatient Medications on File Prior to Visit  Medication Sig   albuterol  (VENTOLIN  HFA) 108 (90 Base) MCG/ACT inhaler Inhale 2 puffs into the lungs every 6 (six) hours as needed for wheezing or shortness of breath.   Famotidine-Ca Carb-Mag Hydrox (PEPCID COMPLETE PO) Take by mouth daily.   fluticasone -salmeterol (ADVAIR HFA) 230-21 MCG/ACT inhaler Inhale 2 puffs into the lungs 2 (two) times daily.   ibuprofen (ADVIL) 200 MG tablet Take 200 mg by mouth 4 (four) times daily.   ipratropium (ATROVENT ) 0.06 % nasal spray Place 1-2 sprays into both nostrils 3 (three) times daily as needed (runny nose/drainage).   ipratropium-albuterol  (DUONEB) 0.5-2.5 (3) MG/3ML SOLN Take 3 mLs by nebulization every 6 (six) hours as needed.   mometasone  (NASONEX ) 50 MCG/ACT nasal spray Place 1-2 sprays into the nose daily. For nasal congestion   pantoprazole  (PROTONIX ) 40 MG tablet Take 1 tablet (40 mg total) by mouth daily.   sildenafil (VIAGRA) 100 MG tablet    sodium chloride  0.9 % nebulizer solution Take 3 mLs by nebulization as needed for wheezing.   No current facility-administered medications on file prior to visit.   Medications Discontinued During This Encounter  Medication Reason   chlorthalidone  (HYGROTON ) 25 MG tablet    azithromycin  (ZITHROMAX ) 250 MG tablet    predniSONE  (DELTASONE ) 20 MG tablet      Physical Exam:    03/31/2024    8:54 AM 03/25/2024   11:04 AM 03/25/2024   10:18 AM   Vitals with BMI  Height 5' 6    Weight 171 lbs 10 oz    BMI 27.71    Systolic 138 149 787  Diastolic 82 121 79  Pulse 78 83 86  Vital signs reviewed.  Nursing notes reviewed. Weight trend reviewed. Physical Activity: Sufficiently Active (10/15/2023)   Exercise Vital Sign    Days of Exercise per Week: 3 days    Minutes of Exercise per Session: 150+ min   General Appearance:  No acute distress appreciable.   Well-groomed, healthy-appearing male.  Well proportioned with no abnormal fat distribution.  Good muscle tone. Pulmonary:  Normal work of breathing at rest, no respiratory distress apparent. SpO2: 98 %  Musculoskeletal: All extremities are intact.  Neurological:  Awake, alert, oriented, and engaged.  No obvious focal neurological deficits or cognitive impairments.  Sensorium seems unclouded.   Speech is clear and coherent with logical content. Psychiatric:  Appropriate mood, pleasant and cooperative demeanor, thoughtful and engaged during the exam   Verbalized to patient: Physical Exam CHEST: Clear to auscultation bilaterally. No wheezes, rhonchi, or crackles.   Results: Verbalized to patient: Results RADIOLOGY Chest X-ray: Negative (03/25/2024)  DIAGNOSTIC ECG: Normal (03/25/2024)   Admission on 03/25/2024, Discharged on 03/25/2024  Component Date Value Ref Range Status   Sodium 03/25/2024 143  135 - 145 mmol/L Final   Potassium 03/25/2024 4.4  3.5 - 5.1 mmol/L Final   Chloride 03/25/2024 106  98 - 111 mmol/L Final   CO2 03/25/2024 23  22 - 32 mmol/L Final   Glucose, Bld 03/25/2024 103 (H)  70 - 99 mg/dL Final   BUN 89/91/7974 23  8 - 23 mg/dL Final  Creatinine, Ser 03/25/2024 1.07  0.61 - 1.24 mg/dL Final   Calcium 89/91/7974 9.3  8.9 - 10.3 mg/dL Final   GFR, Estimated 03/25/2024 >60  >60 mL/min Final   Anion gap 03/25/2024 13  5 - 15 Final   WBC 03/25/2024 7.9  4.0 - 10.5 K/uL Final   RBC 03/25/2024 4.19 (L)  4.22 - 5.81 MIL/uL Final   Hemoglobin 03/25/2024  12.1 (L)  13.0 - 17.0 g/dL Final   HCT 89/91/7974 37.2 (L)  39.0 - 52.0 % Final   MCV 03/25/2024 88.8  80.0 - 100.0 fL Final   MCH 03/25/2024 28.9  26.0 - 34.0 pg Final   MCHC 03/25/2024 32.5  30.0 - 36.0 g/dL Final   RDW 89/91/7974 14.5  11.5 - 15.5 % Final   Platelets 03/25/2024 205  150 - 400 K/uL Final   nRBC 03/25/2024 0.0  0.0 - 0.2 % Final   Neutrophils Relative % 03/25/2024 57  % Final   Neutro Abs 03/25/2024 4.5  1.7 - 7.7 K/uL Final   Lymphocytes Relative 03/25/2024 23  % Final   Lymphs Abs 03/25/2024 1.8  0.7 - 4.0 K/uL Final   Monocytes Relative 03/25/2024 8  % Final   Monocytes Absolute 03/25/2024 0.6  0.1 - 1.0 K/uL Final   Eosinophils Relative 03/25/2024 9  % Final   Eosinophils Absolute 03/25/2024 0.7 (H)  0.0 - 0.5 K/uL Final   Basophils Relative 03/25/2024 1  % Final   Basophils Absolute 03/25/2024 0.1  0.0 - 0.1 K/uL Final   Immature Granulocytes 03/25/2024 2  % Final   Abs Immature Granulocytes 03/25/2024 0.14 (H)  0.00 - 0.07 K/uL Final   Magnesium 03/25/2024 2.0  1.7 - 2.4 mg/dL Final   SARS Coronavirus 2 by RT PCR 03/25/2024 NEGATIVE  NEGATIVE Final   Influenza A by PCR 03/25/2024 NEGATIVE  NEGATIVE Final   Influenza B by PCR 03/25/2024 NEGATIVE  NEGATIVE Final   Resp Syncytial Virus by PCR 03/25/2024 NEGATIVE  NEGATIVE Final   Troponin T High Sensitivity 03/25/2024 <15  0 - 19 ng/L Final   Pro Brain Natriuretic Peptide 03/25/2024 206.0  <300.0 pg/mL Final  Office Visit on 02/27/2024  Component Date Value Ref Range Status   FeNO level (ppb) 02/27/2024 24   Final  Clinical Support on 01/22/2024  Component Date Value Ref Range Status   FVC-Pre 01/22/2024 3.83  L Final   FVC-%Pred-Pre 01/22/2024 107  % Final   FVC-Post 01/22/2024 3.86  L Final   FVC-%Pred-Post 01/22/2024 107  % Final   FVC-%Change-Post 01/22/2024 0  % Final   FEV1-Pre 01/22/2024 2.54  L Final   FEV1-%Pred-Pre 01/22/2024 99  % Final   FEV1-Post 01/22/2024 2.63  L Final   FEV1-%Pred-Post  01/22/2024 102  % Final   FEV1-%Change-Post 01/22/2024 3  % Final   FEV6-Pre 01/22/2024 3.77  L Final   FEV6-%Pred-Pre 01/22/2024 113  % Final   FEV6-Post 01/22/2024 3.80  L Final   FEV6-%Pred-Post 01/22/2024 114  % Final   FEV6-%Change-Post 01/22/2024 0  % Final   Pre FEV1/FVC ratio 01/22/2024 66  % Final   FEV1FVC-%Pred-Pre 01/22/2024 91  % Final   Post FEV1/FVC ratio 01/22/2024 68  % Final   FEV1FVC-%Change-Post 01/22/2024 2  % Final   Pre FEV6/FVC Ratio 01/22/2024 98  % Final   FEV6FVC-%Pred-Pre 01/22/2024 105  % Final   Post FEV6/FVC ratio 01/22/2024 99  % Final   FEV6FVC-%Pred-Post 01/22/2024 106  % Final   FEV6FVC-%Change-Post  01/22/2024 0  % Final   FEF 25-75 Pre 01/22/2024 1.69  L/sec Final   FEF2575-%Pred-Pre 01/22/2024 93  % Final   FEF 25-75 Post 01/22/2024 1.72  L/sec Final   FEF2575-%Pred-Post 01/22/2024 95  % Final   FEF2575-%Change-Post 01/22/2024 2  % Final   RV 01/22/2024 3.48  L Final   RV % pred 01/22/2024 144  % Final   TLC 01/22/2024 7.21  L Final   TLC % pred 01/22/2024 113  % Final   DLCO unc 01/22/2024 19.43  ml/min/mmHg Final   DLCO unc % pred 01/22/2024 87  % Final   DL/VA 91/93/7974 6.83  ml/min/mmHg/L Final   DL/VA % pred 91/93/7974 79  % Final  Office Visit on 01/21/2024  Component Date Value Ref Range Status   Class Description Allergens 01/24/2024 Comment   Final   IgE (Immunoglobulin E), Serum 01/24/2024 31  6 - 495 IU/mL Final   D Pteronyssinus IgE 01/24/2024 <0.10  Class 0 kU/L Final   D Farinae IgE 01/24/2024 <0.10  Class 0 kU/L Final   Cat Dander IgE 01/24/2024 <0.10  Class 0 kU/L Final   Dog Dander IgE 01/24/2024 <0.10  Class 0 kU/L Final   Mouse Urine IgE 01/24/2024 <0.10  Class 0 kU/L Final   [place] Grass IgE 01/24/2024 <0.10  Class 0 kU/L Final   Timothy Grass IgE 01/24/2024 <0.10  Class 0 kU/L Final   Johnson Grass IgE 01/24/2024 <0.10  Class 0 kU/L Final   Cockroach, German IgE 01/24/2024 <0.10  Class 0 kU/L Final   Penicillium  Chrysogen IgE 01/24/2024 <0.10  Class 0 kU/L Final   Cladosporium Herbarum IgE 01/24/2024 <0.10  Class 0 kU/L Final   Aspergillus Fumigatus IgE 01/24/2024 <0.10  Class 0 kU/L Final   Alternaria Alternata IgE 01/24/2024 <0.10  Class 0 kU/L Final   Maple/Box Elder IgE 01/24/2024 <0.10  Class 0 kU/L Final   Common Silver Valrie IgE 01/24/2024 <0.10  Class 0 kU/L Final   Cedar, [place] IgE 01/24/2024 <0.10  Class 0 kU/L Final   Oak, White IgE 01/24/2024 <0.10  Class 0 kU/L Final   Elm, American IgE 01/24/2024 <0.10  Class 0 kU/L Final   Cottonwood IgE 01/24/2024 <0.10  Class 0 kU/L Final   Pecan, Hickory IgE 01/24/2024 <0.10  Class 0 kU/L Final   White Mulberry IgE 01/24/2024 <0.10  Class 0 kU/L Final   Ragweed, Short IgE 01/24/2024 <0.10  Class 0 kU/L Final   Pigweed, Rough IgE 01/24/2024 <0.10  Class 0 kU/L Final   Sheep Sorrel IgE Qn 01/24/2024 <0.10  Class 0 kU/L Final   WBC 01/24/2024 6.8  3.4 - 10.8 x10E3/uL Final   RBC 01/24/2024 4.35  4.14 - 5.80 x10E6/uL Final   Hemoglobin 01/24/2024 13.0  13.0 - 17.7 g/dL Final   Hematocrit 91/91/7974 39.4  37.5 - 51.0 % Final   MCV 01/24/2024 91  79 - 97 fL Final   MCH 01/24/2024 29.9  26.6 - 33.0 pg Final   MCHC 01/24/2024 33.0  31.5 - 35.7 g/dL Final   RDW 91/91/7974 13.9  11.6 - 15.4 % Final   Platelets 01/24/2024 211  150 - 450 x10E3/uL Final   Neutrophils 01/24/2024 60  Not Estab. % Final   Lymphs 01/24/2024 24  Not Estab. % Final   Monocytes 01/24/2024 7  Not Estab. % Final   Eos 01/24/2024 8  Not Estab. % Final   Basos 01/24/2024 1  Not Estab. % Final  Neutrophils Absolute 01/24/2024 4.1  1.4 - 7.0 x10E3/uL Final   Lymphocytes Absolute 01/24/2024 1.6  0.7 - 3.1 x10E3/uL Final   Monocytes Absolute 01/24/2024 0.5  0.1 - 0.9 x10E3/uL Final   EOS (ABSOLUTE) 01/24/2024 0.5 (H)  0.0 - 0.4 x10E3/uL Final   Basophils Absolute 01/24/2024 0.0  0.0 - 0.2 x10E3/uL Final   Immature Granulocytes 01/24/2024 0  Not Estab. % Final   Immature Grans  (Abs) 01/24/2024 0.0  0.0 - 0.1 x10E3/uL Final   IgG (Immunoglobin G), Serum 01/24/2024 481 (L)  603 - 1,613 mg/dL Final   IgA/Immunoglobulin A, Serum 01/24/2024 180  61 - 437 mg/dL Final   IgM (Immunoglobulin M), Srm 01/24/2024 25  15 - 143 mg/dL Final   Pneumo Ab Type 1* 01/24/2024 0.5 (L)  >1.3 ug/mL Final   Pneumo Ab Type 3* 01/24/2024 <0.1 (L)  >1.3 ug/mL Final   Pneumo Ab Type 4* 01/24/2024 0.4 (L)  >1.3 ug/mL Final   Pneumo Ab Type 8* 01/24/2024 15.1  >1.3 ug/mL Final   Pneumo Ab Type 9 (9N)* 01/24/2024 1.8  >1.3 ug/mL Final   Pneumo Ab Type 12 (73F)* 01/24/2024 <0.1 (L)  >1.3 ug/mL Final   Pneumo Ab Type 14* 01/24/2024 0.5 (L)  >1.3 ug/mL Final   Pneumo Ab Type 17 (48F)* 01/24/2024 0.5 (L)  >1.3 ug/mL Final   Pneumo Ab Type 19 (48F)* 01/24/2024 1.5  >1.3 ug/mL Final   Pneumo Ab Type 2* 01/24/2024 <0.2 (L)  >1.3 ug/mL Final   Pneumo Ab Type 20* 01/24/2024 0.5 (L)  >1.3 ug/mL Final   Pneumo Ab Type 22 (76F)* 01/24/2024 0.2 (L)  >1.3 ug/mL Final   Pneumo Ab Type 23 (5F)* 01/24/2024 0.8 (L)  >1.3 ug/mL Final   Pneumo Ab Type 26 (6B)* 01/24/2024 <0.1 (L)  >1.3 ug/mL Final   Pneumo Ab Type 34 (10A)* 01/24/2024 1.0 (L)  >1.3 ug/mL Final   Pneumo Ab Type 43 (11A)* 01/24/2024 1.1 (L)  >1.3 ug/mL Final   Pneumo Ab Type 5* 01/24/2024 1.0 (L)  >1.3 ug/mL Final   Pneumo Ab Type 51 (43F)* 01/24/2024 0.3 (L)  >1.3 ug/mL Final   Pneumo Ab Type 54 (15B)* 01/24/2024 1.3 (L)  >1.3 ug/mL Final   Pneumo Ab Type 56 (18C)* 01/24/2024 0.1 (L)  >1.3 ug/mL Final   Pneumo Ab Type 57 (19A)* 01/24/2024 4.2  >1.3 ug/mL Final   Pneumo Ab Type 68 (9V)* 01/24/2024 1.1 (L)  >1.3 ug/mL Final   Pneumo Ab Type 70 (29F)* 01/24/2024 0.2 (L)  >1.3 ug/mL Final   Tetanus Ab, IgG 01/24/2024 0.90  <0.10 IU/mL Final   Diphtheria Ab 01/24/2024 0.21  <0.10 IU/mL Final   Compl, Total (CH50) 01/24/2024 50  >41 U/mL Final  Lab on 04/18/2023  Component Date Value Ref Range Status   Vitamin B-12 04/18/2023 844  211 - 911 pg/mL  Final   Folate 04/18/2023 17.0  >5.9 ng/mL Final   WBC 04/18/2023 6.7  4.0 - 10.5 K/uL Final   RBC 04/18/2023 4.29  4.22 - 5.81 Mil/uL Final   Platelets 04/18/2023 207.0  150.0 - 400.0 K/uL Final   Hemoglobin 04/18/2023 12.0 (L)  13.0 - 17.0 g/dL Final   HCT 89/68/7975 37.1 (L)  39.0 - 52.0 % Final   MCV 04/18/2023 86.5  78.0 - 100.0 fl Final   MCHC 04/18/2023 32.3  30.0 - 36.0 g/dL Final   RDW 89/68/7975 15.0  11.5 - 15.5 % Final   Sodium 04/18/2023 142  135 - 145  mEq/L Final   Potassium 04/18/2023 4.4  3.5 - 5.1 mEq/L Final   Chloride 04/18/2023 106  96 - 112 mEq/L Final   CO2 04/18/2023 29  19 - 32 mEq/L Final   Glucose, Bld 04/18/2023 119 (H)  70 - 99 mg/dL Final   BUN 89/68/7975 26 (H)  6 - 23 mg/dL Final   Creatinine, Ser 04/18/2023 1.05  0.40 - 1.50 mg/dL Final   Total Bilirubin 04/18/2023 1.1  0.2 - 1.2 mg/dL Final   Alkaline Phosphatase 04/18/2023 49  39 - 117 U/L Final   AST 04/18/2023 19  0 - 37 U/L Final   ALT 04/18/2023 16  0 - 53 U/L Final   Total Protein 04/18/2023 6.1  6.0 - 8.3 g/dL Final   Albumin 89/68/7975 3.9  3.5 - 5.2 g/dL Final   GFR 89/68/7975 68.77  >60.00 mL/min Final   Calcium 04/18/2023 9.1  8.4 - 10.5 mg/dL Final   Cholesterol 89/68/7975 124  0 - 200 mg/dL Final   Triglycerides 89/68/7975 92.0  0.0 - 149.0 mg/dL Final   HDL 89/68/7975 45.30  >39.00 mg/dL Final   VLDL 89/68/7975 18.4  0.0 - 40.0 mg/dL Final   LDL Cholesterol 04/18/2023 60  0 - 99 mg/dL Final   Total CHOL/HDL Ratio 04/18/2023 3   Final   NonHDL 04/18/2023 78.68   Final   Magnesium 04/18/2023 2.0  1.5 - 2.5 mg/dL Final  Office Visit on 02/19/2023  Component Date Value Ref Range Status   SARS Coronavirus 2 Ag 02/19/2023 Negative  Negative Final  Office Visit on 01/18/2023  Component Date Value Ref Range Status   SARS Coronavirus 2 Ag 01/18/2023 Negative  Negative Final  Lab on 08/02/2022  Component Date Value Ref Range Status   Magnesium 08/02/2022 1.9  1.5 - 2.5 mg/dL Final    Cholesterol 97/84/7975 129  0 - 200 mg/dL Final   Triglycerides 97/84/7975 77.0  0.0 - 149.0 mg/dL Final   HDL 97/84/7975 48.30  >39.00 mg/dL Final   VLDL 97/84/7975 15.4  0.0 - 40.0 mg/dL Final   LDL Cholesterol 08/02/2022 65  0 - 99 mg/dL Final   Total CHOL/HDL Ratio 08/02/2022 3   Final   NonHDL 08/02/2022 80.50   Final   Sodium 08/02/2022 142  135 - 145 mEq/L Final   Potassium 08/02/2022 4.9  3.5 - 5.1 mEq/L Final   Chloride 08/02/2022 107  96 - 112 mEq/L Final   CO2 08/02/2022 27  19 - 32 mEq/L Final   Glucose, Bld 08/02/2022 113 (H)  70 - 99 mg/dL Final   BUN 97/84/7975 26 (H)  6 - 23 mg/dL Final   Creatinine, Ser 08/02/2022 1.04  0.40 - 1.50 mg/dL Final   Total Bilirubin 08/02/2022 1.0  0.2 - 1.2 mg/dL Final   Alkaline Phosphatase 08/02/2022 56  39 - 117 U/L Final   AST 08/02/2022 15  0 - 37 U/L Final   ALT 08/02/2022 12  0 - 53 U/L Final   Total Protein 08/02/2022 6.3  6.0 - 8.3 g/dL Final   Albumin 97/84/7975 4.0  3.5 - 5.2 g/dL Final   GFR 97/84/7975 69.91  >60.00 mL/min Final   Calcium 08/02/2022 9.4  8.4 - 10.5 mg/dL Final   WBC 97/84/7975 8.4  4.0 - 10.5 K/uL Final   RBC 08/02/2022 4.40  4.22 - 5.81 Mil/uL Final   Hemoglobin 08/02/2022 12.0 (L)  13.0 - 17.0 g/dL Final   HCT 97/84/7975 37.2 (L)  39.0 - 52.0 %  Final   MCV 08/02/2022 84.5  78.0 - 100.0 fl Final   MCHC 08/02/2022 32.2  30.0 - 36.0 g/dL Final   RDW 97/84/7975 17.5 (H)  11.5 - 15.5 % Final   Platelets 08/02/2022 220.0  150.0 - 400.0 K/uL Final   Neutrophils Relative % 08/02/2022 64.0  43.0 - 77.0 % Final   Lymphocytes Relative 08/02/2022 22.1  12.0 - 46.0 % Final   Monocytes Relative 08/02/2022 8.0  3.0 - 12.0 % Final   Eosinophils Relative 08/02/2022 5.1 (H)  0.0 - 5.0 % Final   Basophils Relative 08/02/2022 0.8  0.0 - 3.0 % Final   Neutro Abs 08/02/2022 5.3  1.4 - 7.7 K/uL Final   Lymphs Abs 08/02/2022 1.8  0.7 - 4.0 K/uL Final   Monocytes Absolute 08/02/2022 0.7  0.1 - 1.0 K/uL Final   Eosinophils  Absolute 08/02/2022 0.4  0.0 - 0.7 K/uL Final   Basophils Absolute 08/02/2022 0.1  0.0 - 0.1 K/uL Final   Vitamin B-12 08/02/2022 446  211 - 911 pg/mL Final   Folate 08/02/2022 11.2  >5.9 ng/mL Final  No image results found. DG Chest 2 View Result Date: 03/25/2024 CLINICAL DATA:  Cough. EXAM: CHEST - 2 VIEW COMPARISON:  12/23/2023 FINDINGS: Stable scarring at the lung apices. Otherwise, the lungs are clear. Multiple surgical clips in the left upper abdomen and along the midline of the chest. Heart size is stable. Trachea is midline. No pleural effusions. No acute bone abnormality. IMPRESSION: No active cardiopulmonary disease. Electronically Signed   By: Juliene Balder M.D.   On: 03/25/2024 11:15         ASSESSMENT & PLAN   Assessment & Plan Back pain at L4-L5 level Chronic lower back pain status post L4-L5 surgery   He experiences intermittent lower back pain exacerbated by physical activity, managed with sparing use of hydrocodone . Constant pain persists since the L4-L5 surgery, with no current involvement of a pain management specialist. Prescribe hydrocodone  5/325 mg, 45 tablets, for discretionary use. Discuss the risks of hydrocodone , including addiction, risk of death with alcohol, and the need for secure storage. PDMP reviewed during this encounter.  Pneumonitis due to inhalation of regurgitated food (HCC) Recurrent reflux-induced bronchitis with chronic cough   Chronic cough and bronchitis are secondary to reflux. A recent ER visit for exacerbation was treated with an albuterol  nebulizer, steroids, and antibiotics, resulting in symptom improvement. Occasional phlegm production persists, and he uses an albuterol  nebulizer at home. Discussed potential surgical interventions for reflux, such as adjustable gastric banding, and implementing lifestyle modifications like dietary changes and head elevation during sleep. Continue albuterol  nebulizer as needed. Consider referral for a second opinion  on surgical options if symptoms persist. Encourage continued lifestyle modifications. See AVS for nejm review of recommended lifestyle changes Offered referral for gastric banding to prevent further events- he declined. Ground glass opacity present on imaging of lung Ground glass pulmonary nodules, under surveillance   Ground glass nodules in the lungs are under surveillance. Discussed potential risks of lung damage from recurrent reflux and the importance of prevention. Continue surveillance with a CT scan next month and follow up as scheduled.  ORDER ASSOCIATIONS  #   DIAGNOSIS / CONDITION ICD-10 ENCOUNTER ORDER     ICD-10-CM   1. Back pain at L4-L5 level  M54.50 HYDROcodone -acetaminophen  (NORCO/VICODIN) 5-325 MG tablet    2. Pneumonitis due to inhalation of regurgitated food (HCC)  J69.0     3. Ground glass opacity present on imaging  of lung  R91.8        Meds ordered this encounter  Medications   HYDROcodone -acetaminophen  (NORCO/VICODIN) 5-325 MG tablet    Sig: Take 1 tablet by mouth every 6 (six) hours as needed for moderate pain (pain score 4-6) or severe pain (pain score 7-10).    Dispense:  45 tablet    Refill:  0       This document was synthesized by artificial intelligence ([employer]bridge) using HIPAA-compliant recording of the clinical interaction;   We discussed the use of AI scribe software for clinical note transcription with the patient, who gave verbal consent to proceed. additional Info: This encounter employed state-of-the-art, real-time, collaborative documentation. The patient actively reviewed and assisted in updating their electronic medical record on a shared screen, ensuring transparency and facilitating joint problem-solving for the problem list, overview, and plan. This approach promotes accurate, informed care. The treatment plan was discussed and reviewed in detail, including medication safety, potential side effects, and all patient questions. We confirmed  understanding and comfort with the plan. Follow-up instructions were established, including contacting the office for any concerns, returning if symptoms worsen, persist, or new symptoms develop, and precautions for potential emergency department visits.

## 2024-03-31 NOTE — Assessment & Plan Note (Signed)
 Recurrent reflux-induced bronchitis with chronic cough   Chronic cough and bronchitis are secondary to reflux. A recent ER visit for exacerbation was treated with an albuterol  nebulizer, steroids, and antibiotics, resulting in symptom improvement. Occasional phlegm production persists, and he uses an albuterol  nebulizer at home. Discussed potential surgical interventions for reflux, such as adjustable gastric banding, and implementing lifestyle modifications like dietary changes and head elevation during sleep. Continue albuterol  nebulizer as needed. Consider referral for a second opinion on surgical options if symptoms persist. Encourage continued lifestyle modifications. See AVS for nejm review of recommended lifestyle changes Offered referral for gastric banding to prevent further events- he declined.

## 2024-04-02 ENCOUNTER — Telehealth: Payer: Self-pay

## 2024-04-02 DIAGNOSIS — B999 Unspecified infectious disease: Secondary | ICD-10-CM

## 2024-04-02 NOTE — Telephone Encounter (Signed)
 Copied from CRM #[id number]. Topic: Appointments - Scheduling Inquiry for Clinic >> Apr 01, 2024 12:47 PM Jordan [name] wrote: Reason for CRM: patient called in to schedule [name] CT scan stated Dr. Meade requested it for him, he also stated that she requested Labs for him as well, I dont see orders in here for labs yet  Dr Meade, what labs would you like drawn? The lov does state that you would like to repeat after 4 weeks but the only thing I saw with your name on it was [name] feno listed underneath labs.

## 2024-04-03 ENCOUNTER — Ambulatory Visit
Admission: RE | Admit: 2024-04-03 | Discharge: 2024-04-03 | Disposition: A | Discharge status: Home / Self Care | Source: Ambulatory Visit | Attending: Internal Medicine | Admitting: Internal Medicine

## 2024-04-03 DIAGNOSIS — J189 Pneumonia, unspecified organism: Secondary | ICD-10-CM | POA: Diagnosis not present

## 2024-04-03 DIAGNOSIS — R9389 Abnormal findings on diagnostic imaging of other specified body structures: Secondary | ICD-10-CM

## 2024-04-03 NOTE — Telephone Encounter (Signed)
 Dr. Orlan in allergy and immunology was supposed to order follow up labs

## 2024-04-05 NOTE — Telephone Encounter (Signed)
 Please call patient and have him recheck his pneumococcal titers if it's been more than 4-6 weeks since he got his pneumonia shot.   Keep follow up in November to discuss further.

## 2024-04-06 NOTE — Telephone Encounter (Signed)
 I called the patient and left a message to call the office back to inform of message.

## 2024-04-07 ENCOUNTER — Ambulatory Visit (INDEPENDENT_AMBULATORY_CARE_PROVIDER_SITE_OTHER): Admitting: Family

## 2024-04-07 ENCOUNTER — Encounter: Payer: Self-pay | Admitting: Family

## 2024-04-07 VITALS — BP 188/100 | HR 64 | Temp 98.2°F | Ht 66.0 in | Wt 170.6 lb

## 2024-04-07 DIAGNOSIS — I10 Essential (primary) hypertension: Secondary | ICD-10-CM

## 2024-04-07 DIAGNOSIS — J410 Simple chronic bronchitis: Secondary | ICD-10-CM

## 2024-04-07 MED ORDER — PREDNISONE 20 MG PO TABS: ORAL_TABLET | ORAL | 0 refills | Status: DC

## 2024-04-07 MED ORDER — CLONIDINE HCL 0.1 MG PO TABS
0.1000 mg | ORAL_TABLET | Freq: Once | ORAL | Status: AC
  Administered 2024-04-07: 0.1 mg via ORAL

## 2024-04-07 NOTE — Patient Instructions (Addendum)
 It was very nice to see you today!   Your blood pressure is too high!  Take one of your Amlodipine  pills when you get home today. Take the Amlodipine  every morning for the next week, then follow up with Dr Jesus if you need to remain on this. Do your Duoneb nebulizer treatment when you get home, then again in 4 hours and prior to bedtime.  Starting tomorrow through Monday: Use the Wixela inhaler in the morning 8am: 1 puff. Use the Duoneb nebulizer 4 hours later (12pm). Use the Wixela inhaler again 1 puff at 4pm. Use the Duoneb nebulizer 4 hours later (8pm). Use the Duoneb treatment again before bedtime- ok if not 4 hours from last time.     PLEASE NOTE:  If you had any lab tests please let us  know if you have not heard back within a few days. You may see your results on MyChart before we have a chance to review them but we will give you a call once they are reviewed by us . If we ordered any referrals today, please let us  know if you have not heard from their office within the next week.

## 2024-04-07 NOTE — Progress Notes (Signed)
 Patient ID: Jordan [name], male    DOB: May 08, 1946, 78 y.o.   MRN: [id number]  Chief Complaint  Patient presents with   Chronic bronchitis, unspecified chronic bronchitis type    Pt c/o Cough, SOB and chest congestion, present for years. Has tried inhalers and nebulizer, which does help slightly.   Discussed the use of AI scribe software for clinical note transcription with the patient, who gave verbal consent to proceed.  History of Present Illness Jordan [name] is a 78 year old male with Barrett's esophagus who presents with persistent cough and bronchitis.  He has a persistent productive cough with bronchitis, requiring deep coughing to expectorate white and clear phlegm, causing chest soreness. He uses a nebulizer once daily, which helps loosen the phlegm, along with an inhaler and nasal sprays.  He underwent esophagectomy 35 years ago for Barrett's esophagus, with his stomach reattached to the remaining esophagus. He experiences reflux, which can aspirate into his lungs, especially when lying flat. He takes Protonix  daily for reflux management, with certain foods and alcohol exacerbating his symptoms.  He was hospitalized on October 8th, receiving prednisone , antibiotics, and nebulizer treatments. A steroid shot significantly improved his symptoms.  He has high blood pressure, typically between 120-140 mmHg when not on prednisone , and takes amlodipine  5 mg as needed. He previously took telmisartan . No heartburn or scratchy throat with reflux.  Assessment & Plan Chronic bronchitis Chronic bronchitis with clear sputum, recent exacerbation treated with prednisone  and antibiotics in ED on 10/8, seen again 10/14 by PCP & doing better. Has been using Duoneb treatment just once a day. Has generic Adviar inhaler but not using. Current symptoms suggest viral etiology. No pneumonia on X-ray a few weeks ago. Advised of health risks r/t repeated doses of antibiotics & prednisone  & importance of using  his COPD meds as directed. - Increase DuoNeb nebulizer to 3 times per day for at least 5 days or until cough improved, then daily. - Start Wixela inhaler 1 puff twice a day, alternating with the neb treatments, schedule written out for patient. - Refill prednisone  40mg  x 5d, then 20mg  x2d. Take after breakfast. - Increase water intake to at least 2 liters daily. - Report progress by Friday.  Essential hypertension Elevated blood pressure likely due to coughing and steroid use. Typically controlled with amlodipine , but does not take daily. Very high BP in office.  - Administer clonidine 0.1mg  in-office. - Repeat BP still high in office. Advised to go home and take his Amlodipine .  - Take the Amlodipine  daily, especially while on the prednisone . - Ensure adequate daily hydration and low sodium diet. - Follow up with PCP  Gastroesophageal reflux disease after esophagectomy GERD symptoms exacerbated by diet and sleep position. Aspiration risk due to post-surgical anatomical changes. - Continue Protonix  daily as prescribed. - Eat slow bites, drinking after every bite. - Do not lay down for at least 1 hour after eating. - Elevate head of bed.  Subjective:    Outpatient Medications Prior to Visit  Medication Sig Dispense Refill   albuterol  (VENTOLIN  HFA) 108 (90 Base) MCG/ACT inhaler Inhale 2 puffs into the lungs every 6 (six) hours as needed for wheezing or shortness of breath. 1 each 1   Famotidine-Ca Carb-Mag Hydrox (PEPCID COMPLETE PO) Take by mouth daily.     fluticasone -salmeterol (ADVAIR HFA) 230-21 MCG/ACT inhaler Inhale 2 puffs into the lungs 2 (two) times daily. 1 each 12   HYDROcodone -acetaminophen  (NORCO/VICODIN) 5-325 MG tablet Take 1 tablet by mouth  every 6 (six) hours as needed for moderate pain (pain score 4-6) or severe pain (pain score 7-10). 45 tablet 0   ibuprofen (ADVIL) 200 MG tablet Take 200 mg by mouth 4 (four) times daily.     ipratropium (ATROVENT ) 0.06 % nasal spray  Place 1-2 sprays into both nostrils 3 (three) times daily as needed (runny nose/drainage). 15 mL 3   ipratropium-albuterol  (DUONEB) 0.5-2.5 (3) MG/3ML SOLN Take 3 mLs by nebulization every 6 (six) hours as needed. 270 mL 1   mometasone  (NASONEX ) 50 MCG/ACT nasal spray Place 1-2 sprays into the nose daily. For nasal congestion 1 each 3   pantoprazole  (PROTONIX ) 40 MG tablet Take 1 tablet (40 mg total) by mouth daily. 90 tablet 3   sildenafil (VIAGRA) 100 MG tablet      sodium chloride  0.9 % nebulizer solution Take 3 mLs by nebulization as needed for wheezing. 90 mL 3   No facility-administered medications prior to visit.   Past Medical History:  Diagnosis Date   Allergy    Arthritis    back, shoulder- both    Barrett's esophagus    Barrett's esophagus with dysplasia 07/24/2021   BPH (benign prostatic hyperplasia)    Chronic lower back pain    Herniated disc at L4   Chronic pain 07/24/2021   Chronic rhinitis    Chronic sinusitis 11/11/2018   ED (erectile dysfunction) of organic origin    GERD (gastroesophageal reflux disease)    Barrett's Esophagus, treated /w esophagectomy - 1998, continues toi have reflux & uses Zantac on a regular basis    Hemoptysis 11/11/2018   History of wheezing 11/11/2018   Hypertension 10/20/21   Neuromuscular disorder (HCC) 2011   Tingling feet   Neuropathy 06/18/2016   Pneumonitis due to inhalation of food or vomitus (HCC) 07/30/2022   Thyroid  disease    Tinnitus    hearing loss in both ears    Urinary tract infectious disease 08/13/2022   Past Surgical History:  Procedure Laterality Date   CATARACT EXTRACTION Bilateral 2019   Dr. Lelon   ESOPHAGECTOMY     1998- at [facility], /w epidural & sedation & pain management /w continued  epidural    EYE SURGERY  2018   Cataracts   HERNIA REPAIR     LUMBAR LAMINECTOMY/DECOMPRESSION MICRODISCECTOMY  03/22/2012   Procedure: LUMBAR LAMINECTOMY/DECOMPRESSION MICRODISCECTOMY 1 LEVEL;  Surgeon: Alm GORMAN Molt, MD;   Location: [facility];  Service: Neurosurgery;  Laterality: Right;  right lumbar four-five extra-foraminal microdiscectomy   SPINE SURGERY  2013   Ruptured disc L4   Allergies  Allergen Reactions   Latex    Adhesive [Tape] Rash      Objective:    Physical Exam Vitals and nursing note reviewed.  Constitutional:      General: He is not in acute distress.    Appearance: Normal appearance.  HENT:     Head: Normocephalic.  Cardiovascular:     Rate and Rhythm: Normal rate and regular rhythm.  Pulmonary:     Effort: Pulmonary effort is normal.     Breath sounds: Examination of the right-upper field reveals rhonchi. Examination of the left-upper field reveals rhonchi. Examination of the right-middle field reveals wheezing. Examination of the left-middle field reveals wheezing. Examination of the right-lower field reveals wheezing. Wheezing (expiratory) and rhonchi present.  Musculoskeletal:        General: Normal range of motion.     Cervical back: Normal range of motion.  Skin:  General: Skin is warm and dry.  Neurological:     Mental Status: He is alert and oriented to person, place, and time.  Psychiatric:        Mood and Affect: Mood normal.    BP (!) 188/100   Pulse 64   Temp 98.2 F (36.8 C) (Temporal)   Ht 5' 6 (1.676 m)   Wt 170 lb 9.6 oz (77.4 kg)   SpO2 97%   BMI 27.54 kg/m  Wt Readings from Last 3 Encounters:  04/07/24 170 lb 9.6 oz (77.4 kg)  03/31/24 171 lb 9.6 oz (77.8 kg)  03/25/24 171 lb 9.6 oz (77.8 kg)   *I personally spent a total of 39 minutes in the care of the patient today including preparing to see the patient, getting/reviewing separately obtained history, performing a medically appropriate exam/evaluation, counseling and educating, writing detailed home instructions, placing orders, and documenting clinical information in the EHR. Time for observation of treatment effectiveness of Clonidine given in office.    Lucius Krabbe, NP

## 2024-04-08 ENCOUNTER — Ambulatory Visit: Payer: Self-pay | Admitting: Internal Medicine

## 2024-04-10 ENCOUNTER — Telehealth: Payer: Self-pay

## 2024-04-10 NOTE — Telephone Encounter (Signed)
 My chart message sent

## 2024-04-10 NOTE — Telephone Encounter (Signed)
 Copied from CRM #[id number]. Topic: Clinical - Medication Question >> Apr 10, 2024  8:55 AM Pinkey ORN wrote: Patient called in states his mucus has now changed from clear to yellow and is wanting to know if he can now be prescribed the antibiotic. Please follow up with patient.

## 2024-04-11 ENCOUNTER — Encounter: Payer: Self-pay | Admitting: Internal Medicine

## 2024-04-13 ENCOUNTER — Encounter: Payer: Self-pay | Admitting: Allergy

## 2024-04-20 ENCOUNTER — Telehealth: Payer: Self-pay | Admitting: Allergy

## 2024-04-20 DIAGNOSIS — B999 Unspecified infectious disease: Secondary | ICD-10-CM

## 2024-04-20 NOTE — Telephone Encounter (Signed)
 Patient at [facility]. Reordered labs.

## 2024-04-23 ENCOUNTER — Other Ambulatory Visit: Payer: Self-pay

## 2024-04-23 ENCOUNTER — Ambulatory Visit: Admitting: Allergy

## 2024-04-23 ENCOUNTER — Encounter: Payer: Self-pay | Admitting: Allergy

## 2024-04-23 VITALS — BP 160/70 | HR 68 | Temp 98.0°F | Resp 18 | Ht 66.0 in | Wt 171.0 lb

## 2024-04-23 DIAGNOSIS — J310 Chronic rhinitis: Secondary | ICD-10-CM | POA: Diagnosis not present

## 2024-04-23 DIAGNOSIS — B999 Unspecified infectious disease: Secondary | ICD-10-CM | POA: Diagnosis not present

## 2024-04-23 DIAGNOSIS — R030 Elevated blood-pressure reading, without diagnosis of hypertension: Secondary | ICD-10-CM | POA: Diagnosis not present

## 2024-04-23 DIAGNOSIS — J42 Unspecified chronic bronchitis: Secondary | ICD-10-CM | POA: Diagnosis not present

## 2024-04-23 DIAGNOSIS — K219 Gastro-esophageal reflux disease without esophagitis: Secondary | ICD-10-CM

## 2024-04-23 MED ORDER — IPRATROPIUM BROMIDE 0.06 % NA SOLN: 1.0000 | Freq: Three times a day (TID) | NASAL | 5 refills | Status: AC | PRN

## 2024-04-23 MED ORDER — TRIAMCINOLONE ACETONIDE 55 MCG/ACT NA AERO: 1.0000 | INHALATION_SPRAY | Freq: Every day | NASAL | 5 refills | Status: AC | PRN

## 2024-04-23 NOTE — Progress Notes (Signed)
 Follow Up Note  RE: Jordan [name] MRN: [id number] DOB: 05/21/1946 Date of Office Visit: 04/23/2024  Referring provider: Jesus Bernardino MATSU, MD Primary care provider: Jesus Bernardino MATSU, MD  Chief Complaint: Follow-up (Pt presents to the office with wife. Pt reports no concerns and would like to go over lab results.)  History of Present Illness: I had the pleasure of seeing Jordan [name] for a follow up visit at the [facility] on 04/23/2024. He is a 79 y.o. male, who is being followed for chronic rhinitis, chronic cough, recurrent infections, GERD. His previous allergy office visit was on 01/21/2024 with Dr. Luke. Today is a regular follow up visit. He is accompanied today by his wife who provided/contributed to the history.   Discussed the use of AI scribe software for clinical note transcription with the patient, who gave verbal consent to proceed.    In August, he had a blood panel that revealed low pneumococcal titers. He received a prevnar 20 and was scheduled for follow-up blood work four to six weeks later. The results of this follow-up are still pending to determine the vaccine's efficacy.  He experiences reflux, which led to chest congestion last night. He uses two inhalers and a nebulizer with albuterol  as needed. He completed a course of prednisone  for bronchitis but did not require antibiotics.  His IgG levels were reported to be lower than normal in recent blood work. He uses Nasacort and Atrovent  nasal sprays and has stopped using Sudafed. Advair has been helping his breathing. He has adjusted his sleeping position and diet to manage reflux, which has helped reduce symptoms.  He takes medication to manage his blood pressure. He experienced some lung congestion and a raspy voice due to reflux last night.  No recent antibiotic use. He confirms using inhalers and nebulizers as needed and has completed a course of prednisone . He reports a reduction in reflux episodes and  has been managing it with lifestyle changes.      04/03/2024 CT chest: IMPRESSION: 1. Scattered ground-glass opacities in the dependent bilateral lung bases are similar to prior examination although minimally fluctuant. Previously seen opacities in the right upper lobe are almost completely resolved. Findings are consistent with ongoing infection or aspiration. 2. Diffuse bilateral bronchial wall thickening, consistent with nonspecific infectious or inflammatory bronchitis. 3. Pull-through esophagectomy. Pull-through and remnant upper esophagus are fluid and debris-filled which may place the patient at risk for aspiration. 4. Cholelithiasis.  02/27/2024 pulm visit: Chronic bronchitis with recurrent exacerbations and asthma secondary to chronic bronchitis Chronic bronchitis with increasing exacerbations likely due to reflux aspiration, secondhand smoke, and occupational exposures. CT shows airway inflammation and ground-glass opacities. Symptoms improve with steroids and antibiotics. Developing asthma secondary to chronic bronchitis, not COPD as he is a never smoker. - Order repeat CT scan in October, ensure he is not acutely ill to avoid false positives. - Administer Pneumovax 20 today. - If symptoms recur, initiate Advair inhaler, two puffs in the morning and two puffs at night. - Perform additional breathing test today to assess for allergic inflammation. Feno today was not elevated - If allergic inflammation is present, consider maintaining Advair inhaler use even when asymptomatic. Feno today 24 ppb, not elevated or consitsent with TH2 inflammation   Gastroesophageal reflux with aspiration risk after esophagectomy Chronic bronchitis exacerbations likely related to aspiration from gastroesophageal reflux, especially given history of esophagectomy.   Occupational and environmental exposure to toxic agents including secondhand smoke Significant secondhand smoke and  occupational  exposure to hazardous chemicals potentially contributing to chronic bronchitis and asthma. Discussed PFAS exposure and potential health impacts. - Provide free text lab order for PFAS testing if [facility] can perform the test.   Immunodeficiency with low IgG Low IgG levels possibly contributing to increased infection risk. Plan to assess response to pneumococcal vaccination before considering IgG replacement therapy. - Administer Pneumovax 20 today. - Coordinate with Dr. Luke to repeat blood work four weeks post-vaccination to assess response. - Consider IgG replacement therapy if infections persist and IgG levels remain low.  02/07/2024 pulm visit: 78 year old pleasant white male seen today for assessment of shortness of breath increased work of breathing productive cough with wheezing and increased shortness of breath likely related to COPD exacerbation likely from inflammation and atypical infection with abnormal CT chest with right lung glass opacification   Acute COPD exacerbation In the office we gave nebulizer as well as Depo-Medrol  shot Prednisone  20 mg daily for 10 days Start azithromycin  Recommend starting Advair HFA 2 puffs in the morning 2 puffs at night Rinse mouth after use   Abnormal CT chest with right lung groundglass opacification Recommend follow-up CT chest in 2 months for interval assessment and changes  2025 labs: Your blood count was normal which is great. You also have good protection against diptheria and tetanus. Negative to indoor/outdoor allergies.    Your pneumococcal titers were low. Sometimes people with low titers are more likely to develop respiratory infections caused by the bacteria strep pneumoniae. I would like for you to get the pneumovax 23 vaccine (also known as the pneumonia shot) as it can boost the levels and offer protection against this bacteria in the future. Once you get the vaccine, we check the levels 4 weeks afterwards to make sure your  immune system responded to the vaccine appropriately. You can get the pneumovax vaccine at your PCP's office or pharmacy. If they don't offer it there, let us  know and in certain cases we have given them in our office.    Make sure it's the pneumovax 23 vaccine and NOT the prevnar.    Your IgG level was lower than normal - 481. Sometimes people with lower IgG are more likely to get infections. At this point, I would like for you to get the pneumonia shot first and see how you respond to that.   If you are still having a lot of infections and require frequent antibiotics we can explore options to see if insurance would cover IgG replacement therapy. We can talk more about this at the next visit.   Assessment and Plan: Jordan [name] is a 78 y.o. male with: Recurrent infections Past history - Usually gets bronchitis 2 times per year.  Interim history - 2025 labs IgG 481, poor pneumococcal titers. Got prevnar 20 and post titers pending. Had zpak x 2 per EMR records.   Keep track of infections and antibiotics use. Briefly discussed IgG replacement therapy. Still awaiting lab results.    Chronic rhinitis  Past history - Chronic rhinitis symptoms with PND. 2020 skin testing was negative to environmental panel. Questions if this may be contributing to his aspirations. Interim history - 2025 labs negative to environmental panel. Using nasal sprays which help.  Use Nasacort (triamcinolone) nasal spray 1-2 sprays per nostril once a day as needed for nasal congestion.  Use Atrovent  (ipratropium) 0.06% 1-2 sprays per nostril three a day as needed for runny nose/drainage.   Chronic bronchitis Past history - Being followed by pulmonology  and had some abnormal CT chest in the past. 2025 PFTs - mild copd.  Interim history - started on Advair and doing slightly better.  Follow up with pulmonology as scheduled.  Daily controller medication(s): advair 230mcg 2 puffs twice a day and rinse mouth after each use.   May use albuterol  rescue inhaler 2 puffs or nebulizer every 4 to 6 hours as needed for shortness of breath, chest tightness, coughing, and wheezing. May use albuterol  rescue inhaler 2 puffs 5 to 15 minutes prior to strenuous physical activities. Monitor frequency of use - if you need to use it more than twice per week on a consistent basis let us  know.   Gastroesophageal reflux disease, unspecified whether esophagitis present Aspiration concerns Past history - Patient had esophageal surgery many years ago and recently having issues with aspirations. Follows with GI as well. Interim history - doing better with lifestyle changes and elevated head of bed but had an episode last night which flared his breathing.  Continue lifestyle and dietary modifications. Continue pantoprazole  40mg  once day and Pepcid daily as before.  Consider repeat GI evaluation.   Elevated blood pressure reading Please follow up with PCP regarding this.    Return in about 4 months (around 08/21/2024).  Meds ordered this encounter  Medications   ipratropium (ATROVENT ) 0.06 % nasal spray    Sig: Place 1-2 sprays into both nostrils 3 (three) times daily as needed (runny nose/drainage).    Dispense:  15 mL    Refill:  5   triamcinolone (NASACORT ALLERGY 24HR) 55 MCG/ACT AERO nasal inhaler    Sig: Place 1-2 sprays into the nose daily as needed (nasal congestion).    Dispense:  1 each    Refill:  5   Lab Orders  No laboratory test(s) ordered today    Diagnostics: None.    Medication List:  Current Outpatient Medications  Medication Sig Dispense Refill   albuterol  (VENTOLIN  HFA) 108 (90 Base) MCG/ACT inhaler Inhale 2 puffs into the lungs every 6 (six) hours as needed for wheezing or shortness of breath. 1 each 1   Famotidine-Ca Carb-Mag Hydrox (PEPCID COMPLETE PO) Take by mouth daily.     fluticasone -salmeterol (ADVAIR HFA) 230-21 MCG/ACT inhaler Inhale 2 puffs into the lungs 2 (two) times daily. 1 each 12    HYDROcodone -acetaminophen  (NORCO/VICODIN) 5-325 MG tablet Take 1 tablet by mouth every 6 (six) hours as needed for moderate pain (pain score 4-6) or severe pain (pain score 7-10). 45 tablet 0   ibuprofen (ADVIL) 200 MG tablet Take 200 mg by mouth 4 (four) times daily.     ipratropium-albuterol  (DUONEB) 0.5-2.5 (3) MG/3ML SOLN Take 3 mLs by nebulization every 6 (six) hours as needed. 270 mL 1   mometasone  (NASONEX ) 50 MCG/ACT nasal spray Place 1-2 sprays into the nose daily. For nasal congestion 1 each 3   pantoprazole  (PROTONIX ) 40 MG tablet Take 1 tablet (40 mg total) by mouth daily. 90 tablet 3   sildenafil (VIAGRA) 100 MG tablet      sodium chloride  0.9 % nebulizer solution Take 3 mLs by nebulization as needed for wheezing. 90 mL 3   triamcinolone (NASACORT ALLERGY 24HR) 55 MCG/ACT AERO nasal inhaler Place 1-2 sprays into the nose daily as needed (nasal congestion). 1 each 5   ipratropium (ATROVENT ) 0.06 % nasal spray Place 1-2 sprays into both nostrils 3 (three) times daily as needed (runny nose/drainage). 15 mL 5   No current facility-administered medications for this visit.   Allergies:  Allergies  Allergen Reactions   Latex    Adhesive [Tape] Rash   I reviewed his past medical history, social history, family history, and environmental history and no significant changes have been reported from his previous visit.  Review of Systems  Constitutional:  Negative for appetite change, chills, fever and unexpected weight change.  HENT:  Positive for congestion and postnasal drip. Negative for rhinorrhea.   Eyes:  Negative for itching.  Respiratory:  Positive for cough and wheezing. Negative for chest tightness and shortness of breath.   Cardiovascular:  Negative for chest pain.  Gastrointestinal:  Negative for abdominal pain.  Genitourinary:  Negative for difficulty urinating.  Skin:  Negative for rash.  Neurological:  Negative for headaches.    Objective: BP (!) 160/70 (BP Location:  Left Arm, Patient Position: Sitting, Cuff Size: Normal)   Pulse 68   Temp 98 F (36.7 C) (Temporal)   Resp 18   Ht 5' 6 (1.676 m)   Wt 171 lb (77.6 kg)   SpO2 97%   BMI 27.60 kg/m  Body mass index is 27.6 kg/m. Physical Exam Vitals and nursing note reviewed.  Constitutional:      Appearance: Normal appearance. He is well-developed.  HENT:     Head: Normocephalic and atraumatic.     Right Ear: Tympanic membrane and external ear normal.     Left Ear: Tympanic membrane and external ear normal.     Nose: Nose normal.     Mouth/Throat:     Mouth: Mucous membranes are moist.     Pharynx: Oropharynx is clear.  Eyes:     Conjunctiva/sclera: Conjunctivae normal.  Cardiovascular:     Rate and Rhythm: Normal rate and regular rhythm.     Heart sounds: Normal heart sounds. No murmur heard.    No friction rub. No gallop.  Pulmonary:     Effort: Pulmonary effort is normal.     Breath sounds: Rales present. No wheezing or rhonchi.  Musculoskeletal:     Cervical back: Neck supple.  Skin:    General: Skin is warm.     Findings: No rash.  Neurological:     Mental Status: He is alert and oriented to person, place, and time.  Psychiatric:        Behavior: Behavior normal.    Previous notes and tests were reviewed. The plan was reviewed with the patient/family, and all questions/concerned were addressed.  It was my pleasure to see Jordan [name] today and participate in his care. Please feel free to contact me with any questions or concerns.  Sincerely,  Orlan Cramp, DO Allergy & Immunology  [facility]  [place] office: [phone] [facility] office: [phone]

## 2024-04-23 NOTE — Patient Instructions (Addendum)
 Infections Keep track of infections and antibiotics use. I'll send you a MyChart message once I get the lab results.  Rhinitis Use Nasacort (triamcinolone) nasal spray 1-2 sprays per nostril once a day as needed for nasal congestion.  Use Atrovent  (ipratropium) 0.06% 1-2 sprays per nostril three a day as needed for runny nose/drainage.  Breathing Follow up with pulmonology as scheduled.  Daily controller medication(s): advair 230mcg 2 puffs twice a day and rinse mouth after each use.  May use albuterol  rescue inhaler 2 puffs or nebulizer every 4 to 6 hours as needed for shortness of breath, chest tightness, coughing, and wheezing. May use albuterol  rescue inhaler 2 puffs 5 to 15 minutes prior to strenuous physical activities. Monitor frequency of use - if you need to use it more than twice per week on a consistent basis let us  know.  Breathing control goals:  Full participation in all desired activities (may need albuterol  before activity) Albuterol  use two times or less a week on average (not counting use with activity) Cough interfering with sleep two times or less a month Oral steroids no more than once a year No hospitalizations   Reflux Continue lifestyle and dietary modifications. Continue pantoprazole  40mg  once day and Pepcid daily as before.  Consider repeat GI evaluation.   Aspirations  Monitor symptoms.  Follow up in 4 months or sooner if needed.   Elevated blood pressure  Blood pressure reading was high in our office today. Vitals:   04/23/24 1055  BP: (!) 160/70   Please follow up with PCP regarding this.

## 2024-04-24 LAB — IGG, IGA, IGM
IgG (Immunoglobin G), Serum: 494 mg/dL — ABNORMAL LOW (ref 603–1613)
IgM (Immunoglobulin M), Srm: 30 mg/dL (ref 15–143)
Immunoglobulin A, (IgA) QN, Serum: 179 mg/dL (ref 61–437)

## 2024-04-24 LAB — STREP PNEUMONIAE 23 SEROTYPES IGG
Pneumo Ab Type 1*: 0.7 ug/mL — AB (ref >1.3)
Pneumo Ab Type 12 (12F)*: 0.3 ug/mL — AB (ref >1.3)
Pneumo Ab Type 14*: 1.4 ug/mL (ref >1.3)
Pneumo Ab Type 17 (17F)*: 0.7 ug/mL — AB (ref >1.3)
Pneumo Ab Type 19 (19F)*: 2 ug/mL (ref >1.3)
Pneumo Ab Type 2*: 0.4 ug/mL — AB (ref >1.3)
Pneumo Ab Type 20*: 1.3 ug/mL — AB (ref >1.3)
Pneumo Ab Type 22 (22F)*: 15.7 ug/mL (ref >1.3)
Pneumo Ab Type 23 (23F)*: 11.1 ug/mL (ref >1.3)
Pneumo Ab Type 26 (6B)*: 4.3 ug/mL (ref >1.3)
Pneumo Ab Type 3*: 0.1 ug/mL — AB (ref >1.3)
Pneumo Ab Type 34 (10A)*: 10 ug/mL (ref >1.3)
Pneumo Ab Type 4*: 6.4 ug/mL (ref >1.3)
Pneumo Ab Type 43 (11A)*: 1.2 ug/mL — AB (ref >1.3)
Pneumo Ab Type 5*: 1.6 ug/mL (ref >1.3)
Pneumo Ab Type 51 (7F)*: 2.2 ug/mL (ref >1.3)
Pneumo Ab Type 54 (15B)*: 5.6 ug/mL (ref >1.3)
Pneumo Ab Type 56 (18C)*: 2 ug/mL (ref >1.3)
Pneumo Ab Type 57 (19A)*: 6 ug/mL (ref >1.3)
Pneumo Ab Type 68 (9V)*: 2.8 ug/mL (ref >1.3)
Pneumo Ab Type 70 (33F)*: 0.5 ug/mL — AB (ref >1.3)
Pneumo Ab Type 8*: 14 ug/mL (ref >1.3)
Pneumo Ab Type 9 (9N)*: 2.2 ug/mL (ref >1.3)

## 2024-04-27 ENCOUNTER — Ambulatory Visit: Payer: Self-pay | Admitting: Allergy

## 2024-04-30 ENCOUNTER — Encounter: Payer: Self-pay | Admitting: Internal Medicine

## 2024-04-30 ENCOUNTER — Ambulatory Visit (INDEPENDENT_AMBULATORY_CARE_PROVIDER_SITE_OTHER): Admitting: Internal Medicine

## 2024-04-30 VITALS — BP 127/69 | HR 57 | Temp 97.6°F | Ht 66.0 in | Wt 169.4 lb

## 2024-04-30 DIAGNOSIS — J410 Simple chronic bronchitis: Secondary | ICD-10-CM | POA: Diagnosis not present

## 2024-04-30 DIAGNOSIS — Z9049 Acquired absence of other specified parts of digestive tract: Secondary | ICD-10-CM | POA: Diagnosis not present

## 2024-04-30 DIAGNOSIS — D801 Nonfamilial hypogammaglobulinemia: Secondary | ICD-10-CM

## 2024-04-30 DIAGNOSIS — K219 Gastro-esophageal reflux disease without esophagitis: Secondary | ICD-10-CM

## 2024-04-30 DIAGNOSIS — Z9889 Other specified postprocedural states: Secondary | ICD-10-CM | POA: Diagnosis not present

## 2024-04-30 NOTE — Patient Instructions (Addendum)
 It was a pleasure to see you today!  Please schedule follow up with Dr. Zaida in 6 months. Please call sooner [phone] if issues or concerns arise. You can also send us  a message through MyChart, but but aware that this is not to be used for urgent issues and it may take up to 5-7 days to receive a reply. Please be aware that you will likely be able to view your results before I have a chance to respond to them. Please give us  5 business days to respond to any non-urgent results.     VISIT SUMMARY: You came in today for a follow-up after your recent CT scan and allergy blood work. We discussed your breathing, hypogammaglobulinemia, reflux symptoms, and sleep issues.  YOUR PLAN: -HYPOGAMMAGLOBULINEMIA WITH RECURRENT RESPIRATORY INFECTIONS: Hypogammaglobulinemia is a condition where your body doesn't produce enough antibodies, making you more prone to infections. Your IgG level is below normal, and despite vaccination, your pneumococcal titers are still low. We discussed starting IVIG therapy, which can help boost your immunity. You will be referred to Dr. Luke for IVIG infusions, and we will monitor your response to this treatment over the next six months.  -GASTROESOPHAGEAL REFLUX FOLLOWING ESOPHAGECTOMY WITH GASTRIC PULL-THROUGH AND ASPIRATION-RELATED LUNG CHANGES: After your esophagectomy, you have some reflux symptoms and lung changes due to aspiration. The CT scan showed ground glass nodules in your lung bases, but there is no cancer. Your coughing is likely related to reflux. No follow-up is needed for the nodules, but you should monitor your respiratory symptoms and seek medical attention if they worsen.  -INSOMNIA: Insomnia is difficulty falling or staying asleep. You have had trouble sleeping at night, and past sleep medications have not been effective. We discussed improving your sleep hygiene, such as avoiding phone use in bed and reading a Kindle or book instead. Good sleep habits can  help improve your sleep quality.  INSTRUCTIONS: Please follow up with Dr. Luke to discuss starting IVIG therapy. Monitor your respiratory symptoms and seek medical attention if they worsen. Practice good sleep hygiene as discussed.                      Contains text generated by [employer].                                 Contains text generated by [employer].

## 2024-04-30 NOTE — Telephone Encounter (Signed)
 Please call patient.   I sent a mychart message regarding this labs and about IgG replacement therapy.  Let us  know if he wants me to start the paperwork to start the PA for the IgG infusions. I received his note from pulmonology and they agree that we should start given his history.    Your pneumococcal titers look like you had 9 out of 23 improvement in your titers which is adequate. Keep track of infections and antibiotics use.    Your IgG was 494. If you are interested we can see if you would qualify for IgG replacement therapy. It's usually a subcutaneous OR IV infusion done every 1-4 weeks depending on which product your insurance covers. This may be a good idea as you still keep getting infections    Most common side effects include - headache, localized pain at infusion site and fatigue for 1-2 days. Rarely it can increase your risk for blood clots.

## 2024-04-30 NOTE — Progress Notes (Signed)
 Jordan [name]    [id number]    11-03-1945  Primary Care Physician:Morrison, Bernardino MATSU, MD Date of Appointment: 04/30/2024 Established Patient Visit  Chief complaint:   Chief Complaint  Patient presents with   Follow-up    Follow up     HPI: Jordan [name] is a 78 y.o. man, never smoker,  with history esophageal cancer s/p esophagectomy and gastric pull through with persistent reflux symptoms.   Interval Updates: Here for follow up.  Discussed the use of AI scribe software for clinical note transcription with the patient, who gave verbal consent to proceed.  History of Present Illness Jordan [name] is a 78 year old male with hypogammaglobulinemia who presents for follow-up after a CT scan and allergy blood work.  His breathing has been stable, with clear lungs for about four days. A recent CT scan was performed to evaluate lung changes contributing to recurrent infections.  He has hypogammaglobulinemia, with recent blood work showing an IgG level of 494, below the normal range of 600 to 1600. Despite pneumococcal vaccination, his titers showed nine out of twenty-three improvements, considered adequate but still low normal.  He underwent an esophagectomy with gastric pull-through, resulting in some residual reflux symptoms. He experiences coughing and has not been using inhalers for breathing issues.  He reports occasional skipped heartbeats. He experiences difficulty sleeping at night, having tried sleep medications without success.   I have reviewed the patient's family social and past medical history and updated as appropriate.   Past Medical History:  Diagnosis Date   Allergy    Arthritis    back, shoulder- both    Barrett's esophagus    Barrett's esophagus with dysplasia 07/24/2021   BPH (benign prostatic hyperplasia)    Chronic lower back pain    Herniated disc at L4   Chronic pain 07/24/2021   Chronic rhinitis    Chronic sinusitis 11/11/2018   ED (erectile  dysfunction) of organic origin    GERD (gastroesophageal reflux disease)    Barrett's Esophagus, treated /w esophagectomy - 1998, continues toi have reflux & uses Zantac on a regular basis    Hemoptysis 11/11/2018   History of wheezing 11/11/2018   Hypertension 10/20/21   Neuromuscular disorder (HCC) 2011   Tingling feet   Neuropathy 06/18/2016   Pneumonitis due to inhalation of food or vomitus (HCC) 07/30/2022   Thyroid  disease    Tinnitus    hearing loss in both ears    Urinary tract infectious disease 08/13/2022    Past Surgical History:  Procedure Laterality Date   CATARACT EXTRACTION Bilateral 2019   Dr. Lelon   ESOPHAGECTOMY     1998- at [facility], /w epidural & sedation & pain management /w continued  epidural    EYE SURGERY  2018   Cataracts   HERNIA REPAIR     LUMBAR LAMINECTOMY/DECOMPRESSION MICRODISCECTOMY  03/22/2012   Procedure: LUMBAR LAMINECTOMY/DECOMPRESSION MICRODISCECTOMY 1 LEVEL;  Surgeon: Alm GORMAN Molt, MD;  Location: [facility];  Service: Neurosurgery;  Laterality: Right;  right lumbar four-five extra-foraminal microdiscectomy   SPINE SURGERY  2013   Ruptured disc L4    Family History  Problem Relation Age of Onset   Hypertension Mother    Alcohol abuse Mother    Arthritis Mother    Cancer Mother    Hypertension Father    Cancer Father    Heart attack Father    Alcohol abuse Father    Stroke Father  Allergic rhinitis Neg Hx    Asthma Neg Hx    Eczema Neg Hx    Urticaria Neg Hx     Social History   Occupational History   Occupation: Art Gallery Manager    Comment: Retired  Tobacco Use   Smoking status: Never    Passive exposure: Never   Smokeless tobacco: Never   Tobacco comments:    Never smoked  Vaping Use   Vaping status: Never Used  Substance and Sexual Activity   Alcohol use: Not Currently   Drug use: No   Sexual activity: Yes    Birth control/protection: Surgical, None     Physical Exam: Blood pressure 127/69, pulse (!) 57,  temperature 97.6 F (36.4 C), temperature source Oral, height 5' 6 (1.676 m), weight 169 lb 6.4 oz (76.8 kg), SpO2 95%.  Gen:      No acute distress ENT:  no nasal polyps, mucus membranes moist Lungs:    No increased respiratory effort, symmetric chest wall excursion, clear to auscultation bilaterally, no wheezes or crackles CV:         Regular rate and rhythm; no murmurs, rubs, or gallops.  No pedal edema   Data Reviewed: Imaging: I have personally reviewed the ct chest July 2025 shows peribronchial thickening, ground glass in RUL, no frank fibrosis.   PFTs:     Latest Ref Rng & Units 01/22/2024   10:35 AM  PFT Results  FVC-Pre L 3.83   FVC-Predicted Pre % 107   FVC-Post L 3.86   FVC-Predicted Post % 107   Pre FEV1/FVC % % 66   Post FEV1/FCV % % 68   FEV1-Pre L 2.54   FEV1-Predicted Pre % 99   FEV1-Post L 2.63   DLCO uncorrected ml/min/mmHg 19.43   DLCO UNC% % 87   DLVA Predicted % 79   TLC L 7.21   TLC % Predicted % 113   RV % Predicted % 144    I have personally reviewed the patient's PFTs and mild airflow limitation with air trapping consistent with patient's suspected asthma  Labs: Lab Results  Component Value Date   WBC 7.9 03/25/2024   HGB 12.1 (L) 03/25/2024   HCT 37.2 (L) 03/25/2024   MCV 88.8 03/25/2024   PLT 205 03/25/2024   Lab Results  Component Value Date   NA 143 03/25/2024   K 4.4 03/25/2024   CO2 23 03/25/2024   GLUCOSE 103 (H) 03/25/2024   BUN 23 03/25/2024   CREATININE 1.07 03/25/2024   CALCIUM 9.3 03/25/2024   GFR 68.77 04/18/2023   GFRNONAA >60 03/25/2024    Immunization status: Immunization History  Administered Date(s) Administered   INFLUENZA, HIGH DOSE SEASONAL PF 04/12/2015   Influenza,inj,Quad PF,6-35 Mos 06/28/2011, 03/12/2014   PNEUMOCOCCAL CONJUGATE-20 02/27/2024   Pneumococcal Conjugate-13 05/24/2017   Pneumococcal Polysaccharide-23 12/29/2012   Tdap 05/24/2017   Zoster, Live 06/28/2011    External Records Personally  Reviewed: pulmonary pcp  Assessment and Plan Assessment & Plan Hypogammaglobulinemia with recurrent respiratory infections IgG level at 494, below normal. Low pneumococcal titers despite vaccination. Frequent infections suggest IVIG therapy benefit.  Explained potential IVIG side effects.  - Discuss with Dr. Luke about starting IVIG therapy. - Monitor response to IVIG over six months.  Gastroesophageal reflux following esophagectomy with gastric pull-through and aspiration-related lung changes Ground glass nodules in lung bases due to aspiration post-esophagectomy. No cancer. Coughing likely reflux-related. - No follow-up needed for nodules.   Insomnia Chronic sleep onset difficulty, possibly age-related. Ineffective  past sleep medications. Poor sleep hygiene noted. - Advised against phone use in bed; recommended Kindle or book. - Encouraged good sleep hygiene, avoid screens in bedroom.    Return to Care: Return in about 6 months (around 10/28/2024) for Dr. Zaida.   Verdon Gore, MD Pulmonary and Critical Care Medicine [facility] Office:[phone]

## 2024-05-03 NOTE — Telephone Encounter (Signed)
 Jordan [name],  Can you start PA for Hyqvia 30g every 4 week infusion for hypogam?  IgG 494, history of frequent respiratory infections.   Thank you.

## 2024-05-04 ENCOUNTER — Encounter: Payer: Self-pay | Admitting: Allergy

## 2024-05-04 DIAGNOSIS — N401 Enlarged prostate with lower urinary tract symptoms: Secondary | ICD-10-CM | POA: Diagnosis not present

## 2024-05-06 ENCOUNTER — Telehealth: Payer: Self-pay | Admitting: *Deleted

## 2024-05-06 NOTE — Telephone Encounter (Signed)
 Call to patient regarding Hyqvia and submit to [facility]

## 2024-05-06 NOTE — Telephone Encounter (Signed)
 Called patient and discussed Hyqvia infusions and orders sent to [facility] to service patient

## 2024-05-11 ENCOUNTER — Encounter: Payer: Self-pay | Admitting: Allergy

## 2024-05-11 DIAGNOSIS — N401 Enlarged prostate with lower urinary tract symptoms: Secondary | ICD-10-CM | POA: Diagnosis not present

## 2024-05-11 DIAGNOSIS — R972 Elevated prostate specific antigen [PSA]: Secondary | ICD-10-CM | POA: Diagnosis not present

## 2024-05-12 ENCOUNTER — Other Ambulatory Visit: Payer: Self-pay | Admitting: Urology

## 2024-05-12 DIAGNOSIS — R972 Elevated prostate specific antigen [PSA]: Secondary | ICD-10-CM

## 2024-05-18 ENCOUNTER — Encounter: Payer: Self-pay | Admitting: Allergy

## 2024-05-18 NOTE — Telephone Encounter (Signed)
 Please start Gamunex C SQ at 10g once a week injections.

## 2024-05-18 NOTE — Telephone Encounter (Signed)
 Per [facility] and patient Ins patient has to try and fail preferred Gamunex C I also tried to get auth for Hyqvia which was denied for same reason

## 2024-05-18 NOTE — Telephone Encounter (Signed)
 They will cover the subcutaneous or IV version

## 2024-05-18 NOTE — Telephone Encounter (Signed)
 Tammy,  Will they cover the SQ or IV version as the dosing is slightly different.

## 2024-05-20 ENCOUNTER — Ambulatory Visit: Admitting: Internal Medicine

## 2024-05-22 ENCOUNTER — Encounter: Payer: Self-pay | Admitting: Internal Medicine

## 2024-06-02 ENCOUNTER — Inpatient Hospital Stay: Admission: RE | Admit: 2024-06-02 | Discharge: 2024-06-02 | Attending: Urology

## 2024-06-02 DIAGNOSIS — R972 Elevated prostate specific antigen [PSA]: Secondary | ICD-10-CM

## 2024-06-02 MED ORDER — GADOPICLENOL 0.5 MMOL/ML IV SOLN
10.0000 mL | Freq: Once | INTRAVENOUS | Status: AC | PRN
  Administered 2024-06-02: 17:00:00 8 mL via INTRAVENOUS

## 2024-06-10 ENCOUNTER — Encounter: Payer: Self-pay | Admitting: Internal Medicine

## 2024-06-29 ENCOUNTER — Encounter: Payer: Self-pay | Admitting: Emergency Medicine

## 2024-06-29 ENCOUNTER — Ambulatory Visit: Admitting: Emergency Medicine

## 2024-06-29 ENCOUNTER — Ambulatory Visit: Payer: Self-pay

## 2024-06-29 VITALS — BP 134/70 | HR 64 | Temp 97.9°F | Ht 66.0 in | Wt 172.2 lb

## 2024-06-29 DIAGNOSIS — R062 Wheezing: Secondary | ICD-10-CM

## 2024-06-29 DIAGNOSIS — J22 Unspecified acute lower respiratory infection: Secondary | ICD-10-CM

## 2024-06-29 MED ORDER — PREDNISONE 20 MG PO TABS: 40.0000 mg | ORAL_TABLET | Freq: Every day | ORAL | 0 refills | Status: AC

## 2024-06-29 MED ORDER — AZITHROMYCIN 250 MG PO TABS: ORAL_TABLET | ORAL | 0 refills | Status: AC

## 2024-06-29 NOTE — Progress Notes (Signed)
 Jordan [name] Mon 79 y.o.   Chief Complaint  Patient presents with   Cough    Pt stated he has been coughing and wheezing for 5 days, have tried over the counter meds but it has not helped     HISTORY OF PRESENT ILLNESS: This is a 79 y.o. male with with history of GERD complaining of cough and wheezing for 5 days Has history of aspiration pneumonitis/bronchitis.  Feels like another episode again. States he responds well to prednisone  and erythromycin. Denies chest pain or difficulty breathing.  Denies fever or chills. No other associated symptoms No other complaints or medical concerns today.  Cough Associated symptoms include heartburn. Pertinent negatives include no chest pain, chills, fever, headaches, rash or sore throat.     Prior to Admission medications  Medication Sig Start Date End Date Taking? Authorizing Provider  albuterol  (VENTOLIN  HFA) 108 (90 Base) MCG/ACT inhaler Inhale 2 puffs into the lungs every 6 (six) hours as needed for wheezing or shortness of breath. 01/18/23  Yes Jordan Bernardino MATSU, MD  amLODipine  (NORVASC ) 5 MG tablet Take 5 mg by mouth daily. 04/08/24  Yes [provider]  Famotidine-Ca Carb-Mag Hydrox (PEPCID COMPLETE PO) Take by mouth daily.   Yes [provider]  fluticasone -salmeterol (ADVAIR HFA) 230-21 MCG/ACT inhaler Inhale 2 puffs into the lungs 2 (two) times daily. 02/05/24  Yes Kasa, Kurian, MD  HYDROcodone -acetaminophen  (NORCO/VICODIN) 5-325 MG tablet Take 1 tablet by mouth every 6 (six) hours as needed for moderate pain (pain score 4-6) or severe pain (pain score 7-10). 03/31/24  Yes Jordan Bernardino MATSU, MD  ibuprofen (ADVIL) 200 MG tablet Take 200 mg by mouth 4 (four) times daily.   Yes [provider]  ipratropium (ATROVENT ) 0.06 % nasal spray Place 1-2 sprays into both nostrils 3 (three) times daily as needed (runny nose/drainage). 04/23/24  Yes Jordan Orlan HERO, DO  ipratropium-albuterol  (DUONEB) 0.5-2.5 (3) MG/3ML SOLN Take 3 mLs by  nebulization every 6 (six) hours as needed. 06/24/23  Yes Jordan Lukes, PA  mometasone  (NASONEX ) 50 MCG/ACT nasal spray Place 1-2 sprays into the nose daily. For nasal congestion 01/21/24  Yes Jordan Orlan HERO, DO  pantoprazole  (PROTONIX ) 40 MG tablet Take 1 tablet (40 mg total) by mouth daily. 09/17/23  Yes Jordan Bernardino MATSU, MD  sildenafil (VIAGRA) 100 MG tablet    Yes [provider]  sodium chloride  0.9 % nebulizer solution Take 3 mLs by nebulization as needed for wheezing. 06/27/23  Yes Jordan Bernardino MATSU, MD  triamcinolone  (NASACORT  ALLERGY 24HR) 55 MCG/ACT AERO nasal inhaler Place 1-2 sprays into the nose daily as needed (nasal congestion). 04/23/24  Yes Jordan Orlan HERO, DO    Allergies[1]  Patient Active Problem List   Diagnosis Date Noted   Gastroesophageal reflux disease with esophagitis 03/25/2024   Suspected exposure hazardous substances 01/18/2024   Occupational exposure to chemical pollution 01/18/2024   Chronic anemia 04/19/2023   Arthritis of carpometacarpal [facility]) joint of left thumb 04/15/2023   Hiatal hernia 09/11/2022   Peripheral polyneuropathy 08/13/2022   Dysphagia 07/30/2022   Dizziness 07/30/2022   Pneumonitis due to inhalation of regurgitated food (HCC) 07/30/2022   Encounter for screening for malignant neoplasm of colon 07/30/2022   Stricture of esophagus 07/30/2022   H/O esophagectomy 07/30/2022   Mechanical complication of esophagostomy (HCC) 07/30/2022   Insomnia 07/30/2022   PSA elevation 07/30/2022   BPH (benign prostatic hyperplasia) 07/30/2022   Restless legs syndrome 07/30/2022   Hyperlipidemia, acquired 07/30/2022   Hypertension  12/11/2021   Displacement of lumbar intervertebral disc without myelopathy 07/24/2021   Hypertensive retinopathy 07/24/2021   Lower urinary tract symptoms due to benign prostatic hyperplasia 07/24/2021   Mixed hyperlipidemia 07/24/2021   Chronic bronchitis (HCC) 07/24/2021   GERD (gastroesophageal reflux disease) 11/11/2018    Back pain at L4-L5 level 07/23/2016   Allergies 06/18/2009    Past Medical History:  Diagnosis Date   Allergy    Arthritis    back, shoulder- both    Barrett's esophagus    Barrett's esophagus with dysplasia 07/24/2021   BPH (benign prostatic hyperplasia)    Chronic lower back pain    Herniated disc at L4   Chronic pain 07/24/2021   Chronic rhinitis    Chronic sinusitis 11/11/2018   ED (erectile dysfunction) of organic origin    GERD (gastroesophageal reflux disease)    Barrett's Esophagus, treated /w esophagectomy - 1998, continues toi have reflux & uses Zantac on a regular basis    Hemoptysis 11/11/2018   History of wheezing 11/11/2018   Hypertension 10/20/21   Neuromuscular disorder (HCC) 2011   Tingling feet   Neuropathy 06/18/2016   Pneumonitis due to inhalation of food or vomitus (HCC) 07/30/2022   Thyroid  disease    Tinnitus    hearing loss in both ears    Urinary tract infectious disease 08/13/2022    Past Surgical History:  Procedure Laterality Date   CATARACT EXTRACTION Bilateral 2019   Dr. Lelon   ESOPHAGECTOMY     1998- at [facility], /w epidural & sedation & pain management /w continued  epidural    EYE SURGERY  2018   Cataracts   HERNIA REPAIR     LUMBAR LAMINECTOMY/DECOMPRESSION MICRODISCECTOMY  03/22/2012   Procedure: LUMBAR LAMINECTOMY/DECOMPRESSION MICRODISCECTOMY 1 LEVEL;  Surgeon: Jordan GORMAN Molt, MD;  Location: [facility];  Service: Neurosurgery;  Laterality: Right;  right lumbar four-five extra-foraminal microdiscectomy   SPINE SURGERY  2013   Ruptured disc L4    Social History   Socioeconomic History   Marital status: Married    Spouse name: Jordan [name]   Number of children: 4   Years of education: Not on file   Highest education level: Bachelor's degree (e.g., BA, AB, BS)  Occupational History   Occupation: Art Gallery Manager    Comment: Retired  Tobacco Use   Smoking status: Never    Passive exposure: Never   Smokeless tobacco: Never   Tobacco  comments:    Never smoked  Vaping Use   Vaping status: Never Used  Substance and Sexual Activity   Alcohol use: Not Currently   Drug use: No   Sexual activity: Yes    Birth control/protection: Surgical, None  Other Topics Concern   Not on file  Social History Narrative   Married father of 4: 3 girls and 1 boy.   Wife's name is Hospital Doctor-   Retired art gallery manager      No routine exercise.   1-2 cups coffee a day.   2-3 beers a week   Social Drivers of Health   Tobacco Use: Low Risk (04/30/2024)   Patient History    Smoking Tobacco Use: Never    Smokeless Tobacco Use: Never    Passive Exposure: Never  Financial Resource Strain: Low Risk (10/15/2023)   Overall Financial Resource Strain (CARDIA)    Difficulty of Paying Living Expenses: Not hard at all  Food Insecurity: No Food Insecurity (10/15/2023)   Hunger Vital Sign    Worried About Running  Out of Food in the Last Year: Never true    Ran Out of Food in the Last Year: Never true  Transportation Needs: No Transportation Needs (10/15/2023)   PRAPARE - Administrator, Civil Service (Medical): No    Lack of Transportation (Non-Medical): No  Physical Activity: Sufficiently Active (10/15/2023)   Exercise Vital Sign    Days of Exercise per Week: 3 days    Minutes of Exercise per Session: 150+ min  Stress: No Stress Concern Present (10/15/2023)   [employer] of Occupational Health - Occupational Stress Questionnaire    Feeling of Stress : Not at all  Social Connections: Moderately Integrated (10/15/2023)   Social Connection and Isolation Panel    Frequency of Communication with Friends and Family: Three times a week    Frequency of Social Gatherings with Friends and Family: More than three times a week    Attends Religious Services: More than 4 times per year    Active Member of Clubs or Organizations: No    Attends Banker Meetings: Never    Marital Status: Married  Catering Manager  Violence: Not At Risk (10/15/2023)   Humiliation, Afraid, Rape, and Kick questionnaire    Fear of Current or Ex-Partner: No    Emotionally Abused: No    Physically Abused: No    Sexually Abused: No  Depression (PHQ2-9): Low Risk (10/15/2023)   Depression (PHQ2-9)    PHQ-2 Score: 0  Alcohol Screen: Low Risk (10/15/2023)   Alcohol Screen    Last Alcohol Screening Score (AUDIT): 0  Housing: Unknown (10/15/2023)   Housing Stability Vital Sign    Unable to Pay for Housing in the Last Year: No    Number of Times Moved in the Last Year: Not on file    Homeless in the Last Year: No  Utilities: Not At Risk (10/15/2023)   AHC Utilities    Threatened with loss of utilities: No  Health Literacy: Adequate Health Literacy (10/15/2023)   B1300 Health Literacy    Frequency of need for help with medical instructions: Never    Family History  Problem Relation Age of Onset   Hypertension Mother    Alcohol abuse Mother    Arthritis Mother    Cancer Mother    Hypertension Father    Cancer Father    Heart attack Father    Alcohol abuse Father    Stroke Father    Allergic rhinitis Neg Hx    Asthma Neg Hx    Eczema Neg Hx    Urticaria Neg Hx      Review of Systems  Constitutional: Negative.  Negative for chills and fever.  HENT: Negative.  Negative for congestion and sore throat.   Respiratory:  Positive for cough and sputum production.   Cardiovascular: Negative.  Negative for chest pain and palpitations.  Gastrointestinal:  Positive for heartburn. Negative for abdominal pain, diarrhea, nausea and vomiting.  Genitourinary: Negative.  Negative for dysuria and hematuria.  Skin: Negative.  Negative for rash.  Neurological: Negative.  Negative for dizziness and headaches.  All other systems reviewed and are negative.   Vitals:   06/29/24 1444  BP: 134/70  Pulse: 64  Temp: 97.9 F (36.6 C)  SpO2: 97%    Physical Exam Vitals reviewed.  Constitutional:      Appearance: Normal  appearance.  HENT:     Head: Normocephalic.  Eyes:     Extraocular Movements: Extraocular movements intact.     Pupils:  Pupils are equal, round, and reactive to light.  Cardiovascular:     Rate and Rhythm: Normal rate and regular rhythm.     Pulses: Normal pulses.     Heart sounds: Normal heart sounds.  Pulmonary:     Effort: No respiratory distress.     Breath sounds: Wheezing and rhonchi present. No rales.  Musculoskeletal:     Cervical back: No tenderness.  Lymphadenopathy:     Cervical: No cervical adenopathy.  Skin:    General: Skin is warm and dry.     Capillary Refill: Capillary refill takes less than 2 seconds.  Neurological:     General: No focal deficit present.     Mental Status: He is alert and oriented to person, place, and time.  Psychiatric:        Mood and Affect: Mood normal.        Behavior: Behavior normal.      ASSESSMENT & PLAN: Problem List Items Addressed This Visit       Respiratory   Lower respiratory infection - Primary   Clinically stable.  No signs of pneumonia No red flag signs or symptoms Symptom management discussed Recommend daily azithromycin  for 5 days Advised to rest and stay well-hydrated ED precautions given Advised to contact the office if no better or worse during the next several days.      Relevant Medications   azithromycin  (ZITHROMAX ) 250 MG tablet     Other   History of wheezing   Symptom management discussed Continue inhalers and nebulizers at home Start prednisone  40 mg daily for 5 days      Relevant Medications   predniSONE  (DELTASONE ) 20 MG tablet   Patient Instructions  Acute Bronchitis, Adult  Acute bronchitis is when air tubes in the lungs (bronchi) suddenly get swollen. The condition can make it hard for you to breathe. In adults, acute bronchitis usually goes away within 2 weeks. A cough caused by bronchitis may last up to 3 weeks. Smoking, allergies, and asthma can make the condition worse. What are  the causes? Germs that cause cold and flu (viruses). The most common cause of this condition is the virus that causes the common cold. Bacteria. Substances that bother (irritate) the lungs, including: Smoke from cigarettes and other types of tobacco. Dust and pollen. Fumes from chemicals, gases, or burned fuel. Indoor or outdoor air pollution. What increases the risk? A weak body's defense system. This is also called the immune system. Any condition that affects your lungs and breathing, such as asthma. What are the signs or symptoms? A cough. Coughing up clear, yellow, or green mucus. Making high-pitched whistling sounds when you breathe, most often when you breathe out (wheezing). Runny or stuffy nose. Having too much mucus in your lungs (chest congestion). Shortness of breath. Body aches. A sore throat. How is this treated? Acute bronchitis may go away over time without treatment. Your doctor may tell you to: Drink more fluids. This will help thin your mucus so it is easier to cough up. Use a device that gets medicine into your lungs (inhaler). Use a vaporizer or a humidifier. These are machines that add water to the air. This helps with coughing and poor breathing. Take a medicine that thins mucus and helps clear it from your lungs. Take a medicine that prevents or stops coughing. It is not common to take an antibiotic medicine for this condition. Follow these instructions at home:  Take over-the-counter and prescription medicines only as told by  your doctor. Use an inhaler, vaporizer, or humidifier as told by your doctor. Take two teaspoons (10 mL) of honey at bedtime. This helps lessen your coughing at night. Drink enough fluid to keep your pee (urine) pale yellow. Do not smoke or use any products that contain nicotine or tobacco. If you need help quitting, ask your doctor. Get a lot of rest. Return to your normal activities when your doctor says that it is safe. Keep all  follow-up visits. How is this prevented?  Wash your hands often with soap and water for at least 20 seconds. If you cannot use soap and water, use hand sanitizer. Avoid contact with people who have cold symptoms. Try not to touch your mouth, nose, or eyes with your hands. Avoid breathing in smoke or chemical fumes. Make sure to get the flu shot every year. Contact a doctor if: Your symptoms do not get better in 2 weeks. You have trouble coughing up the mucus. Your cough keeps you awake at night. You have a fever. Get help right away if: You cough up blood. You have chest pain. You have very bad shortness of breath. You faint or keep feeling like you are going to faint. You have a very bad headache. Your fever or chills get worse. These symptoms may be an emergency. Get help right away. Call your local emergency services (911 in the [place] Do not wait to see if the symptoms will go away. Do not drive yourself to the hospital. Summary Acute bronchitis is when air tubes in the lungs (bronchi) suddenly get swollen. In adults, acute bronchitis usually goes away within 2 weeks. Drink more fluids. This will help thin your mucus so it is easier to cough up. Take over-the-counter and prescription medicines only as told by your doctor. Contact a doctor if your symptoms do not improve after 2 weeks of treatment. This information is not intended to replace advice given to you by your health care provider. Make sure you discuss any questions you have with your health care provider. Document Revised: 10/05/2020 Document Reviewed: 10/05/2020 Elsevier Patient Education  2024 Elsevier Inc.    Emil Schaumann, MD [facility]    [1]  Allergies Allergen Reactions   Latex    Adhesive [Tape] Rash

## 2024-06-29 NOTE — Patient Instructions (Signed)
 Acute Bronchitis, Adult  Acute bronchitis is when air tubes in the lungs (bronchi) suddenly get swollen. The condition can make it hard for you to breathe. In adults, acute bronchitis usually goes away within 2 weeks. A cough caused by bronchitis may last up to 3 weeks. Smoking, allergies, and asthma can make the condition worse. What are the causes? Germs that cause cold and flu (viruses). The most common cause of this condition is the virus that causes the common cold. Bacteria. Substances that bother (irritate) the lungs, including: Smoke from cigarettes and other types of tobacco. Dust and pollen. Fumes from chemicals, gases, or burned fuel. Indoor or outdoor air pollution. What increases the risk? A weak body's defense system. This is also called the immune system. Any condition that affects your lungs and breathing, such as asthma. What are the signs or symptoms? A cough. Coughing up clear, yellow, or green mucus. Making high-pitched whistling sounds when you breathe, most often when you breathe out (wheezing). Runny or stuffy nose. Having too much mucus in your lungs (chest congestion). Shortness of breath. Body aches. A sore throat. How is this treated? Acute bronchitis may go away over time without treatment. Your doctor may tell you to: Drink more fluids. This will help thin your mucus so it is easier to cough up. Use a device that gets medicine into your lungs (inhaler). Use a vaporizer or a humidifier. These are machines that add water to the air. This helps with coughing and poor breathing. Take a medicine that thins mucus and helps clear it from your lungs. Take a medicine that prevents or stops coughing. It is not common to take an antibiotic medicine for this condition. Follow these instructions at home:  Take over-the-counter and prescription medicines only as told by your doctor. Use an inhaler, vaporizer, or humidifier as told by your doctor. Take two teaspoons  (10 mL) of honey at bedtime. This helps lessen your coughing at night. Drink enough fluid to keep your pee (urine) pale yellow. Do not smoke or use any products that contain nicotine or tobacco. If you need help quitting, ask your doctor. Get a lot of rest. Return to your normal activities when your doctor says that it is safe. Keep all follow-up visits. How is this prevented?  Wash your hands often with soap and water for at least 20 seconds. If you cannot use soap and water, use hand sanitizer. Avoid contact with people who have cold symptoms. Try not to touch your mouth, nose, or eyes with your hands. Avoid breathing in smoke or chemical fumes. Make sure to get the flu shot every year. Contact a doctor if: Your symptoms do not get better in 2 weeks. You have trouble coughing up the mucus. Your cough keeps you awake at night. You have a fever. Get help right away if: You cough up blood. You have chest pain. You have very bad shortness of breath. You faint or keep feeling like you are going to faint. You have a very bad headache. Your fever or chills get worse. These symptoms may be an emergency. Get help right away. Call your local emergency services (911 in the [place] Do not wait to see if the symptoms will go away. Do not drive yourself to the hospital. Summary Acute bronchitis is when air tubes in the lungs (bronchi) suddenly get swollen. In adults, acute bronchitis usually goes away within 2 weeks. Drink more fluids. This will help thin your mucus so it is easier  to cough up. Take over-the-counter and prescription medicines only as told by your doctor. Contact a doctor if your symptoms do not improve after 2 weeks of treatment. This information is not intended to replace advice given to you by your health care provider. Make sure you discuss any questions you have with your health care provider. Document Revised: 10/05/2020 Document Reviewed: 10/05/2020 Elsevier Patient  Education  2024 [employer].

## 2024-06-29 NOTE — Telephone Encounter (Signed)
 FYI Only or Action Required?: Action required by provider: request for appointment.  Patient was last seen in primary care on 04/07/2024 by Lucius Krabbe, NP.  Called Nurse Triage reporting Cough.  Symptoms began several days ago.  Interventions attempted: OTC medications: Corcidin.  Symptoms are: gradually worsening. Productive cough with yellow mucus. Wheezing. Using nebulizer.  Triage Disposition: See HCP Within 4 Hours (Or PCP Triage)  Patient/caregiver understands and will follow disposition?: Yes      Copied from CRM #[id number]. Topic: Clinical - Red Word Triage >> Jun 29, 2024  8:31 AM Rea ORN wrote: Red Word that prompted transfer to Nurse Triage: Pt stated he has chronic bronchitis. Current sx: productive wet coughing with yellow mucus for the past 4 days. Pt said the mucus is changing color to clear but was yellow when it began. Reason for Disposition  [1] MILD difficulty breathing (e.g., minimal/no SOB at rest, SOB with walking, pulse < 100) AND [2] still present when not coughing  Answer Assessment - Initial Assessment Questions 1. ONSET: When did the cough begin?      5 days 2. SEVERITY: How bad is the cough today?      severe 3. SPUTUM: Describe the color of your sputum (e.g., none, dry cough; clear, white, yellow, green)     yellow 4. HEMOPTYSIS: Are you coughing up any blood? If Yes, ask: How much? (e.g., flecks, streaks, tablespoons, etc.)     no 5. DIFFICULTY BREATHING: Are you having difficulty breathing? If Yes, ask: How bad is it? (e.g., mild, moderate, severe)      yes 6. FEVER: Do you have a fever? If Yes, ask: What is your temperature, how was it measured, and when did it start?     no 7. CARDIAC HISTORY: Do you have any history of heart disease? (e.g., heart attack, congestive heart failure)      HTN 8. LUNG HISTORY: Do you have any history of lung disease?  (e.g., pulmonary embolus, asthma, emphysema)     bronchitis 9. PE  RISK FACTORS: Do you have a history of blood clots? (or: recent major surgery, recent prolonged travel, bedridden)     NO 10. OTHER SYMPTOMS: Do you have any other symptoms? (e.g., runny nose, wheezing, chest pain)       WHEEZING 11. PREGNANCY: Is there any chance you are pregnant? When was your last menstrual period?       N/a 12. TRAVEL: Have you traveled out of the country in the last month? (e.g., travel history, exposures)       no  Protocols used: Cough - Acute Productive-A-AH

## 2024-06-29 NOTE — Assessment & Plan Note (Signed)
 Symptom management discussed Continue inhalers and nebulizers at home Start prednisone  40 mg daily for 5 days

## 2024-06-29 NOTE — Assessment & Plan Note (Signed)
 Clinically stable.  No signs of pneumonia No red flag signs or symptoms Symptom management discussed Recommend daily azithromycin  for 5 days Advised to rest and stay well-hydrated ED precautions given Advised to contact the office if no better or worse during the next several days.

## 2024-07-04 ENCOUNTER — Other Ambulatory Visit: Payer: Self-pay | Admitting: Internal Medicine

## 2024-07-04 DIAGNOSIS — J441 Chronic obstructive pulmonary disease with (acute) exacerbation: Secondary | ICD-10-CM

## 2024-07-04 DIAGNOSIS — R059 Cough, unspecified: Secondary | ICD-10-CM

## 2024-07-04 DIAGNOSIS — J418 Mixed simple and mucopurulent chronic bronchitis: Secondary | ICD-10-CM

## 2024-07-16 ENCOUNTER — Ambulatory Visit: Admitting: Internal Medicine

## 2024-07-16 ENCOUNTER — Encounter: Payer: Self-pay | Admitting: Internal Medicine

## 2024-07-16 ENCOUNTER — Ambulatory Visit

## 2024-07-16 VITALS — BP 110/60 | HR 87 | Temp 98.0°F | Ht 66.0 in | Wt 170.6 lb

## 2024-07-16 DIAGNOSIS — K449 Diaphragmatic hernia without obstruction or gangrene: Secondary | ICD-10-CM

## 2024-07-16 DIAGNOSIS — J180 Bronchopneumonia, unspecified organism: Secondary | ICD-10-CM | POA: Diagnosis not present

## 2024-07-16 DIAGNOSIS — K2100 Gastro-esophageal reflux disease with esophagitis, without bleeding: Secondary | ICD-10-CM

## 2024-07-16 DIAGNOSIS — K219 Gastro-esophageal reflux disease without esophagitis: Secondary | ICD-10-CM | POA: Diagnosis not present

## 2024-07-16 DIAGNOSIS — N402 Nodular prostate without lower urinary tract symptoms: Secondary | ICD-10-CM | POA: Diagnosis not present

## 2024-07-16 MED ORDER — AMOXICILLIN-POT CLAVULANATE 875-125 MG PO TABS: 1.0000 | ORAL_TABLET | Freq: Two times a day (BID) | ORAL | 0 refills | Status: AC

## 2024-07-16 MED ORDER — VOQUEZNA DUAL PAK 500-20 MG PO THPK: 1.0000 | PACK | Freq: Every day | ORAL | 4 refills | Status: DC

## 2024-07-16 MED ORDER — PREDNISONE 20 MG PO TABS: ORAL_TABLET | ORAL | 0 refills | Status: AC

## 2024-07-16 MED ORDER — VOQUEZNA 20 MG PO TABS: 1.0000 | ORAL_TABLET | Freq: Every day | ORAL | 4 refills | Status: AC

## 2024-07-16 NOTE — Assessment & Plan Note (Signed)
 Gastroesophageal reflux disease post-esophagectomy with Barrett's esophagus and hiatal hernia   Chronic GERD persists despite current management with pantoprazole  and Pepcid Complete. Reflux events contribute to bronchopneumonia. Discussed potential surgical intervention to tighten the esophageal sphincter. Prescribed a new reflux medication, considering cost. Referred to thoracic surgery for evaluation of potential surgical intervention to reduce reflux. Advised to avoid spicy foods and maintain an elevated sleeping position.

## 2024-07-16 NOTE — Progress Notes (Signed)
 ==============================  [facility]: [phone]   -- Medical Office Visit --  Patient: Jordan [name]      Age: 79 y.o.       Sex:  male  Date:   07/16/2024 Today's Healthcare Provider: Bernardino KANDICE Cone, MD  ==============================   Chief Complaint: Cough (//////////Has been sick for 5 days chest congestion seems to have bronchitis again pt states feels like no fevers just no energy at all.//)  Discussed the use of AI scribe software for clinical note transcription with the patient, who gave verbal consent to proceed.  History of Present Illness 79 year old male with recurrent bronchitis and esophageal issues who presents with worsening bronchitis symptoms.  He has experienced worsening bronchitis symptoms over the past three days, characterized by a persistent cough and increased mucus production. He reports that his symptoms worsen after eating certain foods, particularly tacos, and believes his cough and mucus production are related to his acid reflux. He has been trying to avoid these triggers.  He has a history of Barrett's esophagus with high-grade dysplasia, for which he underwent an esophagectomy over thirty years ago. He reports that his acid reflux has worsened over the years since his esophagectomy. He manages his reflux by sleeping in an elevated position and avoiding certain foods. He is currently taking pantoprazole  and Pepcid Complete for acid reflux.  He has been experiencing recurrent pneumonia and bronchitis, with frequent antibiotic and steroid treatments. He uses an inhaler and nebulizer regularly and has had multiple courses of antibiotics in the past few months, with one course each month recently. He reports that his lung symptoms have been worsening and that he usually sleeps on his right side. He has had pneumonia shots in the past but is unsure if he has received the latest vaccines.  He is scheduled for a  prostate biopsy on February 5th due to an elevated PSA level of 10.5 and an MRI score of 3. He has a family history of prostate cancer, as his father had the condition.  He is concerned about the cost of medications and mentions using GoodRx and other discount programs to manage expenses.  Background Reviewed: Problem List: has GERD (gastroesophageal reflux disease); History of wheezing; Hypertension; Allergies; Back pain at L4-L5 level; Displacement of lumbar intervertebral disc without myelopathy; Dysphagia; Dizziness; Hypertensive retinopathy; Lower urinary tract symptoms due to benign prostatic hyperplasia; Mixed hyperlipidemia; Pneumonitis due to inhalation of regurgitated food (HCC); Encounter for screening for malignant neoplasm of colon; Stricture of esophagus; Chronic bronchitis (HCC); H/O esophagectomy; Mechanical complication of esophagostomy (HCC); Insomnia; PSA elevation; BPH (benign prostatic hyperplasia); Restless legs syndrome; Hyperlipidemia, acquired; Peripheral polyneuropathy; Hiatal hernia; Arthritis of carpometacarpal (CMC) joint of left thumb; Chronic anemia; Suspected exposure hazardous substances; Occupational exposure to chemical pollution; Gastroesophageal reflux disease with esophagitis; and Lower respiratory infection on their problem list. Past Medical History:  has a past medical history of Allergy, Arthritis, Barrett's esophagus, Barrett's esophagus with dysplasia (07/24/2021), BPH (benign prostatic hyperplasia), Chronic lower back pain, Chronic pain (07/24/2021), Chronic rhinitis, Chronic sinusitis (11/11/2018), ED (erectile dysfunction) of organic origin, GERD (gastroesophageal reflux disease), Hemoptysis (11/11/2018), History of wheezing (11/11/2018), Hypertension (10/20/21), Neuromuscular disorder (HCC) (2011), Neuropathy (06/18/2016), Pneumonitis due to inhalation of food or vomitus (HCC) (07/30/2022), Thyroid  disease, Tinnitus, and Urinary tract infectious disease  (08/13/2022). Past Surgical History:   has a past surgical history that includes Esophagectomy; Lumbar laminectomy/decompression microdiscectomy (03/22/2012); Hernia repair; Cataract extraction (Bilateral, 2019); Eye surgery (2018); and  Spine surgery (2013). Social History:   reports that he has never smoked. He has never been exposed to tobacco smoke. He has never used smokeless tobacco. He reports that he does not currently use alcohol. He reports that he does not use drugs. Family History:  family history includes Alcohol abuse in his father and mother; Arthritis in his mother; Cancer in his father and mother; Heart attack in his father; Hypertension in his father and mother; Stroke in his father. Allergies:  is allergic to latex and adhesive [tape].   Medication Reconciliation: Current Outpatient Medications on File Prior to Visit  Medication Sig   telmisartan  (MICARDIS ) 20 MG tablet Take 20 mg by mouth daily.   albuterol  (VENTOLIN  HFA) 108 (90 Base) MCG/ACT inhaler TAKE 2 PUFFS BY MOUTH EVERY 6 HOURS AS NEEDED FOR WHEEZE OR SHORTNESS OF BREATH   amLODipine  (NORVASC ) 5 MG tablet Take 5 mg by mouth daily.   Famotidine-Ca Carb-Mag Hydrox (PEPCID COMPLETE PO) Take by mouth daily.   fluticasone -salmeterol (ADVAIR HFA) 230-21 MCG/ACT inhaler Inhale 2 puffs into the lungs 2 (two) times daily.   HYDROcodone -acetaminophen  (NORCO/VICODIN) 5-325 MG tablet Take 1 tablet by mouth every 6 (six) hours as needed for moderate pain (pain score 4-6) or severe pain (pain score 7-10).   ibuprofen (ADVIL) 200 MG tablet Take 200 mg by mouth 4 (four) times daily.   ipratropium (ATROVENT ) 0.06 % nasal spray Place 1-2 sprays into both nostrils 3 (three) times daily as needed (runny nose/drainage).   ipratropium-albuterol  (DUONEB) 0.5-2.5 (3) MG/3ML SOLN Take 3 mLs by nebulization every 6 (six) hours as needed.   mometasone  (NASONEX ) 50 MCG/ACT nasal spray Place 1-2 sprays into the nose daily. For nasal congestion    sildenafil (VIAGRA) 100 MG tablet    sodium chloride  0.9 % nebulizer solution Take 3 mLs by nebulization as needed for wheezing.   triamcinolone  (NASACORT  ALLERGY 24HR) 55 MCG/ACT AERO nasal inhaler Place 1-2 sprays into the nose daily as needed (nasal congestion).   No current facility-administered medications on file prior to visit.   Medications Discontinued During This Encounter  Medication Reason   pantoprazole  (PROTONIX ) 40 MG tablet    Amoxicillin  & Vonoprazan (VOQUEZNA  DUAL PAK) 500 & 20 MG THPK      Physical Exam:    07/16/2024    9:39 AM 06/29/2024    2:44 PM 04/30/2024    9:23 AM  Vitals with BMI  Height 5' 6 5' 6 5' 6  Weight 170 lbs 10 oz 172 lbs 4 oz 169 lbs 6 oz  BMI 27.55 27.82 27.35  Systolic 110 134 872  Diastolic 60 70 69  Pulse 87 64 57  Vital signs reviewed.  Nursing notes reviewed. Weight trend reviewed. Physical Activity: Sufficiently Active (10/15/2023)   Exercise Vital Sign    Days of Exercise per Week: 3 days    Minutes of Exercise per Session: 150+ min   General Appearance:  No acute distress appreciable.   Well-groomed, healthy-appearing male.  Well proportioned with no abnormal fat distribution.  Good muscle tone. Pulmonary:  Normal work of breathing at rest, no respiratory distress apparent. SpO2: 98 %  Musculoskeletal: All extremities are intact.  Neurological:  Awake, alert, oriented, and engaged.  No obvious focal neurological deficits or cognitive impairments.  Sensorium seems unclouded.   Speech is clear and coherent with logical content. Psychiatric:  Appropriate mood, pleasant and cooperative demeanor, thoughtful and engaged during the exam   Verbalized to patient: Physical Exam CHEST: Abnormal lung  sounds on the right side.  Results Radiology Prostate MRI: PI-RADS 3 lesion     10/15/2023   10:10 AM 06/27/2023    8:15 AM 04/15/2023   10:44 AM 02/19/2023    2:34 PM  PHQ 2/9 Scores  PHQ - 2 Score 0 0 0 0  PHQ- 9 Score  2         Data saved with a previous flowsheet row definition   Telephone on 04/20/2024  Component Date Value Ref Range Status   Pneumo Ab Type 1* 04/20/2024 0.7 (L)  >1.3 ug/mL Final   Pneumo Ab Type 3* 04/20/2024 0.1 (L)  >1.3 ug/mL Final   Pneumo Ab Type 4* 04/20/2024 6.4  >1.3 ug/mL Final   Pneumo Ab Type 8* 04/20/2024 14.0  >1.3 ug/mL Final   Pneumo Ab Type 9 (9N)* 04/20/2024 2.2  >1.3 ug/mL Final   Pneumo Ab Type 12 (40F)* 04/20/2024 0.3 (L)  >1.3 ug/mL Final   Pneumo Ab Type 14* 04/20/2024 1.4  >1.3 ug/mL Final   Pneumo Ab Type 17 (93F)* 04/20/2024 0.7 (L)  >1.3 ug/mL Final   Pneumo Ab Type 19 (59F)* 04/20/2024 2.0  >1.3 ug/mL Final   Pneumo Ab Type 2* 04/20/2024 0.4 (L)  >1.3 ug/mL Final   Pneumo Ab Type 20* 04/20/2024 1.3 (L)  >1.3 ug/mL Final   Pneumo Ab Type 22 (256F)* 04/20/2024 15.7  >1.3 ug/mL Final   Pneumo Ab Type 23 (66F)* 04/20/2024 11.1  >1.3 ug/mL Final   Pneumo Ab Type 26 (6B)* 04/20/2024 4.3  >1.3 ug/mL Final   Pneumo Ab Type 34 (10A)* 04/20/2024 10.0  >1.3 ug/mL Final   Pneumo Ab Type 43 (11A)* 04/20/2024 1.2 (L)  >1.3 ug/mL Final   Pneumo Ab Type 5* 04/20/2024 1.6  >1.3 ug/mL Final   Pneumo Ab Type 51 (56F)* 04/20/2024 2.2  >1.3 ug/mL Final   Pneumo Ab Type 54 (15B)* 04/20/2024 5.6  >1.3 ug/mL Final   Pneumo Ab Type 56 (18C)* 04/20/2024 2.0  >1.3 ug/mL Final   Pneumo Ab Type 57 (19A)* 04/20/2024 6.0  >1.3 ug/mL Final   Pneumo Ab Type 68 (9V)* 04/20/2024 2.8  >1.3 ug/mL Final   Pneumo Ab Type 70 (56F)* 04/20/2024 0.5 (L)  >1.3 ug/mL Final   IgG (Immunoglobin G), Serum 04/20/2024 494 (L)  603 - 1,613 mg/dL Final   Immunoglobulin A, (IgA) QN, Serum 04/20/2024 179  61 - 437 mg/dL Final   IgM (Immunoglobulin M), Srm 04/20/2024 30  15 - 143 mg/dL Final  Admission on 89/91/7974, Discharged on 03/25/2024  Component Date Value Ref Range Status   Sodium 03/25/2024 143  135 - 145 mmol/L Final   Potassium 03/25/2024 4.4  3.5 - 5.1 mmol/L Final   Chloride 03/25/2024 106  98 - 111  mmol/L Final   CO2 03/25/2024 23  22 - 32 mmol/L Final   Glucose, Bld 03/25/2024 103 (H)  70 - 99 mg/dL Final   BUN 89/91/7974 23  8 - 23 mg/dL Final   Creatinine, Ser 03/25/2024 1.07  0.61 - 1.24 mg/dL Final   Calcium 89/91/7974 9.3  8.9 - 10.3 mg/dL Final   GFR, Estimated 03/25/2024 >60  >60 mL/min Final   Anion gap 03/25/2024 13  5 - 15 Final   WBC 03/25/2024 7.9  4.0 - 10.5 K/uL Final   RBC 03/25/2024 4.19 (L)  4.22 - 5.81 MIL/uL Final   Hemoglobin 03/25/2024 12.1 (L)  13.0 - 17.0 g/dL Final   HCT 89/91/7974 37.2 (L)  39.0 -  52.0 % Final   MCV 03/25/2024 88.8  80.0 - 100.0 fL Final   MCH 03/25/2024 28.9  26.0 - 34.0 pg Final   MCHC 03/25/2024 32.5  30.0 - 36.0 g/dL Final   RDW 89/91/7974 14.5  11.5 - 15.5 % Final   Platelets 03/25/2024 205  150 - 400 K/uL Final   nRBC 03/25/2024 0.0  0.0 - 0.2 % Final   Neutrophils Relative % 03/25/2024 57  % Final   Neutro Abs 03/25/2024 4.5  1.7 - 7.7 K/uL Final   Lymphocytes Relative 03/25/2024 23  % Final   Lymphs Abs 03/25/2024 1.8  0.7 - 4.0 K/uL Final   Monocytes Relative 03/25/2024 8  % Final   Monocytes Absolute 03/25/2024 0.6  0.1 - 1.0 K/uL Final   Eosinophils Relative 03/25/2024 9  % Final   Eosinophils Absolute 03/25/2024 0.7 (H)  0.0 - 0.5 K/uL Final   Basophils Relative 03/25/2024 1  % Final   Basophils Absolute 03/25/2024 0.1  0.0 - 0.1 K/uL Final   Immature Granulocytes 03/25/2024 2  % Final   Abs Immature Granulocytes 03/25/2024 0.14 (H)  0.00 - 0.07 K/uL Final   Magnesium 03/25/2024 2.0  1.7 - 2.4 mg/dL Final   SARS Coronavirus 2 by RT PCR 03/25/2024 NEGATIVE  NEGATIVE Final   Influenza A by PCR 03/25/2024 NEGATIVE  NEGATIVE Final   Influenza B by PCR 03/25/2024 NEGATIVE  NEGATIVE Final   Resp Syncytial Virus by PCR 03/25/2024 NEGATIVE  NEGATIVE Final   Troponin T High Sensitivity 03/25/2024 <15  0 - 19 ng/L Final   Pro Brain Natriuretic Peptide 03/25/2024 206.0  <300.0 pg/mL Final  Office Visit on 02/27/2024  Component  Date Value Ref Range Status   FeNO level (ppb) 02/27/2024 24   Final  Clinical Support on 01/22/2024  Component Date Value Ref Range Status   FVC-Pre 01/22/2024 3.83  L Final   FVC-%Pred-Pre 01/22/2024 107  % Final   FVC-Post 01/22/2024 3.86  L Final   FVC-%Pred-Post 01/22/2024 107  % Final   FVC-%Change-Post 01/22/2024 0  % Final   FEV1-Pre 01/22/2024 2.54  L Final   FEV1-%Pred-Pre 01/22/2024 99  % Final   FEV1-Post 01/22/2024 2.63  L Final   FEV1-%Pred-Post 01/22/2024 102  % Final   FEV1-%Change-Post 01/22/2024 3  % Final   FEV6-Pre 01/22/2024 3.77  L Final   FEV6-%Pred-Pre 01/22/2024 113  % Final   FEV6-Post 01/22/2024 3.80  L Final   FEV6-%Pred-Post 01/22/2024 114  % Final   FEV6-%Change-Post 01/22/2024 0  % Final   Pre FEV1/FVC ratio 01/22/2024 66  % Final   FEV1FVC-%Pred-Pre 01/22/2024 91  % Final   Post FEV1/FVC ratio 01/22/2024 68  % Final   FEV1FVC-%Change-Post 01/22/2024 2  % Final   Pre FEV6/FVC Ratio 01/22/2024 98  % Final   FEV6FVC-%Pred-Pre 01/22/2024 105  % Final   Post FEV6/FVC ratio 01/22/2024 99  % Final   FEV6FVC-%Pred-Post 01/22/2024 106  % Final   FEV6FVC-%Change-Post 01/22/2024 0  % Final   FEF 25-75 Pre 01/22/2024 1.69  L/sec Final   FEF2575-%Pred-Pre 01/22/2024 93  % Final   FEF 25-75 Post 01/22/2024 1.72  L/sec Final   FEF2575-%Pred-Post 01/22/2024 95  % Final   FEF2575-%Change-Post 01/22/2024 2  % Final   RV 01/22/2024 3.48  L Final   RV % pred 01/22/2024 144  % Final   TLC 01/22/2024 7.21  L Final   TLC % pred 01/22/2024 113  % Final  DLCO unc 01/22/2024 19.43  ml/min/mmHg Final   DLCO unc % pred 01/22/2024 87  % Final   DL/VA 91/93/7974 6.83  ml/min/mmHg/L Final   DL/VA % pred 91/93/7974 79  % Final  Office Visit on 01/21/2024  Component Date Value Ref Range Status   Class Description Allergens 01/24/2024 Comment   Final   IgE (Immunoglobulin E), Serum 01/24/2024 31  6 - 495 IU/mL Final   D Pteronyssinus IgE 01/24/2024 <0.10  Class 0 kU/L  Final   D Farinae IgE 01/24/2024 <0.10  Class 0 kU/L Final   Cat Dander IgE 01/24/2024 <0.10  Class 0 kU/L Final   Dog Dander IgE 01/24/2024 <0.10  Class 0 kU/L Final   Mouse Urine IgE 01/24/2024 <0.10  Class 0 kU/L Final   Bermuda Grass IgE 01/24/2024 <0.10  Class 0 kU/L Final   Timothy Grass IgE 01/24/2024 <0.10  Class 0 kU/L Final   Johnson Grass IgE 01/24/2024 <0.10  Class 0 kU/L Final   Cockroach, German IgE 01/24/2024 <0.10  Class 0 kU/L Final   Penicillium Chrysogen IgE 01/24/2024 <0.10  Class 0 kU/L Final   Cladosporium Herbarum IgE 01/24/2024 <0.10  Class 0 kU/L Final   Aspergillus Fumigatus IgE 01/24/2024 <0.10  Class 0 kU/L Final   Alternaria Alternata IgE 01/24/2024 <0.10  Class 0 kU/L Final   Maple/Box Elder IgE 01/24/2024 <0.10  Class 0 kU/L Final   Common Silver Valrie IgE 01/24/2024 <0.10  Class 0 kU/L Final   Cedar, [place] IgE 01/24/2024 <0.10  Class 0 kU/L Final   Oak, White IgE 01/24/2024 <0.10  Class 0 kU/L Final   Elm, American IgE 01/24/2024 <0.10  Class 0 kU/L Final   Cottonwood IgE 01/24/2024 <0.10  Class 0 kU/L Final   Pecan, Hickory IgE 01/24/2024 <0.10  Class 0 kU/L Final   White Mulberry IgE 01/24/2024 <0.10  Class 0 kU/L Final   Ragweed, Short IgE 01/24/2024 <0.10  Class 0 kU/L Final   Pigweed, Rough IgE 01/24/2024 <0.10  Class 0 kU/L Final   Sheep Sorrel IgE Qn 01/24/2024 <0.10  Class 0 kU/L Final   WBC 01/24/2024 6.8  3.4 - 10.8 x10E3/uL Final   RBC 01/24/2024 4.35  4.14 - 5.80 x10E6/uL Final   Hemoglobin 01/24/2024 13.0  13.0 - 17.7 g/dL Final   Hematocrit 91/91/7974 39.4  37.5 - 51.0 % Final   MCV 01/24/2024 91  79 - 97 fL Final   MCH 01/24/2024 29.9  26.6 - 33.0 pg Final   MCHC 01/24/2024 33.0  31.5 - 35.7 g/dL Final   RDW 91/91/7974 13.9  11.6 - 15.4 % Final   Platelets 01/24/2024 211  150 - 450 x10E3/uL Final   Neutrophils 01/24/2024 60  Not Estab. % Final   Lymphs 01/24/2024 24  Not Estab. % Final   Monocytes 01/24/2024 7  Not Estab. % Final    Eos 01/24/2024 8  Not Estab. % Final   Basos 01/24/2024 1  Not Estab. % Final   Neutrophils Absolute 01/24/2024 4.1  1.4 - 7.0 x10E3/uL Final   Lymphocytes Absolute 01/24/2024 1.6  0.7 - 3.1 x10E3/uL Final   Monocytes Absolute 01/24/2024 0.5  0.1 - 0.9 x10E3/uL Final   EOS (ABSOLUTE) 01/24/2024 0.5 (H)  0.0 - 0.4 x10E3/uL Final   Basophils Absolute 01/24/2024 0.0  0.0 - 0.2 x10E3/uL Final   Immature Granulocytes 01/24/2024 0  Not Estab. % Final   Immature Grans (Abs) 01/24/2024 0.0  0.0 - 0.1 x10E3/uL Final   IgG (  Immunoglobin G), Serum 01/24/2024 481 (L)  603 - 1,613 mg/dL Final   Immunoglobulin A, (IgA) QN, Serum 01/24/2024 180  61 - 437 mg/dL Final   IgM (Immunoglobulin M), Srm 01/24/2024 25  15 - 143 mg/dL Final   Pneumo Ab Type 1* 01/24/2024 0.5 (L)  >1.3 ug/mL Final   Pneumo Ab Type 3* 01/24/2024 <0.1 (L)  >1.3 ug/mL Final   Pneumo Ab Type 4* 01/24/2024 0.4 (L)  >1.3 ug/mL Final   Pneumo Ab Type 8* 01/24/2024 15.1  >1.3 ug/mL Final   Pneumo Ab Type 9 (9N)* 01/24/2024 1.8  >1.3 ug/mL Final   Pneumo Ab Type 12 (45F)* 01/24/2024 <0.1 (L)  >1.3 ug/mL Final   Pneumo Ab Type 14* 01/24/2024 0.5 (L)  >1.3 ug/mL Final   Pneumo Ab Type 17 (86F)* 01/24/2024 0.5 (L)  >1.3 ug/mL Final   Pneumo Ab Type 19 (68F)* 01/24/2024 1.5  >1.3 ug/mL Final   Pneumo Ab Type 2* 01/24/2024 <0.2 (L)  >1.3 ug/mL Final   Pneumo Ab Type 20* 01/24/2024 0.5 (L)  >1.3 ug/mL Final   Pneumo Ab Type 22 (66F)* 01/24/2024 0.2 (L)  >1.3 ug/mL Final   Pneumo Ab Type 23 (28F)* 01/24/2024 0.8 (L)  >1.3 ug/mL Final   Pneumo Ab Type 26 (6B)* 01/24/2024 <0.1 (L)  >1.3 ug/mL Final   Pneumo Ab Type 34 (10A)* 01/24/2024 1.0 (L)  >1.3 ug/mL Final   Pneumo Ab Type 43 (11A)* 01/24/2024 1.1 (L)  >1.3 ug/mL Final   Pneumo Ab Type 5* 01/24/2024 1.0 (L)  >1.3 ug/mL Final   Pneumo Ab Type 51 (22F)* 01/24/2024 0.3 (L)  >1.3 ug/mL Final   Pneumo Ab Type 54 (15B)* 01/24/2024 1.3 (L)  >1.3 ug/mL Final   Pneumo Ab Type 56 (18C)* 01/24/2024 0.1  (L)  >1.3 ug/mL Final   Pneumo Ab Type 57 (19A)* 01/24/2024 4.2  >1.3 ug/mL Final   Pneumo Ab Type 68 (9V)* 01/24/2024 1.1 (L)  >1.3 ug/mL Final   Pneumo Ab Type 70 (86F)* 01/24/2024 0.2 (L)  >1.3 ug/mL Final   Tetanus Ab, IgG 01/24/2024 0.90  <0.10 IU/mL Final   Diphtheria Ab 01/24/2024 0.21  <0.10 IU/mL Final   Compl, Total (CH50) 01/24/2024 50  >41 U/mL Final  Lab on 04/18/2023  Component Date Value Ref Range Status   Vitamin B-12 04/18/2023 844  211 - 911 pg/mL Final   Folate 04/18/2023 17.0  >5.9 ng/mL Final   WBC 04/18/2023 6.7  4.0 - 10.5 K/uL Final   RBC 04/18/2023 4.29  4.22 - 5.81 Mil/uL Final   Platelets 04/18/2023 207.0  150.0 - 400.0 K/uL Final   Hemoglobin 04/18/2023 12.0 (L)  13.0 - 17.0 g/dL Final   HCT 89/68/7975 37.1 (L)  39.0 - 52.0 % Final   MCV 04/18/2023 86.5  78.0 - 100.0 fl Final   MCHC 04/18/2023 32.3  30.0 - 36.0 g/dL Final   RDW 89/68/7975 15.0  11.5 - 15.5 % Final   Sodium 04/18/2023 142  135 - 145 mEq/L Final   Potassium 04/18/2023 4.4  3.5 - 5.1 mEq/L Final   Chloride 04/18/2023 106  96 - 112 mEq/L Final   CO2 04/18/2023 29  19 - 32 mEq/L Final   Glucose, Bld 04/18/2023 119 (H)  70 - 99 mg/dL Final   BUN 89/68/7975 26 (H)  6 - 23 mg/dL Final   Creatinine, Ser 04/18/2023 1.05  0.40 - 1.50 mg/dL Final   Total Bilirubin 04/18/2023 1.1  0.2 - 1.2 mg/dL Final  Alkaline Phosphatase 04/18/2023 49  39 - 117 U/L Final   AST 04/18/2023 19  0 - 37 U/L Final   ALT 04/18/2023 16  0 - 53 U/L Final   Total Protein 04/18/2023 6.1  6.0 - 8.3 g/dL Final   Albumin 89/68/7975 3.9  3.5 - 5.2 g/dL Final   GFR 89/68/7975 68.77  >60.00 mL/min Final   Calcium 04/18/2023 9.1  8.4 - 10.5 mg/dL Final   Cholesterol 89/68/7975 124  0 - 200 mg/dL Final   Triglycerides 89/68/7975 92.0  0.0 - 149.0 mg/dL Final   HDL 89/68/7975 45.30  >39.00 mg/dL Final   VLDL 89/68/7975 18.4  0.0 - 40.0 mg/dL Final   LDL Cholesterol 04/18/2023 60  0 - 99 mg/dL Final   Total CHOL/HDL Ratio  04/18/2023 3   Final   NonHDL 04/18/2023 78.68   Final   Magnesium 04/18/2023 2.0  1.5 - 2.5 mg/dL Final  Office Visit on 02/19/2023  Component Date Value Ref Range Status   SARS Coronavirus 2 Ag 02/19/2023 Negative  Negative Final  Office Visit on 01/18/2023  Component Date Value Ref Range Status   SARS Coronavirus 2 Ag 01/18/2023 Negative  Negative Final  Lab on 08/02/2022  Component Date Value Ref Range Status   Magnesium 08/02/2022 1.9  1.5 - 2.5 mg/dL Final   Cholesterol 97/84/7975 129  0 - 200 mg/dL Final   Triglycerides 97/84/7975 77.0  0.0 - 149.0 mg/dL Final   HDL 97/84/7975 48.30  >39.00 mg/dL Final   VLDL 97/84/7975 15.4  0.0 - 40.0 mg/dL Final   LDL Cholesterol 08/02/2022 65  0 - 99 mg/dL Final   Total CHOL/HDL Ratio 08/02/2022 3   Final   NonHDL 08/02/2022 80.50   Final   Sodium 08/02/2022 142  135 - 145 mEq/L Final   Potassium 08/02/2022 4.9  3.5 - 5.1 mEq/L Final   Chloride 08/02/2022 107  96 - 112 mEq/L Final   CO2 08/02/2022 27  19 - 32 mEq/L Final   Glucose, Bld 08/02/2022 113 (H)  70 - 99 mg/dL Final   BUN 97/84/7975 26 (H)  6 - 23 mg/dL Final   Creatinine, Ser 08/02/2022 1.04  0.40 - 1.50 mg/dL Final   Total Bilirubin 08/02/2022 1.0  0.2 - 1.2 mg/dL Final   Alkaline Phosphatase 08/02/2022 56  39 - 117 U/L Final   AST 08/02/2022 15  0 - 37 U/L Final   ALT 08/02/2022 12  0 - 53 U/L Final   Total Protein 08/02/2022 6.3  6.0 - 8.3 g/dL Final   Albumin 97/84/7975 4.0  3.5 - 5.2 g/dL Final   GFR 97/84/7975 69.91  >60.00 mL/min Final   Calcium 08/02/2022 9.4  8.4 - 10.5 mg/dL Final   WBC 97/84/7975 8.4  4.0 - 10.5 K/uL Final   RBC 08/02/2022 4.40  4.22 - 5.81 Mil/uL Final   Hemoglobin 08/02/2022 12.0 (L)  13.0 - 17.0 g/dL Final   HCT 97/84/7975 37.2 (L)  39.0 - 52.0 % Final   MCV 08/02/2022 84.5  78.0 - 100.0 fl Final   MCHC 08/02/2022 32.2  30.0 - 36.0 g/dL Final   RDW 97/84/7975 17.5 (H)  11.5 - 15.5 % Final   Platelets 08/02/2022 220.0  150.0 - 400.0 K/uL  Final   Neutrophils Relative % 08/02/2022 64.0  43.0 - 77.0 % Final   Lymphocytes Relative 08/02/2022 22.1  12.0 - 46.0 % Final   Monocytes Relative 08/02/2022 8.0  3.0 - 12.0 % Final  Eosinophils Relative 08/02/2022 5.1 (H)  0.0 - 5.0 % Final   Basophils Relative 08/02/2022 0.8  0.0 - 3.0 % Final   Neutro Abs 08/02/2022 5.3  1.4 - 7.7 K/uL Final   Lymphs Abs 08/02/2022 1.8  0.7 - 4.0 K/uL Final   Monocytes Absolute 08/02/2022 0.7  0.1 - 1.0 K/uL Final   Eosinophils Absolute 08/02/2022 0.4  0.0 - 0.7 K/uL Final   Basophils Absolute 08/02/2022 0.1  0.0 - 0.1 K/uL Final   Vitamin B-12 08/02/2022 446  211 - 911 pg/mL Final   Folate 08/02/2022 11.2  >5.9 ng/mL Final  No image results found. MR PROSTATE W WO CONTRAST Result Date: 06/05/2024 EXAM: MRI PROSTATE WITH AND WITHOUT INTRAVENOUS CONTRAST 06/02/2024 04:38:27 PM TECHNIQUE: Multiparametric MRI imaging of the prostate with and without dynamic contrast enhanced imaging and diffusion weighted imaging was performed. Contrast: 8 mL of [employer]ue[employer] . Dynacad was utilized in analysis of images. 3-dimensional surface reconstruction of the prostate gland and any described PI-RADS 3 or greater lesions was performed by the radiologist using [employer] on an independent workstation as part of prostate/lesion [employer]. COMPARISON: CT pelvis 02/15/2022. CLINICAL HISTORY: Elevated PSA level. FINDINGS: PROSTATE: Prostate volume by 3D volumetric analysis: 45.8 cubic centimeters (4.7 x 3.0 x 5.0 cm). Encapsulated nodularity in the transition zone compatible with benign prostatic hypertrophy. Region of interest #1: PI-RADS category 3 lesion of the left posteromedial peripheral zone at the apex with reduced T2 signal near the midline (image 54 series 6) corresponding to mildly reduced ADC map activity (image 18 series 8). This measures 0.24 cc (0.9 x 0.4 x 0.6 cm). SEMINAL VESICLES: Unremarkable. NEUROVASCULAR BUNDLE: Unremarkable. LYMPHADENOPATHY: No  lymphadenopathy. BLADDER: Small cellules of the urinary bladder noted. BOWEL: Sigmoid colon diverticulosis. The visualized bowel is otherwise without acute abnormality. PERITONEAL CAVITY: No free fluid. BONES: Normal bone marrow signal intensity. No suspicious or aggressive osseous lesions. SOFT TISSUES: Prior left groin hernia repair. No other focal abnormality. IMPRESSION: 1. PI-RADS category 3 lesion in the left posteromedial peripheral zone at the apex, measuring 0.24 cc. Targeting data sent to the [facility] system. 2. Benign prostatic hypertrophy with prostate volume of 45.8 mL. 3. Incidental sigmoid diverticulosis, postsurgical changes of prior left groin hernia repair, and small urinary bladder cellules, as detailed in the report body. Electronically signed by: Marva Hendryx Salvage MD 06/05/2024 07:44 AM EST RP Workstation: [id number]         ASSESSMENT & PLAN   Assessment & Plan Bronchopneumonia Bronchopneumonia due to chronic aspiration and reflux   Chronic bronchopneumonia has worsened over the last five days, exacerbated by a recent reflux event likely triggered by a [employer] meal. Symptoms include wheezing and right-sided lung consolidation, with no fever present. He is at high risk of aspiration due to reflux and esophagectomy history. Current management includes antibiotics and steroids, but long-term resolution requires addressing reflux and potential surgical intervention. A chest x-ray is ordered to assess pneumonia extent. Prescribed Augmentin  for 10 days and prednisone  for 7 days. Provided ER precautions for monitoring oxygen levels and symptoms. Referred to thoracic surgery for evaluation of potential surgical intervention to reduce reflux. Advised to avoid spicy foods and maintain an elevated sleeping position. 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 Reflux events contribute to bronchopneumonia. Discussed potential surgical intervention to tighten the esophageal sphincter.  Prescribed a new reflux medication, considering cost. Referred to thoracic surgery for evaluation of potential surgical intervention to reduce reflux. Advised to avoid spicy foods and maintain an elevated sleeping position. Prostate nodule Prostate nodule with elevated PSA, biopsy planned   A prostate nodule with an elevated PSA of 10.5 and MRI PIRADS score of 3 is noted. A biopsy is scheduled for February 5th, with a family history of prostate cancer. The biopsy carries a risk of aspiration due to reflux history. Proceed with the scheduled prostate biopsy, ensuring antibiotics are taken prior to prevent infection. Advised to maintain an upright position during and after the biopsy to prevent aspiration.  Discussed the importance of pneumonia vaccinations due to a high risk of pneumonia. Recommended new pneumonia vaccines, Capsevax and RSV, for better protection. Emphasized that these vaccines are more critical than prostate management for current health risks. Advised to obtain vaccines at the pharmacy.  ORDER ASSOCIATIONS  #   DIAGNOSIS / CONDITION ICD-10 ENCOUNTER ORDER     ICD-10-CM   1. Bronchopneumonia  J18.0 amoxicillin -clavulanate (AUGMENTIN ) 875-125 MG tablet    predniSONE  (DELTASONE ) 20 MG tablet    Ambulatory referral to Cardiothoracic Surgery    DG Chest 2 View    2. Hiatal hernia with GERD and esophagitis  K44.9 Vonoprazan Fumarate  (VOQUEZNA ) 20 MG TABS   K21.00 DISCONTINUED: Amoxicillin  & Vonoprazan (VOQUEZNA  DUAL PAK) 500 & 20 MG THPK    3. Gastroesophageal reflux disease, unspecified whether esophagitis present  K21.9     4. Prostate nodule  N40.2          Orders Placed in Encounter:   Imaging Orders         DG Chest 2 View      Referral Orders         Ambulatory referral to Cardiothoracic Surgery     Meds ordered this encounter  Medications   amoxicillin -clavulanate (AUGMENTIN ) 875-125 MG tablet    Sig: Take 1 tablet by mouth 2 (two) times daily.    Dispense:  20 tablet    Refill:  0   predniSONE  (DELTASONE ) 20 MG tablet    Sig: Take 2 pills for 3 days, 1 pill for 4 days    Dispense:  10 tablet    Refill:  0   DISCONTD: Amoxicillin  & Vonoprazan (VOQUEZNA  DUAL PAK) 500 & 20 MG THPK    Sig: Take 1 tablet by mouth daily at 6 (six) AM.    Dispense:  90 each    Refill:  4    Savings card at [email or web address] or call [phone]   Vonoprazan Fumarate  (VOQUEZNA ) 20 MG TABS    Sig: Take 1 tablet by mouth daily at 6 (six) AM.    Dispense:  90 tablet    Refill:  4    Orders Placed This Encounter  Procedures   DG Chest 2 View    Reason for Exam (SYMPTOM  OR DIAGNOSIS REQUIRED):   pneumonia    Preferred imaging location?:   [facility]   Ambulatory referral to Cardiothoracic Surgery    Referral Priority:   Routine    Referral Type:   Surgical    Referral Reason:   Specialty Services Required    Requested Specialty:   Cardiothoracic Surgery    Number of Visits Requested:   1    On the day of the visit, I dedicated 31 minutes to both direct and indirect patient care activities.  The time was spent: History: I obtained, documented,  and reviewed a thorough medical history. I reviewed the patient's reported symptoms and clarified their context and significance in relation to the current visit. Examination: I conducted a medically appropriate physical evaluation. Data Synthesis: I synthesized information for clinical decision-making. Communication: I communicated clinical status and plan to the patient and/or family/caregiver. Counseling & Education: I provided personalized counseling on condition and treatment. Documentation: Documenting clinical findings and medical decision-making, and creating  and providing documentation for patient review. Treatment Plan: I worked collaboratively with the patient to formulate and communicate an individualized plan (including shared decision-making). Orders: I placed necessary orders (medications, labs, imaging, referrals) in the EMR. Referrals and Communication: I referred the patient to other health care professionals as needed and communicated with them to ensure coordinated care.  This time was spent independently of any separately billable procedures. Please note that this statement is intended to provide a clear and comprehensive account of the time and services provided during the patient's visit.  The extended time spent was necessary to provide safe, effective, and comprehensive care due to the following factors:, Data Analysis & Complex Decision-Making: I performed in-depth data review and complex treatment planning tailored to the patient's unique clinical profile., and Patient Requested In-Depth Education on Disease Management: At the patient's request, I provided additional time to thoroughly educate them on self management of their chronic condition, review lifestyle modifications, and clarify follow up plans-time that exceeded typical encounter duration and was not separately billable.     This document was synthesized by artificial intelligence ([employer]) using HIPAA-compliant recording of the clinical interaction;   We discussed the use of AI scribe software for clinical note transcription with the patient, who gave verbal consent to proceed. additional Info: This encounter employed state-of-the-art, real-time, collaborative documentation. The patient actively reviewed and assisted in updating their electronic medical record on a shared screen, ensuring transparency and facilitating joint problem-solving for the problem list, overview, and plan. This approach promotes accurate, informed care. The treatment plan was discussed and reviewed in detail,  including medication safety, potential side effects, and all patient questions. We confirmed understanding and comfort with the plan. Follow-up instructions were established, including contacting the office for any concerns, returning if symptoms worsen, persist, or new symptoms develop, and precautions for potential emergency department visits.

## 2024-07-16 NOTE — Patient Instructions (Addendum)
 It was a pleasure seeing you today! Your health and satisfaction are our top priorities.  Bernardino Cone, MD  VISIT SUMMARY: During your visit, we discussed your worsening bronchitis symptoms, which are likely related to your acid reflux and history of esophageal surgery. We also reviewed your upcoming prostate biopsy and general health maintenance, including vaccinations.  YOUR PLAN: -BRONCHOPNEUMONIA DUE TO CHRONIC ASPIRATION AND REFLUX: Bronchopneumonia is a type of lung infection that can be worsened by inhaling food or stomach acid into the lungs. Your symptoms have worsened recently, likely due to a reflux event. We have prescribed Augmentin  for 10 days and prednisone  for 7 days to manage the infection and inflammation. A chest x-ray has been ordered to assess the extent of pneumonia. You should avoid spicy foods and maintain an elevated sleeping position. We have also referred you to thoracic surgery to evaluate potential surgical intervention to reduce reflux.  -GASTROESOPHAGEAL REFLUX DISEASE POST-ESOPHAGECTOMY WITH BARRETT'S ESOPHAGUS AND HIATAL HERNIA: Gastroesophageal reflux disease (GERD) is a condition where stomach acid frequently flows back into the esophagus, causing irritation. This condition persists despite your current medications. We discussed the possibility of surgical intervention to tighten the esophageal sphincter. A new reflux medication has been prescribed, considering cost. You should continue to avoid spicy foods and maintain an elevated sleeping position. We have referred you to thoracic surgery for further evaluation.  -PROSTATE NODULE WITH ELEVATED PSA, BIOPSY PLANNED: A prostate nodule with an elevated PSA level indicates a potential risk for prostate cancer. You have a biopsy scheduled for February 5th. Due to your reflux history, there is a risk of aspiration during the biopsy. Ensure you take antibiotics prior to the procedure to prevent infection and maintain an  upright position during and after the biopsy.  -GENERAL HEALTH MAINTENANCE: We discussed the importance of staying up-to-date with your pneumonia vaccinations due to your high risk of pneumonia. We recommend getting the new pneumonia vaccines, Capsevax and RSV, for better protection. These vaccines are more critical for your current health risks than prostate management. Please obtain these vaccines at the pharmacy.  INSTRUCTIONS: Please follow up with the chest x-ray as ordered and attend your thoracic surgery consultation. Ensure you take antibiotics before your prostate biopsy on February 5th and maintain an upright position during and after the procedure. Obtain the recommended pneumonia vaccines at the pharmacy.  Your Providers PCP: Cone Bernardino MATSU, MD,  [phone]) Referring Provider: Cone Bernardino MATSU, MD,  [phone]) Care Team Provider: Anner Alm ORN, MD,  [phone]) Care Team Provider: Rollin Dover, MD,  [phone]) Care Team Provider: Meade Verdon RAMAN, MD,  [phone]) Care Team Provider: Joshua Alm Hamilton, MD,  [phone])  NEXT STEPS: [x]  Early Intervention: Schedule sooner appointment, call our on-call services, or go to emergency room if there is any significant Increase in pain or discomfort New or worsening symptoms Sudden or severe changes in your health [x]  Flexible Follow-Up: We recommend a No follow-ups on file. for optimal routine care. This allows for progress monitoring and treatment adjustments. [x]  Preventive Care: Schedule your annual preventive care visit! It's typically covered by insurance and helps identify potential health issues early. [x]  Lab & X-ray Appointments: Incomplete tests scheduled today, or call to schedule. X-rays: [facility] (M-F, 8:30am-noon or 1pm-5pm). [x]  Medical Information Release: Sign a release form at front desk to obtain relevant medical information we don't have.  MAKING THE MOST OF OUR FOCUSED  20 MINUTE APPOINTMENTS: [x]   Clearly state your top concerns at the  beginning of the visit to focus our discussion [x]   If you anticipate you will need more time, please inform the front desk during scheduling - we can book multiple appointments in the same week. [x]   If you have transportation problems- use our convenient video appointments or ask about transportation support. [x]   We can get down to business faster if you use MyChart to update information before the visit and submit non-urgent questions before your visit. Thank you for taking the time to provide details through MyChart.  Let our nurse know and she can import this information into your encounter documents.  Arrival and Wait Times: [x]   Arriving on time ensures that everyone receives prompt attention. [x]   Early morning (8a) and afternoon (1p) appointments tend to have shortest wait times. [x]   Unfortunately, we cannot delay appointments for late arrivals or hold slots during phone calls.  Getting Answers and Following Up [x]   Simple Questions & Concerns: For quick questions or basic follow-up after your visit, reach us  at [phone] or MyChart messaging. [x]   Complex Concerns: If your concern is more complex, scheduling an appointment might be best. Discuss this with the staff to find the most suitable option. [x]   Lab & Imaging Results: We'll contact you directly if results are abnormal or you don't use MyChart. Most normal results will be on MyChart within 2-3 business days, with a review message from Dr. Jesus. Haven't heard back in 2 weeks? Need results sooner? Contact us  at [phone]. [x]   Referrals: Our referral coordinator will manage specialist referrals. The specialist's office should contact you within 2 weeks to schedule an appointment. Call us  if you haven't heard from them after 2 weeks.  Staying Connected [x]   MyChart: Activate your MyChart for the fastest way to access results and message us . See the  last page of this paperwork for instructions on how to activate.  Bring to Your Next Appointment [x]   Medications: Please bring all your medication bottles to your next appointment to ensure we have an accurate record of your prescriptions. [x]   Health Diaries: If you're monitoring any health conditions at home, keeping a diary of your readings can be very helpful for discussions at your next appointment.  Billing [x]   X-ray & Lab Orders: These are billed by separate companies. Contact the invoicing company directly for questions or concerns. [x]   Visit Charges: Discuss any billing inquiries with our administrative services team.  Your Satisfaction Matters [x]   Share Your Experience: We strive for your satisfaction! If you have any complaints, or preferably compliments, please let Dr. Jesus know directly or contact our Practice Administrators, Manuelita Rubin or [employer], by asking at the front desk.   Reviewing Your Records [x]   Review this early draft of your clinical encounter notes below and the final encounter summary tomorrow on MyChart after its been completed.  All orders placed so far are visible here: Bronchopneumonia -     Amoxicillin -Pot Clavulanate; Take 1 tablet by mouth 2 (two) times daily.  Dispense: 20 tablet; Refill: 0 -     predniSONE ; Take 2 pills for 3 days, 1 pill for 4 days  Dispense: 10 tablet; Refill: 0 -     Ambulatory referral to Cardiothoracic Surgery -     DG Chest 2 View  Hiatal hernia with GERD and esophagitis -     Voquezna ; Take 1 tablet by mouth daily at 6 (six) AM.  Dispense: 90 tablet; Refill: 4  Gastroesophageal reflux disease, unspecified whether  esophagitis present  Prostate nodule        Home Monitoring Instructions for Pneumonia  What You're Being Treated For   You have pneumonia (lung infection) and are being treated with antibiotics (amoxicillin -clavulanate) and prednisone . Your doctor believes you can safely recover at home with  close monitoring. Your Medications   Amoxicillin -Clavulanate (Augmentin ): Take 875 mg by mouth twice daily with food for the full course prescribed (typically 5-7 days). Complete the entire course even if you feel better.[1][2][3]  Prednisone : Take as prescribed by your doctor. How to Monitor Yourself at Home  Check Your Oxygen Level (Most Important)   - Use a pulse oximeter on your finger to check your oxygen saturation  - Check at least twice daily (morning and evening) and whenever you feel short of breath  - Normal range: 95% or higher  - Concerning: Below 92%  - Emergency: Below 90%[4] Check Your Temperature   - Check at least twice daily (morning and evening)  - Normal: Below 100.26F (38C)  - You should be fever-free for at least 48 hours before stopping antibiotics[1] Monitor Your Breathing   - Count your breaths for one full minute while resting  - Normal: Less than 20 breaths per minute  - Concerning: 30 or more breaths per minute[1] Check Your Blood Pressure (if you have a home monitor)   - Concerning: Systolic (top number) below 90 or diastolic (bottom number) below 60[1] Warning Signs - Return to the Emergency Department If You Experience:   Go to the ER immediately if you have any of these:  - Oxygen saturation below 90% on pulse oximeter[4]  - Severe shortness of breath or difficulty breathing  - Breathing rate of 30 breaths per minute or more  - Confusion or difficulty staying awake  - Chest pain that worsens with breathing  - Coughing up blood  - Blood pressure with systolic below 90 mm Hg  - Inability to keep down liquids or medications  - Symptoms getting worse instead of better after 48-72 hours on antibiotics[5][1] Expected Recovery Timeline   - You should start feeling somewhat better within 48-72 hours of starting antibiotics  - Fever should resolve within 2-3 days  - Complete recovery may take 1-2 weeks  - Cough may  persist for several weeks even after other symptoms improve[1] General Care at Home   - Rest as much as possible  - Drink plenty of fluids  - Sleep with your head elevated (use extra pillows)  - Avoid lying completely flat, especially after eating  - Take all medications exactly as prescribed Follow-Up   Contact your primary care doctor within 2-3 days to report your progress. Seek earlier follow-up if you're not improving as expected. References Community-Acquired Pneumonia. File TM, Rubin SHILLING. The [place] Journal of Medicine. 2023;389(7):632-641. doi:10.1056/NEJMcp2303286. Aspiration Pneumonia. Levorn CLINT Evert MS. The [place] Journal of Medicine. 2019;380(7):651-663. doi:10.1056/NEJMra1714562. AMOXICILLIN  AND CLAVULANATE POTASSIUM. Food and Drug Administration. Updated date: 2023-12-10. Oxygen Saturations Less Than 92% Are Associated With Major Adverse Events in Outpatients With Pneumonia: A Population-Based Cohort Study. Majumdar SR, Eurich DT, Gamble JM, Senthilselvan A, Marrie TJ. Clinical Infectious Diseases : An Official Publication of the Infectious Diseases Society of America. 2011;52(3):325-31. doi:10.1093/cid/ciq076. Diagnosis and Treatment of Adults With Community-Acquired Pneumonia. An Official Clinical Practice Guideline of the American Thoracic Society and Infectious Diseases Society of America. Metlay JP, Waterer GW, Long AC, et al. American Journal of Respiratory and Critical Care Medicine. 2019;200(7):e45-e67. doi:10.1164/rccm.([phone].  Reducing recurrent aspiration pneumonia in this patient requires a multipronged approach combining advanced diagnostics, tailored antireflux therapies, and potentially surgical or endoscopic interventions, while minimizing the risks of frequent antibiotic and steroid use. Pathophysiology and Diagnostic Considerations   The patient's altered anatomy from partial esophagectomy likely contributes to  multifactorial reflux mechanisms, including both acid and non-acid reflux, impaired esophageal clearance, and loss of normal antireflux barriers.[1][2] The delayed onset suggests progressive anatomical or functional deterioration. Hypopharyngeal-esophageal multichannel intraluminal impedance-pH monitoring can characterize reflux patterns and guide individualized treatment, though cost and availability may limit access.[2] A multidisciplinary evaluation involving gastroenterology, pulmonology, and thoracic surgery is essential for comprehensive management.[1] Cutting-Edge Management Strategies Medical Therapies  Alginates offer mechanical barrier protection and may provide superior symptom relief compared to PPIs alone in LPR, particularly for non-acid reflux.[2][3][4] Baclofen reduces transient lower esophageal sphincter relaxations and may help in selected patients.[3] Pepsin inhibitors represent a promising emerging therapy for LPR when surgery is not feasible.[2] Behavioral interventions and neuromodulators may address laryngeal hypersensitivity, which can coexist with or mimic reflux symptoms.[2][3]  However, chronic PPI use in patients with aspiration risk may paradoxically increase pneumonia risk through gastric dysbiosis and altered airway microbiome, despite their continued role in Barrett's esophagus chemoprevention.[4][5][6] In adult stroke patients with dysphagia, acid suppression has been associated with a 2-fold increased risk of pneumonia.[6] Surgical and Endoscopic Interventions   For post-esophagectomy patients with severe reflux, novel reconstruction techniques show promise. The SMART (Short Middle Overlap Anastomosis Reinforced with Toupet-like fundoplication) method demonstrated no anastomotic leakage and minimal reflux symptoms in a small series of 10 patients.[7] Endoscopic anti-reflux valve creation using full-thickness suturing devices has shown feasibility in animal models for  post-esophagectomy reflux.[8]  Traditional antireflux procedures require careful consideration. Laparoscopic fundoplication, magnetic sphincter augmentation (LINX), and transoral incisionless fundoplication are options for proven GERD, but candidacy requires confirmatory reflux testing, exclusion of achalasia, and assessment of esophageal motility.[9][10] The American Gastroenterological Association recommends these procedures only in highly selected patients with objective GERD evidence, noting that lack of PPI response predicts poor surgical outcomes.[11] In post-esophagectomy anatomy, surgical options may be technically challenging and require experienced surgeons. Risks of Frequent Antibiotic and Steroid Use   Prophylactic antibiotics after aspiration are not recommended and may select for resistant organisms.[12] Empirical antibiotics should be reserved for patients with persistent symptoms beyond 48 hours or those with conditions predisposing to gastric colonization.[12]  Repeated short-course corticosteroids carry significant risks, including hyperglycemia (44% vs 23% in placebo), hypernatremia, secondary infections, gastrointestinal bleeding, and neuromyopathy.[13] While low-dose corticosteroids for severe pneumonia may not increase ICU superinfection risk acutely, longer follow-up data show increased recurrent pneumonia (8% vs 3%) and secondary infections (17% vs 10%).[13] The patient's current regimen of >10 courses annually substantially elevates these risks. Recommended Approach   1. Obtain impedance-pH monitoring to characterize reflux patterns and guide therapy selection[1][2]  2. Trial alginate-based formulations as they may be more effective than PPIs for non-acid reflux and carry lower infection risk[2][3][4]  3. Consider multidisciplinary surgical evaluation for novel post-esophagectomy antireflux procedures or endoscopic valve creation, particularly given the severity and frequency  of complications[7][8]  4. Minimize antibiotic and steroid courses through preventive strategies: semirecumbent positioning during sleep/feeding, review of medications that increase aspiration risk (benzodiazepines, antipsychotics), and consideration of angiotensin-converting enzyme inhibitors which may reduce aspiration pneumonia risk in high-risk patients[5]  5. Address laryngeal hypersensitivity with behavioral interventions if present[2][3]  Well-designed studies are urgently needed to establish optimal management strategies for post-esophagectomy reflux and aspiration, as current evidence consists primarily  of observational data and small case series.[11][7][14]   Would you like me to summarize the latest evidence on the effectiveness and safety of alginate-based therapies specifically for non-acid reflux and microaspiration in post-esophagectomy patients, including any comparative studies with PPIs or other agents? This could help clarify their role as a frontline intervention in your patient's scenario. References Update on Extraesophageal Manifestations of Gastroesophageal Reflux. Winfred AM, Candyce [facility]. Current Opinion in Gastroenterology. 2024;40(4):305-313. doi:10.1097/MOG.0000000000001037. Advances in Laryngopharyngeal Reflux: Etiology, Diagnosis, and Management. Hezekiah CAMPS, Aoun J, Husain I, et al. Annals of the [place]  Academy of Sciences. 2024;1541(1):53-62. doi:10.1111/nyas.84757. An Update on Current Treatment Strategies for Laryngopharyngeal Reflux Symptoms. Sherill BARON, Hope [facility], [place] PA, [place], [place] R. Annals of the [place]  Academy of Sciences. 2022;1510(1):5-17. doi:10.1111/nyas.85271. Gastrointestinal Pharmacology: Practical Tips for the Esophagologist. Wendelin BROCKS, Sullivan MILUS Flatten [facility], Hunt RH. Annals of the [place]  Academy of Sciences. 2020;1481(1):90-107. doi:10.1111/nyas.14447. Medications to Modify Aspiration Risk: Those That Add to Risk and Those That May Reduce  Risk. Elnora PARAS, Mergulho P, Reynolds F. Seminars in Respiratory and Critical Care Medicine. 2024;45(6):694-700. doi:10.1055/s-[id number]. Association of Proton Pump Inhibitors With Hospitalization Risk in Children With Oropharyngeal Dysphagia. Cleatus DR, Merilee PD, Arcelia POUR, McSweeney ME, Rosen RL. JAMA Otolaryngology-- Head & Neck Surgery. 2018;144(12):1116-1124. doi:10.1001/jamaoto.7981.8080. Short Middle Overlap Anastomosis Reinforced With Toupet-Like Fundoplication (SMART): A Novel Anti-Reflux Technique for Lower Mediastinal Esophagogastrostomy After Esophagogastric Junction Cancer Surgery. Elspeth GORMAN Samella ONEIDA Nicholaus GORMAN, et al. Journal of Gastrointestinal Surgery : Official Journal of the Society for Surgery of the Alimentary Tract. 2025;:102212. doi:10.1016/j.gassur.7974.897787. Endoscopic Anti-Reflux Valve for Post-Esophagectomy Reflux: An Animal Study. Yanagimoto Y, Yamasaki M, Nagase H, et al. Endoscopy. 2016;48(12):1119-1124. doi:10.1055/s-[id number]. Gastroesophageal Reflux Disease. Fass R. The [place] Journal of Medicine. 2022;387(13):1207-1216. doi:10.1056/NEJMcp2114026. AGA Clinical Practice Update on the Personalized Approach to the Evaluation and Management of GERD: Expert Review. Cindi SAUNDERS, Gyawali CP, Pandolfino JE. Clinical Gastroenterology and Hepatology : The Official Clinical Practice Journal of the American Gastroenterological Association. 2022;20(5):984-994.e1. doi:10.1016/j.cgh.2022.01.025. AGA Clinical Practice Update on the Diagnosis and Management of Extraesophageal Gastroesophageal Reflux Disease: Expert Review. Laurence CHANG, Vela MF, Andra LONNE Rubenstein DA. Clinical Gastroenterology and Hepatology : The Official Clinical Practice Journal of the American Gastroenterological Association. 2023;21(6):1414-1421.e3. doi:10.1016/j.cgh.2023.01.040. Aspiration Pneumonitis and Aspiration Pneumonia. Marik PE. The [place] Journal of Medicine. 2001;344(9):665-71.  doi:10.1056/[id number]. Low-Dose Corticosteroids for Critically Ill Adults With Severe Pulmonary Infections: A Review. Pirracchio R, Tomi KATHEE Niece M. JAMA. 2024;332(4):318-328. doi:10.1001/jama.7975.3903. Surgical Treatment of Extraesophageal Manifestations of Gastroesophageal Reflux Disease. Tillie JULIANNA Ada AL, Rich BRAVO, Fisichella PM. World Journal of Surgery. 2017;41(10):2566-2571. doi:10.1007/s00268-([phone])[phone].

## 2024-07-17 ENCOUNTER — Telehealth: Payer: Self-pay

## 2024-07-17 ENCOUNTER — Other Ambulatory Visit (HOSPITAL_COMMUNITY): Payer: Self-pay

## 2024-07-17 NOTE — Telephone Encounter (Signed)
 Pharmacy Patient Advocate Encounter   Received notification from [facility] KEY that prior authorization for Voquezna  20MG  tablets is required/requested.   Insurance verification completed.   The patient is insured through [facility] MEDICARE.   Per test claim: PA required; PA submitted to above mentioned insurance via Latent Key/confirmation #/[id number] [id number] Status is pending

## 2024-07-19 ENCOUNTER — Ambulatory Visit: Payer: Self-pay | Admitting: Internal Medicine

## 2024-07-24 ENCOUNTER — Encounter: Payer: Self-pay | Admitting: Internal Medicine

## 2024-08-17 ENCOUNTER — Ambulatory Visit: Admitting: Internal Medicine

## 2024-08-20 ENCOUNTER — Ambulatory Visit: Admitting: Allergy

## 2024-10-20 ENCOUNTER — Ambulatory Visit
